# Patient Record
Sex: Female | Born: 1935
Health system: Southern US, Community
[De-identification: ages and names within clinical notes are randomized; demographics above are authoritative.]

## PROBLEM LIST (undated history)

## (undated) DIAGNOSIS — R569 Unspecified convulsions: Secondary | ICD-10-CM

## (undated) DIAGNOSIS — E7800 Pure hypercholesterolemia, unspecified: Secondary | ICD-10-CM

## (undated) HISTORY — PX: CYSTOSCOPY: SHX5120

## (undated) HISTORY — PX: ABDOMINAL HYSTERECTOMY: SHX81

## (undated) HISTORY — PX: BACK SURGERY: SHX140

## (undated) HISTORY — PX: HERNIA REPAIR: SHX51

## (undated) HISTORY — PX: CHOLECYSTECTOMY: SHX55

## (undated) HISTORY — PX: COLON RESECTION: SHX5231

---

## 2002-03-28 ENCOUNTER — Encounter: Payer: Self-pay | Admitting: Internal Medicine

## 2002-03-28 ENCOUNTER — Ambulatory Visit (HOSPITAL_COMMUNITY): Admission: RE | Admit: 2002-03-28 | Discharge: 2002-03-28 | Payer: Self-pay | Admitting: Internal Medicine

## 2002-04-12 ENCOUNTER — Ambulatory Visit (HOSPITAL_COMMUNITY): Admission: RE | Admit: 2002-04-12 | Discharge: 2002-04-12 | Payer: Self-pay | Admitting: Internal Medicine

## 2002-04-12 ENCOUNTER — Encounter: Payer: Self-pay | Admitting: Internal Medicine

## 2005-01-09 ENCOUNTER — Ambulatory Visit (HOSPITAL_COMMUNITY): Admission: RE | Admit: 2005-01-09 | Discharge: 2005-01-09 | Payer: Self-pay | Admitting: Family Medicine

## 2005-01-14 ENCOUNTER — Ambulatory Visit (HOSPITAL_COMMUNITY): Admission: RE | Admit: 2005-01-14 | Discharge: 2005-01-14 | Payer: Self-pay | Admitting: Family Medicine

## 2005-01-22 ENCOUNTER — Ambulatory Visit (HOSPITAL_COMMUNITY): Admission: RE | Admit: 2005-01-22 | Discharge: 2005-01-22 | Payer: Self-pay | Admitting: Family Medicine

## 2005-01-29 ENCOUNTER — Ambulatory Visit (HOSPITAL_COMMUNITY): Admission: RE | Admit: 2005-01-29 | Discharge: 2005-01-29 | Payer: Self-pay | Admitting: Family Medicine

## 2005-04-04 ENCOUNTER — Ambulatory Visit: Admission: RE | Admit: 2005-04-04 | Discharge: 2005-04-04 | Payer: Self-pay | Admitting: Gynecology

## 2005-04-11 ENCOUNTER — Ambulatory Visit (HOSPITAL_COMMUNITY): Admission: RE | Admit: 2005-04-11 | Discharge: 2005-04-11 | Payer: Self-pay | Admitting: Gynecology

## 2005-04-11 ENCOUNTER — Encounter (INDEPENDENT_AMBULATORY_CARE_PROVIDER_SITE_OTHER): Payer: Self-pay | Admitting: Specialist

## 2006-03-05 ENCOUNTER — Ambulatory Visit (HOSPITAL_COMMUNITY): Admission: RE | Admit: 2006-03-05 | Discharge: 2006-03-05 | Payer: Self-pay | Admitting: Family Medicine

## 2006-04-10 ENCOUNTER — Ambulatory Visit (HOSPITAL_COMMUNITY): Admission: RE | Admit: 2006-04-10 | Discharge: 2006-04-10 | Payer: Self-pay | Admitting: Neurological Surgery

## 2006-05-07 ENCOUNTER — Inpatient Hospital Stay (HOSPITAL_COMMUNITY): Admission: RE | Admit: 2006-05-07 | Discharge: 2006-05-15 | Payer: Self-pay | Admitting: Neurological Surgery

## 2007-04-05 ENCOUNTER — Ambulatory Visit (HOSPITAL_COMMUNITY): Admission: RE | Admit: 2007-04-05 | Discharge: 2007-04-05 | Payer: Self-pay | Admitting: Family Medicine

## 2008-04-06 ENCOUNTER — Ambulatory Visit (HOSPITAL_COMMUNITY): Admission: RE | Admit: 2008-04-06 | Discharge: 2008-04-06 | Payer: Self-pay | Admitting: Family Medicine

## 2008-07-13 IMAGING — CT CT L SPINE W/ CM
4 of 11 series · 12 of 35 positions shown, 13 images · IV contrast (omnipaque)
Comparison: none

CLINICAL DATA: Back pain and bilateral leg pain.
LUMBAR MYELOGRAM:
TECHNIQUE: Lumbar puncture was performed by Dr. Ferienhaus Erxleben.  L3-4 left paramedian approach, 22 gauge spinal needle.  15 cc Omnipaque 180.
TECHNIQUE: Multidetector CT imaging of the lumbar spine was performed after intrathecal injection of contrast.  Multiplanar CT image reconstructions were also generated.

[Series 2: l-spine helical · axial · 0.34mm/px · z∈[-297,-189]mm · 4 of 73 slices shown, 5 images]
[im 15/73  soft-tissue]
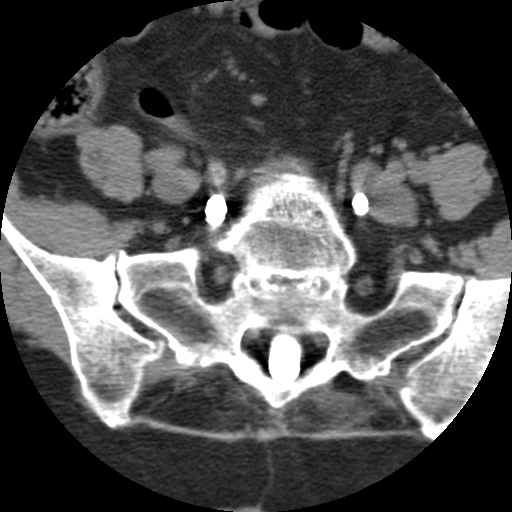
[im 15/73  bone]
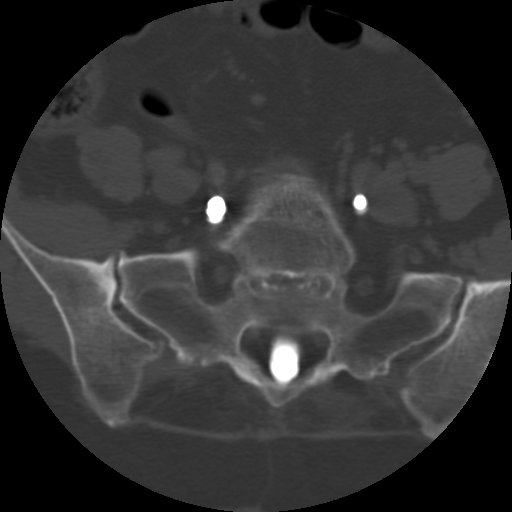
[im 29/73  bone]
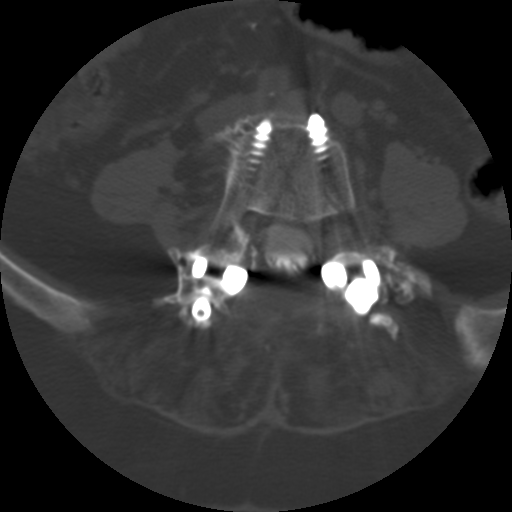
[im 44/73  bone]
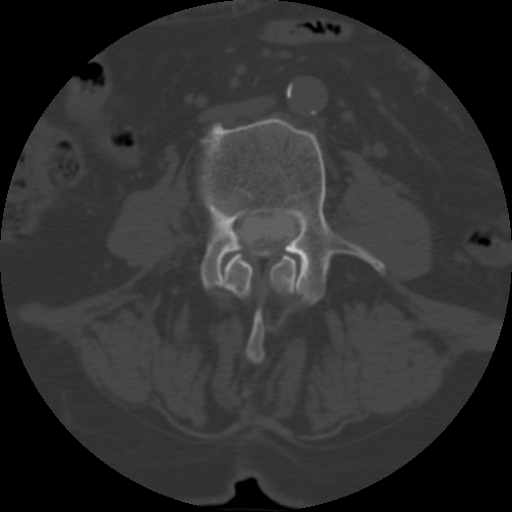
[im 58/73  bone]
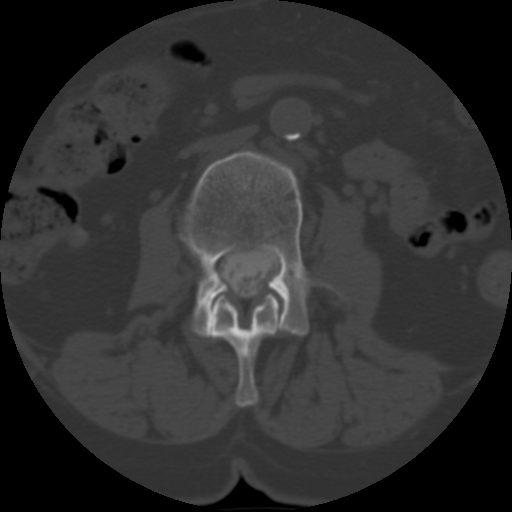

[Series 3: recon 2: l-spine helical · axial · 0.34mm/px · z∈[-287,-197]mm · 3 of 73 slices shown]
[im 19/73  bone]
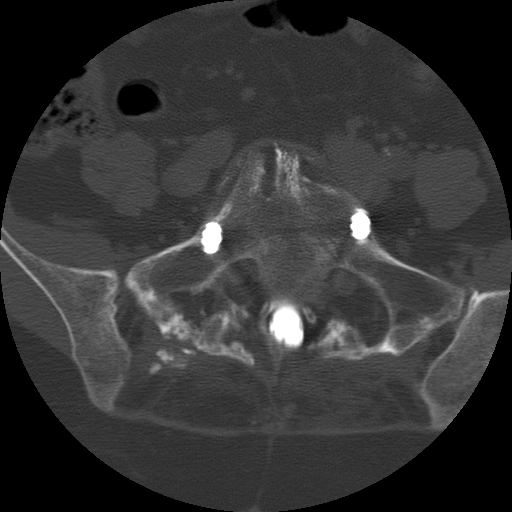
[im 37/73  bone]
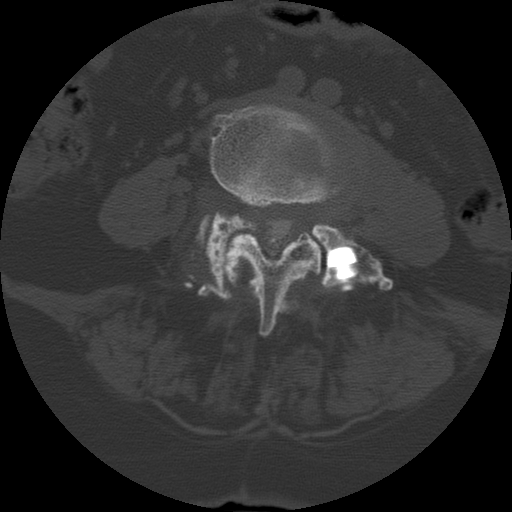
[im 55/73  bone]
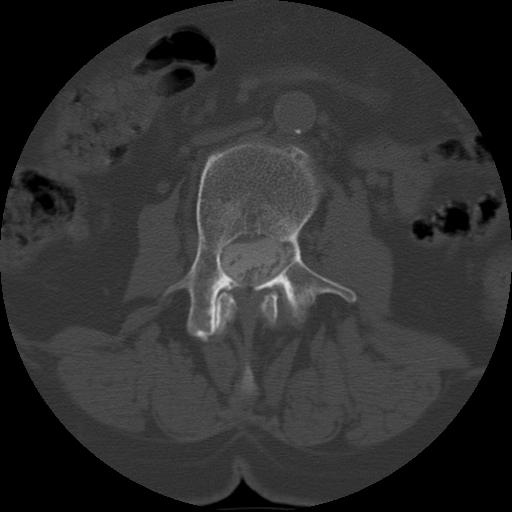

[Series 104: reformatted · coronal · 0.35mm/px · 2 of 50 slices shown (1 of 2)]
[im 11/50  soft-tissue]
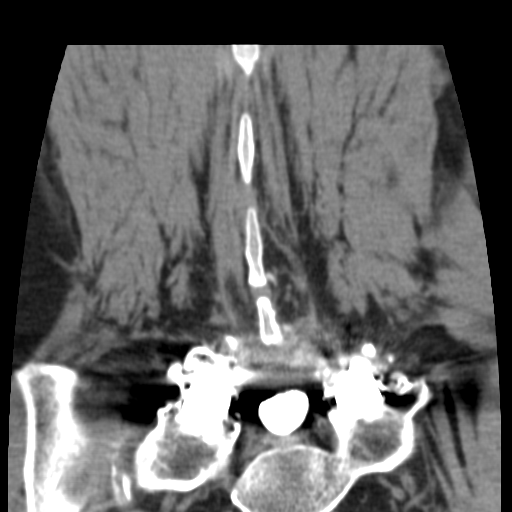
[im 25/50  bone]
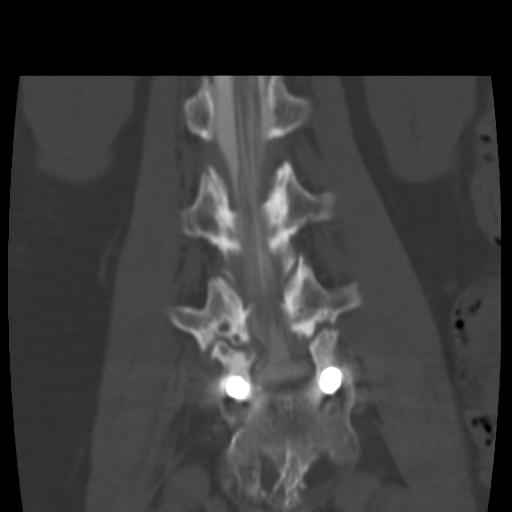

[Series 402: reformatted · coronal · 0.35mm/px · 3 of 56 slices shown (2 of 2)]
[im 3/56  bone]
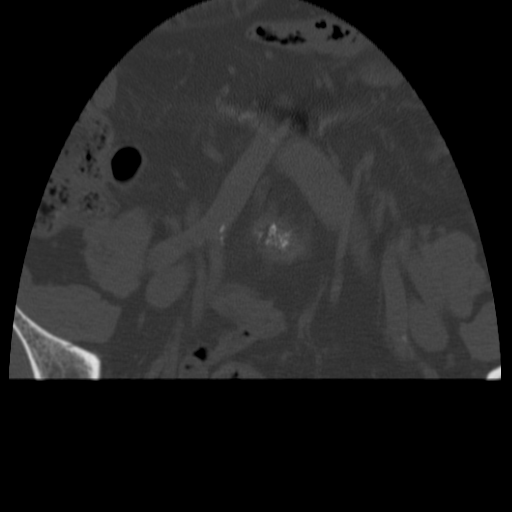
[im 16/56  bone]
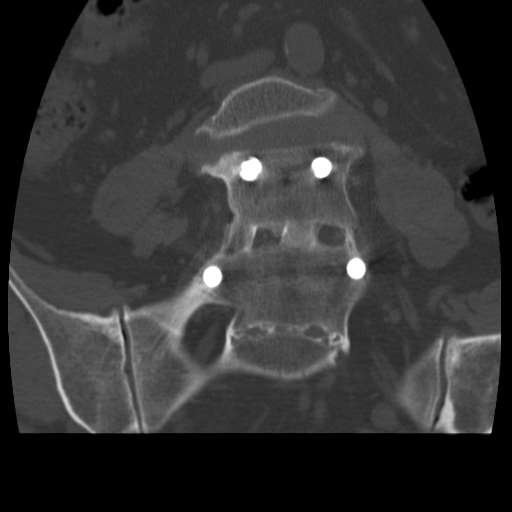
[im 30/56  bone]
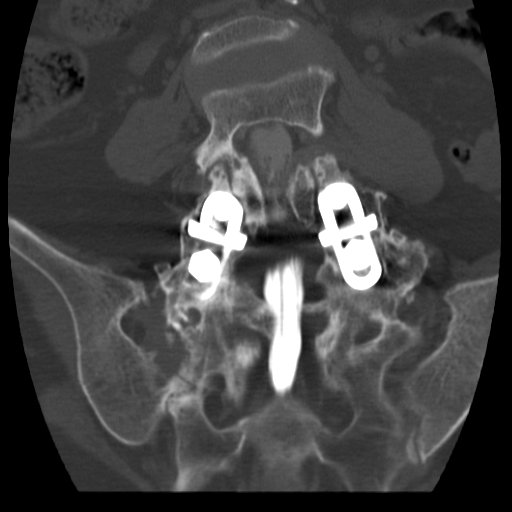

[12 of 35 positions shown; findings below may reference images not displayed]

FINDINGS: AP, lateral, and oblique views demonstrate previous L5-S1 fusion using pedicle screws and Le Fleur plates.  The hardware appears intact.  There is no evidence for nonunion.  
There is significant stenosis at L4-5 with bilateral L5 nerve root effacement.  This is due to a combination of mild slip as well as a large central disk protrusion.  Posterior element hypertrophy is prominently noted as well.  The disk spaces above this are normal.  There may be very mild anterolisthesis on forward flexion but there does not appear to be significant movement.
IMPRESSION: Solid fusion at L5-S1 with significant stenosis at the first motion segment above.
CT LUMBAR SPINE WITH CONTRAST (POST-MYELOGRAM):
FINDINGS: Transitional anatomy is present.  
L2-3:  The conus is slightly low.  Mild bulge.  
L3-4:  Mild bulge.  Mild facet arthropathy.
L4-5:  Central disk protrusion with posterior element hypertrophy.  Bilateral L4 and bilateral L5 nerve root encroachment are present.  
L5-S1:  Solid fusion.  No stenosis.  Pedicle screws are seen to extend throught the bone into the retroperitoneum.
IMPRESSION: 1.  Solid fusion at L5-S1.
2.  Central disk protrusion at L4-5 with posterior element hypertrophy; central canal stenosis is present along with bilateral L5 and bilateral L4 nerve root encroachment.

## 2008-07-13 IMAGING — RF DG MYELOGRAM LUMBAR
13 series · 13 of 13 positions shown · IV contrast (omnipaque)
Comparison: none

CLINICAL DATA: Back pain and bilateral leg pain.
LUMBAR MYELOGRAM:
TECHNIQUE: Lumbar puncture was performed by Dr. Ferienhaus Erxleben.  L3-4 left paramedian approach, 22 gauge spinal needle.  15 cc Omnipaque 180.
TECHNIQUE: Multidetector CT imaging of the lumbar spine was performed after intrathecal injection of contrast.  Multiplanar CT image reconstructions were also generated.

[Series 1: run · 1 of 1 slices shown (1 of 13)]
[im 1/1]
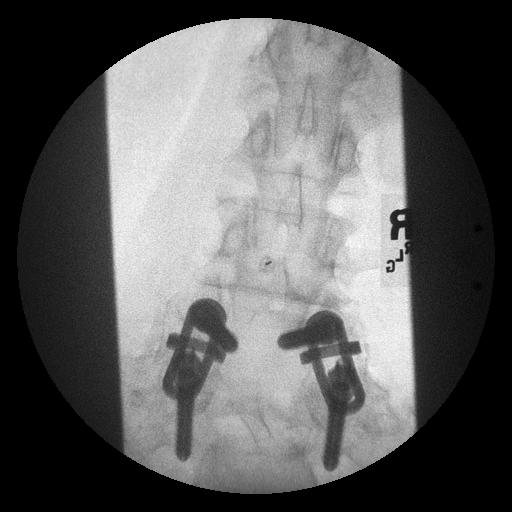

[Series 2: run · 1 of 1 slices shown (2 of 13)]
[im 1/1]
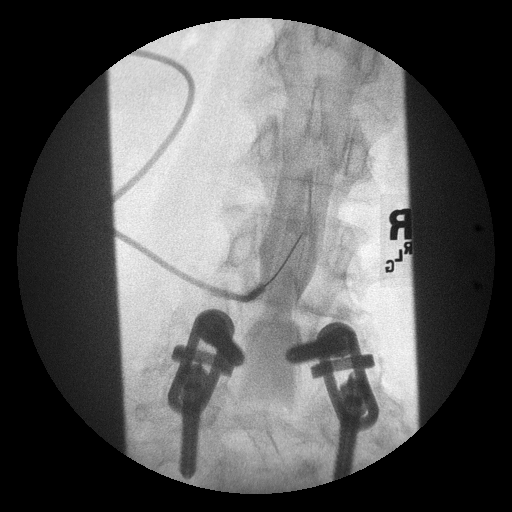

[Series 3: run · 1 of 1 slices shown (3 of 13)]
[im 1/1]
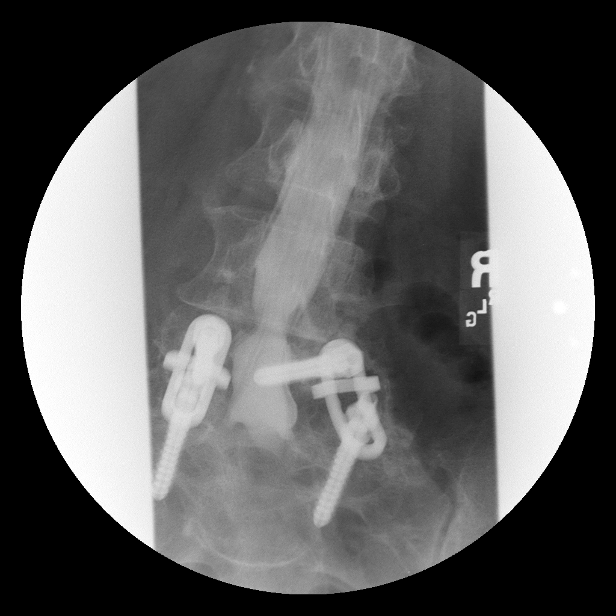

[Series 4: run · 1 of 1 slices shown (4 of 13)]
[im 1/1]
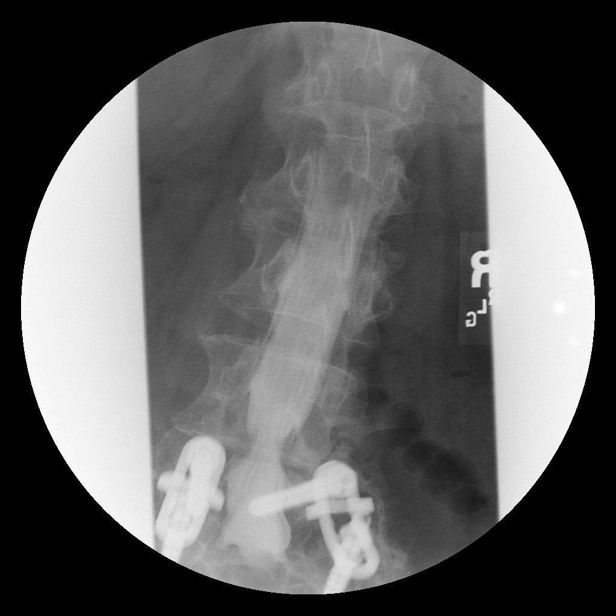

[Series 5: run · 1 of 1 slices shown (5 of 13)]
[im 1/1]
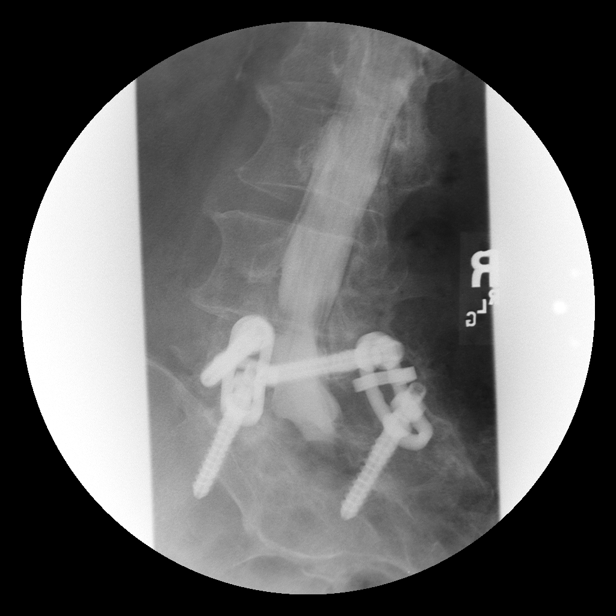

[Series 6: run · 1 of 1 slices shown (6 of 13)]
[im 1/1]
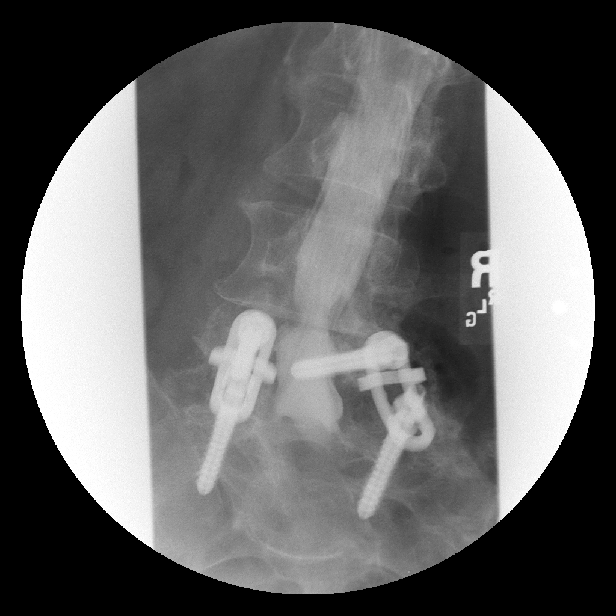

[Series 7: run · 1 of 1 slices shown (7 of 13)]
[im 1/1]
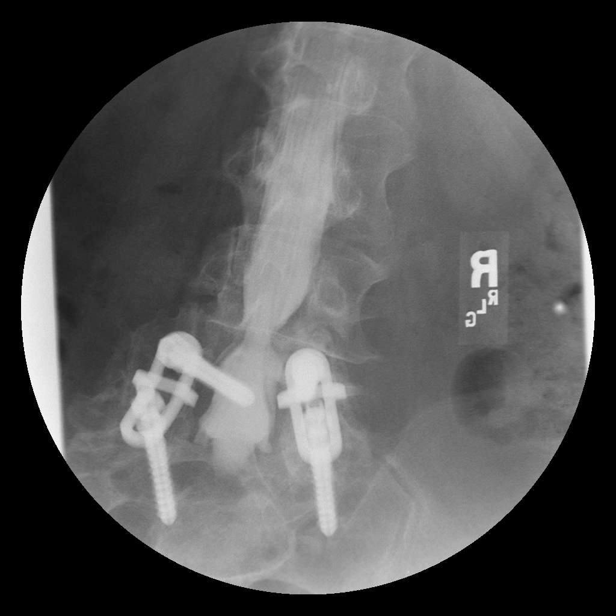

[Series 8: run · 1 of 1 slices shown (8 of 13)]
[im 1/1]
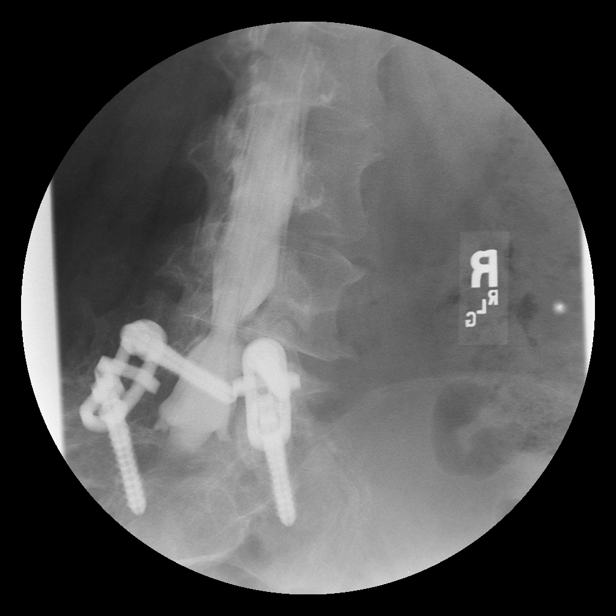

[Series 9: run · 1 of 1 slices shown (9 of 13)]
[im 1/1]
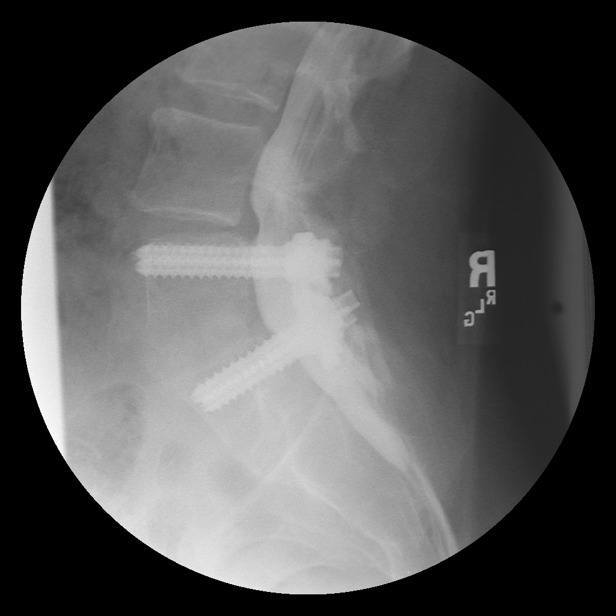

[Series 10: run · 1 of 1 slices shown (10 of 13)]
[im 1/1]
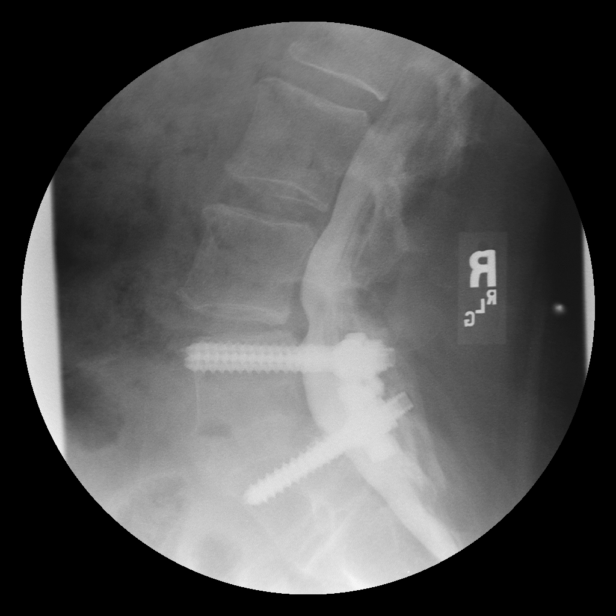

[Series 11: run · 1 of 1 slices shown (11 of 13)]
[im 1/1]
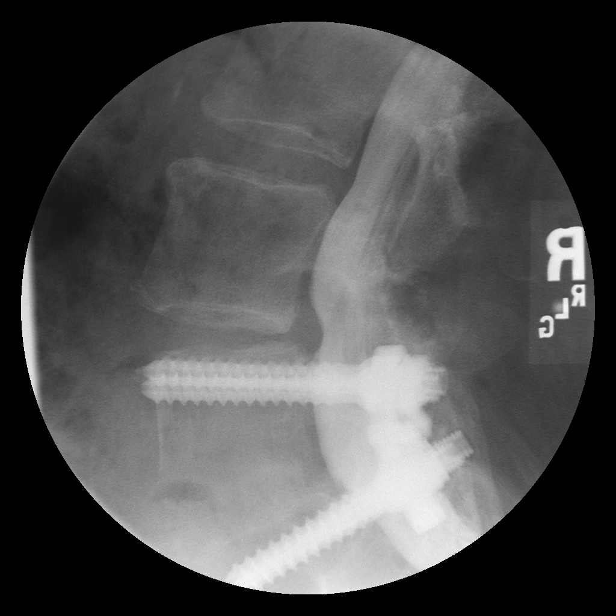

[Series 12: run · 1 of 1 slices shown (12 of 13)]
[im 1/1]
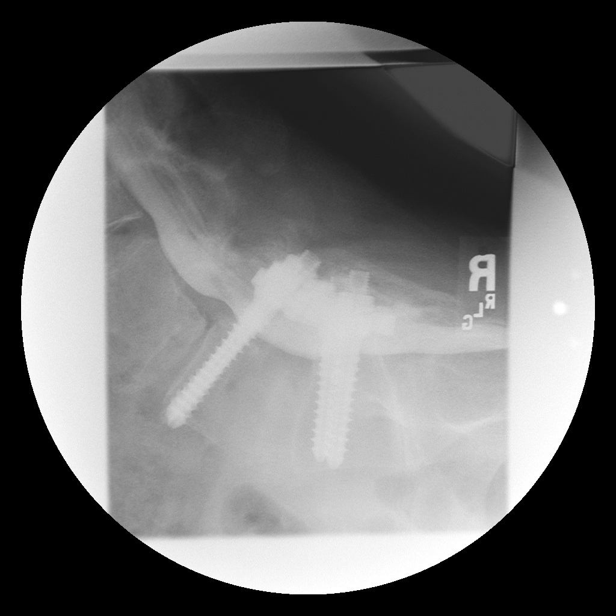

[Series 13: run · 1 of 1 slices shown (13 of 13)]
[im 1/1]
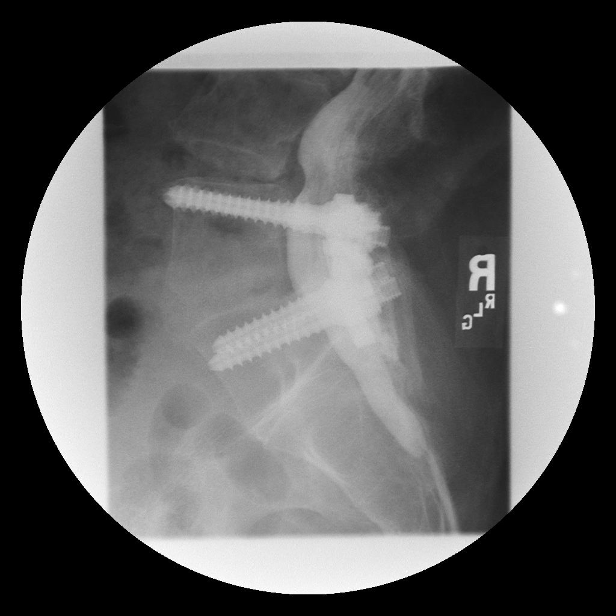

[13 of 13 positions shown; findings below may reference images not displayed]

FINDINGS: AP, lateral, and oblique views demonstrate previous L5-S1 fusion using pedicle screws and Le Fleur plates.  The hardware appears intact.  There is no evidence for nonunion.  
There is significant stenosis at L4-5 with bilateral L5 nerve root effacement.  This is due to a combination of mild slip as well as a large central disk protrusion.  Posterior element hypertrophy is prominently noted as well.  The disk spaces above this are normal.  There may be very mild anterolisthesis on forward flexion but there does not appear to be significant movement.
IMPRESSION: Solid fusion at L5-S1 with significant stenosis at the first motion segment above.
CT LUMBAR SPINE WITH CONTRAST (POST-MYELOGRAM):
FINDINGS: Transitional anatomy is present.  
L2-3:  The conus is slightly low.  Mild bulge.  
L3-4:  Mild bulge.  Mild facet arthropathy.
L4-5:  Central disk protrusion with posterior element hypertrophy.  Bilateral L4 and bilateral L5 nerve root encroachment are present.  
L5-S1:  Solid fusion.  No stenosis.  Pedicle screws are seen to extend throught the bone into the retroperitoneum.
IMPRESSION: 1.  Solid fusion at L5-S1.
2.  Central disk protrusion at L4-5 with posterior element hypertrophy; central canal stenosis is present along with bilateral L5 and bilateral L4 nerve root encroachment.

## 2009-05-11 ENCOUNTER — Ambulatory Visit (HOSPITAL_COMMUNITY): Admission: RE | Admit: 2009-05-11 | Discharge: 2009-05-11 | Payer: Self-pay | Admitting: Family Medicine

## 2010-04-25 ENCOUNTER — Ambulatory Visit (HOSPITAL_COMMUNITY)
Admission: RE | Admit: 2010-04-25 | Discharge: 2010-04-25 | Payer: Self-pay | Source: Home / Self Care | Admitting: Ophthalmology

## 2010-05-15 ENCOUNTER — Emergency Department (HOSPITAL_COMMUNITY)
Admission: RE | Admit: 2010-05-15 | Discharge: 2010-05-15 | Payer: Self-pay | Source: Home / Self Care | Attending: Ophthalmology | Admitting: Ophthalmology

## 2010-06-03 ENCOUNTER — Ambulatory Visit (HOSPITAL_COMMUNITY)
Admission: RE | Admit: 2010-06-03 | Discharge: 2010-06-03 | Payer: Self-pay | Source: Home / Self Care | Attending: Family Medicine | Admitting: Family Medicine

## 2010-06-26 ENCOUNTER — Encounter (HOSPITAL_COMMUNITY): Payer: Medicare Other | Attending: Ophthalmology

## 2010-06-27 ENCOUNTER — Ambulatory Visit (HOSPITAL_COMMUNITY)
Admission: RE | Admit: 2010-06-27 | Discharge: 2010-06-27 | Disposition: A | Discharge status: Home / Self Care | Payer: Medicare Other | Attending: Ophthalmology | Admitting: Ophthalmology

## 2010-06-27 DIAGNOSIS — Z7982 Long term (current) use of aspirin: Secondary | ICD-10-CM | POA: Insufficient documentation

## 2010-06-27 DIAGNOSIS — H2510 Age-related nuclear cataract, unspecified eye: Secondary | ICD-10-CM | POA: Insufficient documentation

## 2010-06-27 DIAGNOSIS — Z01810 Encounter for preprocedural cardiovascular examination: Secondary | ICD-10-CM | POA: Insufficient documentation

## 2010-08-05 LAB — DIFFERENTIAL
Eosinophils Absolute: 0.1 10*3/uL (ref 0.0–0.7)
Lymphocytes Relative: 24 % (ref 12–46)
Lymphs Abs: 1.5 10*3/uL (ref 0.7–4.0)
Monocytes Relative: 6 % (ref 3–12)
Neutro Abs: 4.1 10*3/uL (ref 1.7–7.7)
Neutrophils Relative %: 68 % (ref 43–77)

## 2010-08-05 LAB — BASIC METABOLIC PANEL
BUN: 12 mg/dL (ref 6–23)
Calcium: 9.4 mg/dL (ref 8.4–10.5)
Chloride: 104 mEq/L (ref 96–112)
Creatinine, Ser: 0.8 mg/dL (ref 0.4–1.2)
GFR calc non Af Amer: >60 mL/min (ref >60)
Glucose, Bld: 129 mg/dL — ABNORMAL HIGH (ref 70–99)
Sodium: 138 mEq/L (ref 135–145)

## 2010-08-05 LAB — CBC
Hemoglobin: 13.3 g/dL (ref 12.0–15.0)
MCV: 93.4 fL (ref 78.0–100.0)
Platelets: 220 10*3/uL (ref 150–400)
WBC: 6 10*3/uL (ref 4.0–10.5)

## 2010-08-05 LAB — POCT CARDIAC MARKERS
CKMB, poc: <1 ng/mL — ABNORMAL LOW (ref 1.0–8.0)
CKMB, poc: <1 ng/mL — ABNORMAL LOW (ref 1.0–8.0)
Myoglobin, poc: 75 ng/mL (ref 12–200)
Myoglobin, poc: 87.5 ng/mL (ref 12–200)
Troponin i, poc: <0.05 ng/mL (ref 0.00–0.09)

## 2010-08-06 LAB — HEMOGLOBIN AND HEMATOCRIT, BLOOD: HCT: 39.2 % (ref 36.0–46.0)

## 2010-08-06 LAB — BASIC METABOLIC PANEL
BUN: 15 mg/dL (ref 6–23)
CO2: 26 mEq/L (ref 19–32)
Calcium: 9.5 mg/dL (ref 8.4–10.5)
GFR calc non Af Amer: >60 mL/min (ref >60)
Glucose, Bld: 117 mg/dL — ABNORMAL HIGH (ref 70–99)
Sodium: 137 mEq/L (ref 135–145)

## 2010-10-11 NOTE — Consult Note (Signed)
NAME:  Gloria Owens, Gloria Owens NO.:  [id number]   MEDICAL RECORD NO.:  [id number]          PATIENT TYPE:  OUT   LOCATION:  GYN                          FACILITY:  [facility]   PHYSICIAN:  De Blanch, M.D.DATE OF BIRTH:  1935-07-31   DATE OF CONSULTATION:  04/04/2005  DATE OF DISCHARGE:                                   CONSULTATION   CHIEF COMPLAINT:  Pelvic cyst.   HISTORY OF PRESENT ILLNESS:  A 75 year old white married female seen in  consultation at the request of Dr. Marlane Hatcher regarding management  of a newly diagnosed pelvic cyst.  The cyst was discovered in the course of  performing an MRI to evaluate the patient's spine.  Further evaluation has  included a CAT scan which showed the cyst to be a simple fluid collection  measuring 9.8 x 5.5 x 9.2 cm.  There is no evidence of free fluid or  adenopathy.  It does cause a mass effect on the bladder, but is clearly  separate from the bladder.   The patient has a complex gynecologic history in that she underwent  exploratory laparotomy in 1967 and was found to have extensive  endometriosis.  The extent of that surgical procedure is unknown.  However,  in 1970 she underwent exploratory laparotomy with total abdominal  hysterectomy and a sigmoid resection with reanastomosis.  The patient did  not develop menopausal symptoms at that time (she was 76 years of age) and  therefore it would seem reasonable to assume that her ovaries were not  removed.  Operative notes and details of that surgical procedure are not  available.  She has previously had attempts at colonoscopy which have been  unsuccessful because of stenosis.  She claims that the sigmoid colon is no  larger than a English pea and therefore has been evaluated by barium  enemas only.   The patient does have chronic left lower quadrant pain and chronic  constipation.   She denies any hormone replacement therapy use or any other current or past  gynecologic symptoms.   PAST MEDICAL HISTORY:   MEDICAL ILLNESSES:  None.  She does have a remote history of epilepsy which  she said has resolved spontaneously.   PAST SURGICAL HISTORY:  1.  TAH 1970.  2.  Sigmoid resection with reanastomosis 1970.  3.  Open cholecystectomy 1973.  4.  She had bladder polyps which were cauterized.  5.  An inguinal hernia was repaired in 1993.  6.  She has had back surgery in 1995.   CURRENT MEDICATIONS:  Crestor and Vicodin.   ALLERGIES:  None.   FAMILY HISTORY:  Negative for gynecologic, breast, or colon cancer.  Several  of her brothers died of lung cancer but they were smokers.   SOCIAL HISTORY:  The patient has been married for 47 years.  She does not  smoke.  Previously worked in a [employer].   REVIEW OF SYSTEMS:  10-point comprehensive review of systems is negative  except for symptoms noted above.   PHYSICAL EXAMINATION:  VITAL SIGNS:  Height 5 feet 4  inches, weight 144  pounds, blood pressure 148/76, pulse 80.  GENERAL:  Patient is a healthy, pleasant white female in no acute distress.  HEENT:  Negative.  NECK:  Supple without thyromegaly.  LYMPH:  There is no supraclavicular or inguinal adenopathy.  ABDOMEN:  Soft and nontender.  She has a well healed midline and right  subcostal scars.  No masses or hernias, ascites, or organomegaly is noted.  PELVIC:  EGBUS, vagina, bladder, urethra are normal, slightly atrophic.  No  lesions are noted.  Bimanual and rectovaginal examination reveals some  slight tenderness without any discrete masses.   The patient's MRI and CT scan are reviewed and are consistent with the  reports noted in my history of present illness.   IMPRESSION:  Large simple cyst of the pelvis most likely a peritoneal  inclusion cyst given the extent of her prior pelvic operations.   Apparent sigmoid stenosis which may be contributing to her left lower  quadrant pain.   I had a lengthy discussion with the patient  and her husband regarding the  differential diagnosis of this mass.  Not only it is simple but we are  reassured further because her CA-125 value was 8.3 units/mL.   Options that were discussed included observation versus percutaneous  drainage versus laparotomy.  I do not think laparotomy is a good choice  given the low likelihood of this being a significant problem and the high  probability of extensive adhesions and potential for serious postoperative  complications.  After discussing observation versus aspiration the patient  wishes to go ahead with interventional radiology draining this cyst.  We  will send for fluid for  cytopathology.  The patient is informed that while the cyst may diminish in  size after drainage it may likely reoccur.  If there is any evidence of  malignancy, certainly surgical intervention would be reconsidered.   We will arrange for her to have fine needle aspiration of this cyst next  week.      De Blanch, M.D.  Electronically Signed     DC/MEDQ  D:  04/04/2005  T:  04/04/2005  Job:  [id number]   cc:   Melvyn Novas, MD  Fax: [phone]   Telford Nab, R.N.  [address]  [place], [place] [address]   Barbaraann Barthel, M.D.  Fax: [phone]

## 2010-10-11 NOTE — Op Note (Signed)
NAME:  Gloria, Owens NO.:  [id number]   MEDICAL RECORD NO.:  [id number]          PATIENT TYPE:  INP   LOCATION:  3105                         FACILITY:  [facility]   PHYSICIAN:  Stefani Dama, M.D.  DATE OF BIRTH:  07-Sep-1935   DATE OF PROCEDURE:  05/07/2006  DATE OF DISCHARGE:                               OPERATIVE REPORT   PREOPERATIVE DIAGNOSIS:  Spondylosis L4-L5 with lumbar stenosis, lumbar  radiculopathy, neurogenic claudication, status post arthrodesis L5-S1.   POSTOPERATIVE DIAGNOSIS:  Spondylosis L4-L5 with lumbar stenosis, lumbar  radiculopathy, neurogenic claudication, status post arthrodesis L5-S1.   PROCEDURE:  Removal of hardware L5-S1, laminectomy of L4, posterior  lumbar interbody arthrodesis after bilateral discectomy L4-L5, PEEK  spacers, local autograft and allograft arthrodesis, pedicle screw  fixation L4 to L5 with posterolateral arthrodesis, local autograft and  allograft.   SURGEON:  Stefani Dama, M.D.   FIRST ASSISTANT:  Cristi Loron, M.D.   ANESTHESIA:  General endotracheal anesthesia.   INDICATIONS:  Gloria Owens is a 75 year old individual who has had  previous problems with significant back pain and lower extremity pain.  She had undergone an arthrodesis at L5-S1 in the past and did well for a  number of years, but more recently developed bilateral lower extremity  pain. On a post myelogram CAT scan, she was noted to have severe  bilateral lateral compression of the exiting L4 nerve root superiorly  secondary to spondylosis and overgrowth of the facets at the L4-L5  joint.  Because she has failed efforts at conservative management, now  for a long period of time, she was advised regarding surgical  decompression and arthrodesis for removal of the previously placed  hardware.  She is taken to the operating room to undergo this procedure.   DESCRIPTION OF PROCEDURE:  The patient was brought to the operating room  supine  on the stretcher. After the smooth induction of general  endotracheal anesthesia, a Foley catheter was placed.  Care was taken to  turn her prone and the bony prominences were appropriately padded and  protected.  An elliptical incision was made in the midline after  prepping the skin with DuraPrep and draping sterilely.  A scar from the  previous surgery was removed.  Dissection was carried down to the  lumbodorsal fascia.  The fascia was opened on either side of the midline  to expose the spinous process of L4 and then dissection was carried down  in the posterolateral gutters to expose the L5-S1 hardware.  The  hardware was uncovered and it was noted to be consistent with hardware  that had been used some time ago.  Her previous fusion was done 1995.  The hardware was exposed very meticulously as bone had grown all around  and over the hardware, that required use of an osteotome to remove  fragments of bone from the screw heads and to expose the screws and the  lateral rods and plates adequately. Once this was achieved, the screw  nesting nuts could be loosened and the hardware was removed.   Attention was then turned  to L4 where a laminectomy of L4 was  undertaken.  This exposed the common dural tube. The laminectomy was  taken out laterally to expose the facet joints and a complete  facetectomy of the inferior facet of L4 was performed.  This exposed the  lateral aspect of the common dural tube and takeoff of the L5 nerve root  inferiorly and the L4 nerve root superiorly.  There was noted to be a  substantial amount of thickened grumous material in this area  compressing the L4 nerve roots.  This was particularly worse on the  right side than it was on the left. With these nerve roots being  decompressed, the epidural space was dissected with bipolar cautery  being used to cauterize the epidural veins. This allowed for retraction  of the common dural tube.  The dissection was then  performed so as to  retract the common dural tube and the L5 nerve root inferiorly.  The  disc space was exposed and a 15 blade was used to incise the disc space.  A combination of Kerrison rongeurs was used to evacuate a significant  quantity of severely degenerated disc material and a series of disc  shapers was then used in the L4-L5 interspace to remove cartilaginous  endplates. This was first done on the right side then done also on the  left side.  This space was completely evacuated of all degenerated  cartilaginous material and the endplates were shaved completely after  being spread to 9 mm size. 10 mm PEEK interbody spacers were then filled  with bone graft. Bone graft was then packed into the interbody space.  The bone graft consisted of the autograft from the laminectomy which was  mixed with 5 mL of Osteocel and also 10 mL of Alphagrain was used as a  bone expander.  An aspirate of the bone marrow from the pedicle of L4 on  that left side was obtained and this was used to mix the Alphagrain.  This material was placed in the interspace at L4-L5 and the PEEK spacer  was placed, first on the right then on the left. The remainder of the  disc space was then packed with the additional bone.  The lateral  gutters were then decorticated from the intertransverse space at L4 and  L5.  This was packed with the remainder of the autologous bone.  The  previously placed L5 screws were then used to place 7.5 x 45 mm pedicle  screws on the left then on the right. The pedicles of L4 were noted to  be rather small, 5.5 x 45 mm screws were placed here using fluoroscopic  guidance. The screws were connected with short 35 mm pre-contoured rods,  slight bit of extension was placed on the construct.  With the  posterolateral gutters being filled with bone graft, hemostasis in the  soft tissues was obtained meticulously and then the lumbodorsal fascia was reapproximated with #1 Vicryl in an  interrupted fashion, 2-0 Vicryl  was used in the subcutaneous tissues, 3-0 Vicryl subcuticularly.  Dry  sterile dressing was placed on the patient's back.  Blood loss for this  procedure was estimated at about 900 mL.  300 mL of Cell Saver blood was  returned to the patient.      Stefani Dama, M.D.  Electronically Signed     HJE/MEDQ  D:  05/07/2006  T:  05/07/2006  Job:  [id number]

## 2011-06-17 ENCOUNTER — Other Ambulatory Visit (HOSPITAL_COMMUNITY): Payer: Self-pay | Admitting: Family Medicine

## 2011-06-17 DIAGNOSIS — Z139 Encounter for screening, unspecified: Secondary | ICD-10-CM

## 2011-06-23 ENCOUNTER — Ambulatory Visit (HOSPITAL_COMMUNITY)
Admission: RE | Admit: 2011-06-23 | Discharge: 2011-06-23 | Disposition: A | Discharge status: Home / Self Care | Payer: Medicare Other | Source: Ambulatory Visit | Attending: Family Medicine | Admitting: Family Medicine

## 2011-06-23 DIAGNOSIS — Z1231 Encounter for screening mammogram for malignant neoplasm of breast: Secondary | ICD-10-CM | POA: Insufficient documentation

## 2011-06-23 DIAGNOSIS — Z139 Encounter for screening, unspecified: Secondary | ICD-10-CM

## 2011-06-27 ENCOUNTER — Other Ambulatory Visit: Payer: Self-pay | Admitting: Physician Assistant

## 2011-06-27 DIAGNOSIS — R928 Other abnormal and inconclusive findings on diagnostic imaging of breast: Secondary | ICD-10-CM

## 2011-07-09 ENCOUNTER — Other Ambulatory Visit: Payer: Self-pay | Admitting: Physician Assistant

## 2011-07-09 ENCOUNTER — Ambulatory Visit (HOSPITAL_COMMUNITY)
Admission: RE | Admit: 2011-07-09 | Discharge: 2011-07-09 | Disposition: A | Discharge status: Home / Self Care | Payer: Medicare Other | Source: Ambulatory Visit | Attending: Physician Assistant | Admitting: Physician Assistant

## 2011-07-09 DIAGNOSIS — R928 Other abnormal and inconclusive findings on diagnostic imaging of breast: Secondary | ICD-10-CM | POA: Insufficient documentation

## 2011-07-09 DIAGNOSIS — N6320 Unspecified lump in the left breast, unspecified quadrant: Secondary | ICD-10-CM

## 2011-07-16 ENCOUNTER — Other Ambulatory Visit: Payer: Self-pay | Admitting: Physician Assistant

## 2011-07-16 ENCOUNTER — Ambulatory Visit (HOSPITAL_COMMUNITY)
Admission: RE | Admit: 2011-07-16 | Discharge: 2011-07-16 | Disposition: A | Discharge status: Home / Self Care | Payer: Medicare Other | Source: Ambulatory Visit | Attending: Physician Assistant | Admitting: Physician Assistant

## 2011-07-16 DIAGNOSIS — N6009 Solitary cyst of unspecified breast: Secondary | ICD-10-CM | POA: Insufficient documentation

## 2011-07-16 DIAGNOSIS — N6320 Unspecified lump in the left breast, unspecified quadrant: Secondary | ICD-10-CM

## 2011-07-16 NOTE — Discharge Instructions (Signed)
Breast Biopsy WHY YOU NEED A BIOPSY Your caregiver has recommended that you have a breast tissue sample taken (biopsy). This is done to be certain that the lump or abnormality found in your breast is not cancerous (malignant). During a biopsy, a small piece of tissue is removed, so it can be examined under a microscope by a specialist (pathologist) who looks at tissues and cells and diagnoses abnormalities in them. Most lumps (tumors) or abnormalities, on or in the breast, are not cancerous (benign). However, biopsies are taken when your caregiver cannot be absolutely certain of what is wrong only from doing a physical exam, mammogram (breast X-ray), or other studies. A breast biopsy can tell you whether nothing more needs to be done, or you need more surgery or another type of treatment. A biopsy is done when there is:  Any undiagnosed breast mass.   Nipple abnormalities, dimpling, crusting, or ulcerations.   Calcium deposits (calcifications) or abnormalities seen on your mammogram, ultrasound, or MRI.   Suspicious changes in the breast (thickening, asymmetry) seen on mammogram.   Abnormal discharge from the nipple, especially blood.   Redness, swelling, and pain of the breast.  HOW A BIOPSY IS PERFORMED A biopsy is often performed on an outpatient basis (you go home the same day). This can be done in a hospital, clinic, or surgical center. Tissue samples (biopsies) are often done under local anesthesia (area is numbed). Sometimes general anesthetics are required, in which case you sleep through the procedure. Biopsies may remove the entire lump, a small piece of the lump, or a small sliver of tissue removed by needle. TYPES OF BREAST BIOPSY  Fine needle aspiration. A thin needle is placed through the skin, to the lump or cyst, and cells are removed.   Core needle biopsy. A large needle with a special tip is placed through the skin, to the abnormality, and a piece of tissue is removed.    Stereotactic biopsy. A core needle with a special X-ray is used, to direct the needle to the lump or abnormal area, which is difficult to feel or cannot be felt.   Vacuum-assisted biopsy. A hollow probe and a gentle vacuum remove a sample of tissue.   Ultrasound guided core needle biopsy. You lie on your stomach, with your breast through an opening, and a high frequency ultrasound helps guide the needle to the area of the abnormality.   Open biopsy. An incision is made in the breast, and a piece of the lump or the whole lump is removed.  LET YOUR CAREGIVER KNOW ABOUT:  Allergies.   Medicines taken, including herbs, eye drops, over-the-counter medicines, and creams.   Use of steroids (by mouth or creams).   Previous problems with anesthetics or Novocaine.   If you are taking aspirin or blood thinners.   Possibility of pregnancy, if this applies.   History of blood clots (thrombophlebitis).   History of bleeding or blood problems.   Previous surgery.   Other health problems.  RISKS AND COMPLICATIONS   Bleeding.   Infection.   Allergy to medicines.   Bruising and swelling of the breast.   Alteration in the shape of the breast.   Not finding the lump or abnormality.   Needing more surgery.  BEFORE THE PROCEDURE  You should arrive 60 minutes prior to your procedure or as directed.   Check-in at the admissions desk, to fill out necessary forms, if you are not preregistered.   There will be consent forms   to sign, prior to the procedure.   There is a waiting area for your family, while you are having your biopsy.   Try to have someone with you, to drive you home.   Do not smoke for 2 weeks before the surgery.   Let your caregiver know if you develop a cold or an infection.   Do not drink alcohol for at least 24 hours before surgery.   Wear a good support bra to the surgery.  AFTER THE PROCEDURE  After surgery, you will be taken to the recovery area, where a  nurse will watch and check your progress. Once you are awake, stable, and taking fluids well, if there are no other problems, you will be allowed to go home.   Ice packs applied to your operative site may help with discomfort and keep the swelling down.   You may resume normal diet and activities as directed. Avoid strenuous activities affecting the arm on the side of the biopsy, such as tennis, swimming, heavy lifting (more than 10 pounds) or pulling.   Bruising in the breast is normal following this procedure.   Wearing a support bra, even to bed, may be more comfortable. The bra will also help keep the dressing on.   Change dressings as directed.   Your doctor may apply a pressure dressing on your breast for 24 to 48 hours.   Only take over-the-counter or prescription medicines for pain, discomfort, or fever as directed by your caregiver.   Do not take aspirin, because it can cause bleeding.  HOME CARE INSTRUCTIONS   You may resume your usual diet.   Have someone drive you home after the surgery.   Do not do any exercise, driving, lifting or general activities without your caregiver's permission.   Take medicines and over-the-counter medicines, as ordered by your caregiver.   Keep your postoperative appointments as recommended.   Do not drink alcohol while taking pain medicine.  Finding out the results of your test Not all test results are available during your visit. If your test results are not back during the visit, make an appointment with your caregiver to find out the results. Do not assume everything is normal if you have not heard from your caregiver or the medical facility. It is important for you to follow up on all of your test results.  SEEK MEDICAL CARE IF:   You notice redness, swelling, or increasing pain in the wound.   You notice a bad smell coming from the wound or dressing.   You develop a rash.   You need stronger pain medicine.   You are having an  allergic reaction or problems with your medicines.  SEEK IMMEDIATE MEDICAL CARE IF:   You have difficulty breathing.   You have a fever.   There is increased bleeding (more than a small spot) from the wound.   Pus is coming from the wound.   The wound is breaking open.  Document Released: 05/12/2005 Document Revised: 01/22/2011 Document Reviewed: 03/30/2009 Exit[employer] Patient Information 2012 ExitCare, LLCBreast Biopsy WHY YOU NEED A BIOPSY Your caregiver has recommended that you have a breast tissue sample taken (biopsy). This is done to be certain that the lump or abnormality found in your breast is not cancerous (malignant). During a biopsy, a small piece of tissue is removed, so it can be examined under a microscope by a specialist (pathologist) who looks at tissues and cells and diagnoses abnormalities in them. Most lumps (tumors) or  abnormalities, on or in the breast, are not cancerous (benign). However, biopsies are taken when your caregiver cannot be absolutely certain of what is wrong only from doing a physical exam, mammogram (breast X-ray), or other studies. A breast biopsy can tell you whether nothing more needs to be done, or you need more surgery or another type of treatment. A biopsy is done when there is:  Any undiagnosed breast mass.   Nipple abnormalities, dimpling, crusting, or ulcerations.   Calcium deposits (calcifications) or abnormalities seen on your mammogram, ultrasound, or MRI.   Suspicious changes in the breast (thickening, asymmetry) seen on mammogram.   Abnormal discharge from the nipple, especially blood.   Redness, swelling, and pain of the breast.  HOW A BIOPSY IS PERFORMED A biopsy is often performed on an outpatient basis (you go home the same day). This can be done in a hospital, clinic, or surgical center. Tissue samples (biopsies) are often done under local anesthesia (area is numbed). Sometimes general anesthetics are required, in which case you  sleep through the procedure. Biopsies may remove the entire lump, a small piece of the lump, or a small sliver of tissue removed by needle. TYPES OF BREAST BIOPSY  Fine needle aspiration. A thin needle is placed through the skin, to the lump or cyst, and cells are removed.   Core needle biopsy. A large needle with a special tip is placed through the skin, to the abnormality, and a piece of tissue is removed.   Stereotactic biopsy. A core needle with a special X-ray is used, to direct the needle to the lump or abnormal area, which is difficult to feel or cannot be felt.   Vacuum-assisted biopsy. A hollow probe and a gentle vacuum remove a sample of tissue.   Ultrasound guided core needle biopsy. You lie on your stomach, with your breast through an opening, and a high frequency ultrasound helps guide the needle to the area of the abnormality.   Open biopsy. An incision is made in the breast, and a piece of the lump or the whole lump is removed.  LET YOUR CAREGIVER KNOW ABOUT:  Allergies.   Medicines taken, including herbs, eye drops, over-the-counter medicines, and creams.   Use of steroids (by mouth or creams).   Previous problems with anesthetics or Novocaine.   If you are taking aspirin or blood thinners.   Possibility of pregnancy, if this applies.   History of blood clots (thrombophlebitis).   History of bleeding or blood problems.   Previous surgery.   Other health problems.  RISKS AND COMPLICATIONS   Bleeding.   Infection.   Allergy to medicines.   Bruising and swelling of the breast.   Alteration in the shape of the breast.   Not finding the lump or abnormality.   Needing more surgery.  BEFORE THE PROCEDURE  You should arrive 60 minutes prior to your procedure or as directed.   Check-in at the admissions desk, to fill out necessary forms, if you are not preregistered.   There will be consent forms to sign, prior to the procedure.   There is a waiting  area for your family, while you are having your biopsy.   Try to have someone with you, to drive you home.   Do not smoke for 2 weeks before the surgery.   Let your caregiver know if you develop a cold or an infection.   Do not drink alcohol for at least 24 hours before surgery.   Wear   a good support bra to the surgery.  AFTER THE PROCEDURE  After surgery, you will be taken to the recovery area, where a nurse will watch and check your progress. Once you are awake, stable, and taking fluids well, if there are no other problems, you will be allowed to go home.   Ice packs applied to your operative site may help with discomfort and keep the swelling down.   You may resume normal diet and activities as directed. Avoid strenuous activities affecting the arm on the side of the biopsy, such as tennis, swimming, heavy lifting (more than 10 pounds) or pulling.   Bruising in the breast is normal following this procedure.   Wearing a support bra, even to bed, may be more comfortable. The bra will also help keep the dressing on.   Change dressings as directed.   Your doctor may apply a pressure dressing on your breast for 24 to 48 hours.   Only take over-the-counter or prescription medicines for pain, discomfort, or fever as directed by your caregiver.   Do not take aspirin, because it can cause bleeding.  HOME CARE INSTRUCTIONS   You may resume your usual diet.   Have someone drive you home after the surgery.   Do not do any exercise, driving, lifting or general activities without your caregiver's permission.   Take medicines and over-the-counter medicines, as ordered by your caregiver.   Keep your postoperative appointments as recommended.   Do not drink alcohol while taking pain medicine.  Finding out the results of your test Not all test results are available during your visit. If your test results are not back during the visit, make an appointment with your caregiver to find out  the results. Do not assume everything is normal if you have not heard from your caregiver or the medical facility. It is important for you to follow up on all of your test results.  SEEK MEDICAL CARE IF:   You notice redness, swelling, or increasing pain in the wound.   You notice a bad smell coming from the wound or dressing.   You develop a rash.   You need stronger pain medicine.   You are having an allergic reaction or problems with your medicines.  SEEK IMMEDIATE MEDICAL CARE IF:   You have difficulty breathing.   You have a fever.   There is increased bleeding (more than a small spot) from the wound.   Pus is coming from the wound.   The wound is breaking open.  Document Released: 05/12/2005 Document Revised: 01/22/2011 Document Reviewed: 03/30/2009 ExitCare Patient Information 2012 ExitCare, LLCFine Needle Aspiration Fine needle aspiration is a procedure used to remove a piece of tissue. The tissue may be removed from a swelling or abnormal growth (tumor). It is also used to confirm a cyst. A cyst is a fluid filled sac. The procedure may be done by your caregiver or a specialist. LET YOUR CAREGIVER KNOW ABOUT:   Any medications you take, especially blood thinners like aspirin.   Any problems you may have had with similar procedures in the past.  RISKS AND COMPLICATIONS This is a safe procedure. There is a very small risk of infection and or bleeding. Other complications can occur if the aspiration site is deep in the body. Your caregiver or specialist will explain this to you.  BEFORE THE PROCEDURE   No special preparation is needed in most cases. Your caregiver will let you know if there are special requirements, such  as an empty stomach.   Be sure to ask your caregiver any questions before the procedure starts.  PROCEDURE   This procedure is done under local or no anesthetic.   The skin is cleaned carefully.   A thin needle is directed into a lump. The needle is  directed in several different directions into the lump while suction is applied to the needle. When samples are removed, the needle is withdrawn.   The contents obtained are placed on a slide. They are then fixed, stained and examined under the microscope. The slide is examined by a specialist in the examination of tissue (pathologist).   A diagnosis can then be made. The pathologist will decide if the specimen is cancerous (malignant) or not cancerous (benign). If fluid is taken from a cyst, cells from the fluid can be examined. If no material is obtained from a fine needle aspiration, the sample may not rule out a problem. Sometimes the procedure is done again. The pathologist may need several days before a result is available.  AFTER THE PROCEDURE   There are usually no limits on diet or activity after a fine needle aspiration.   Your caregiver may give you instructions regarding the aspiration site. This will include information about keeping the site clean and dry. Follow these instructions carefully.   Call for your test results as instructed by your caregiver. Remember, it is your responsibility to get the results of your testing. Do not think everything is fine if you have not heard from your caregiver.   Keep a close watch on the aspiration site. Report any redness, swelling or drainage.   You should not have much pain at the aspiration site.   Take any medications as told by your caregiver.  SEEK MEDICAL CARE IF:   You have pain or drainage at the aspiration site that does not go away.   Swelling at the aspiration site does not gradually go away.  SEEK IMMEDIATE MEDICAL CARE IF:   You develop a fever a day or two after the aspiration.   You develop severe pain at the aspiration site.   You develop a warm, tender swelling at the aspiration site.  Document Released: 05/09/2000 Document Revised: 01/22/2011 Document Reviewed: 01/21/2008 [facility] Patient Information 2012  [place], [place].Marland KitchenMarland Kitchen

## 2011-07-16 NOTE — Progress Notes (Signed)
Lidocaine 2%   3ml injected    Needle core biopsy performed by Dr Deboraha Sprang

## 2011-07-17 ENCOUNTER — Other Ambulatory Visit: Payer: Self-pay | Admitting: Physician Assistant

## 2011-07-17 ENCOUNTER — Other Ambulatory Visit (HOSPITAL_COMMUNITY): Payer: Self-pay | Admitting: Family Medicine

## 2012-06-28 ENCOUNTER — Other Ambulatory Visit (HOSPITAL_COMMUNITY): Payer: Self-pay | Admitting: Family Medicine

## 2012-06-28 DIAGNOSIS — Z139 Encounter for screening, unspecified: Secondary | ICD-10-CM

## 2012-07-12 ENCOUNTER — Ambulatory Visit (HOSPITAL_COMMUNITY)
Admission: RE | Admit: 2012-07-12 | Discharge: 2012-07-12 | Disposition: A | Discharge status: Home / Self Care | Payer: Medicare Other | Source: Ambulatory Visit | Attending: Family Medicine | Admitting: Family Medicine

## 2012-07-12 DIAGNOSIS — Z139 Encounter for screening, unspecified: Secondary | ICD-10-CM

## 2012-07-12 DIAGNOSIS — Z1231 Encounter for screening mammogram for malignant neoplasm of breast: Secondary | ICD-10-CM | POA: Insufficient documentation

## 2013-03-08 ENCOUNTER — Ambulatory Visit (HOSPITAL_COMMUNITY)
Admission: RE | Admit: 2013-03-08 | Discharge: 2013-03-08 | Disposition: A | Discharge status: Home / Self Care | Payer: Medicare Other | Source: Ambulatory Visit | Attending: General Surgery | Admitting: General Surgery

## 2013-03-08 ENCOUNTER — Other Ambulatory Visit (HOSPITAL_COMMUNITY): Payer: Self-pay | Admitting: General Surgery

## 2013-03-08 DIAGNOSIS — D3000 Benign neoplasm of unspecified kidney: Secondary | ICD-10-CM | POA: Diagnosis not present

## 2013-03-08 DIAGNOSIS — K469 Unspecified abdominal hernia without obstruction or gangrene: Secondary | ICD-10-CM

## 2013-03-08 DIAGNOSIS — K4090 Unilateral inguinal hernia, without obstruction or gangrene, not specified as recurrent: Secondary | ICD-10-CM | POA: Insufficient documentation

## 2013-06-08 ENCOUNTER — Encounter (HOSPITAL_COMMUNITY): Payer: Self-pay | Admitting: Pharmacy Technician

## 2013-06-08 NOTE — H&P (Signed)
Gloria Owens is an 78 y.o. female.   Chief Complaint: *Left inguinal hernia** HPI: *Patient is a 78yo wf who presents with a symptomatic left inguinal hernia.  Has been present for some time, but is increasing in size and causing her discomfort.**  No past medical history on file.  No past surgical history on file.  No family history on file. Social History:  has no tobacco, alcohol, and drug history on file.  Allergies: No Known Allergies  No prescriptions prior to admission    No results found for this or any previous visit (from the past 48 hour(s)). No results found.  Review of Systems  Constitutional: Negative.   HENT: Negative.   Eyes: Negative.   Respiratory: Negative.   Cardiovascular: Negative.   Gastrointestinal: Positive for abdominal pain.  Genitourinary: Negative.   Musculoskeletal: Negative.   Skin: Negative.     There were no vitals taken for this visit. Physical Exam  Constitutional: She appears well-developed and well-nourished.  HENT:  Head: Normocephalic and atraumatic.  Neck: Normal range of motion. Neck supple.  Cardiovascular: Normal rate, regular rhythm and normal heart sounds.   Respiratory: Effort normal and breath sounds normal.  GI: Soft. Bowel sounds are normal. She exhibits no distension. There is no rebound.  Left inguinal hernia, reducible.     Assessment/Plan *Imp:  Left inguinal hernia Plan:  Scheduled for left inguinal herniorrhaphy with mesh on 06/20/13.  Risks and benefits of procedure including bleeding, infection, mesh use, and the possibility of recurrence of the hernia were fully explained to the patient, who gives informed consent.**  Gloria Owens A 06/08/2013, 8:03 PM

## 2013-06-13 NOTE — Patient Instructions (Signed)
Your procedure is scheduled on: Monday 06/20/2013  Report to [facility] at 8:40  AM.  Call this number if you have problems the morning of surgery: [phone]   Remember:   Do not drink or eat food:After Midnight.  :  Take these medicines the morning of surgery with A SIP OF WATER:  None   Do not wear jewelry, make-up or nail polish.  Do not wear lotions, powders, or perfumes. You may wear deodorant.  Do not shave 48 hours prior to surgery. Men may shave face and neck.  Do not bring valuables to the hospital.  Contacts, dentures or bridgework may not be worn into surgery.  Leave suitcase in the car. After surgery it may be brought to your room.  For patients admitted to the hospital, checkout time is 11:00 AM the day of discharge.   Patients discharged the day of surgery will not be allowed to drive home.    Special Instructions: Shower using CHG 2 nights before surgery and the night before surgery.  If you shower the day of surgery use CHG.  Use special wash - you have one bottle of CHG for all showers.  You should use approximately 1/3 of the bottle for each shower.   Please read over the following fact sheets that you were given: Pain Booklet, MRSA Information, Surgical Site Infection Prevention and Care and Recovery After Surgery  Hernia Repair Care After These instructions give you information on caring for yourself after your procedure. Your doctor may also give you more specific instructions. Call your doctor if you have any problems or questions after your procedure. HOME CARE   You may have changes in your poops (bowel movements).  You may have loose or watery poop (diarrhea).  You may be not able to poop.  Your bowels will slowly get back to normal.  Do not eat any food that makes you sick to your stomach (nauseous). Eat small meals 4 to 6 times a day instead of 3 large ones.  Do not drink pop. It will give you gas.  Do not drink alcohol.  Do not lift anything  heavier than 10 pounds. This is about the weight of a gallon of milk.  Do not do anything that makes you very tired for at least 6 weeks.  Do not get your wound wet for 2 days.  You may take a sponge bath during this time.  After 2 days you may take a shower. Gently pat your surgical cut (incision) dry with a towel. Do not rub it.  For men: You may have been given an athletic supporter (scrotal support) before you left the hospital. It holds your scrotum and testicles closer to your body so there is no strain on your wound. Wear the supporter until your doctor tells you that you do not need it anymore. GET HELP RIGHT AWAY IF:  You have watery poop, or cannot poop for more than 3 days.  You feel sick to your stomach or throw up (vomit) more than 2 or 3 times.  You have temperature by mouth above 102 F (38.9 C).  You see redness or puffiness (swelling) around your wound.  You see yellowish white fluid (pus) coming from your wound.  You see a bulge or bump in your lower belly (abdomen) or near your groin.  You develop a rash, trouble breathing, or any other symptoms from medicines taken. MAKE SURE YOU:  Understand these instructions.  Will watch your condition.  Will get help right away if your are not doing well or get worse. Document Released: 04/24/2008 Document Revised: 08/04/2011 Document Reviewed: 04/24/2008 [facility] Patient Information 2014 [place]. PATIENT INSTRUCTIONS POST-ANESTHESIA  IMMEDIATELY FOLLOWING SURGERY:  Do not drive or operate machinery for the first twenty four hours after surgery.  Do not make any important decisions for twenty four hours after surgery or while taking narcotic pain medications or sedatives.  If you develop intractable nausea and vomiting or a severe headache please notify your doctor immediately.  FOLLOW-UP:  Please make an appointment with your surgeon as instructed. You do not need to follow up with anesthesia unless  specifically instructed to do so.  WOUND CARE INSTRUCTIONS (if applicable):  Keep a dry clean dressing on the anesthesia/puncture wound site if there is drainage.  Once the wound has quit draining you may leave it open to air.  Generally you should leave the bandage intact for twenty four hours unless there is drainage.  If the epidural site drains for more than 36-48 hours please call the anesthesia department.  QUESTIONS?:  Please feel free to call your physician or the hospital operator if you have any questions, and they will be happy to assist you.

## 2013-06-14 ENCOUNTER — Encounter (HOSPITAL_COMMUNITY): Payer: Self-pay

## 2013-06-14 ENCOUNTER — Encounter (HOSPITAL_COMMUNITY)
Admission: RE | Admit: 2013-06-14 | Discharge: 2013-06-14 | Disposition: A | Discharge status: Home / Self Care | Payer: Medicare Other | Source: Ambulatory Visit | Attending: General Surgery | Admitting: General Surgery

## 2013-06-14 ENCOUNTER — Other Ambulatory Visit: Payer: Self-pay

## 2013-06-14 DIAGNOSIS — Z01812 Encounter for preprocedural laboratory examination: Secondary | ICD-10-CM | POA: Insufficient documentation

## 2013-06-14 DIAGNOSIS — Z01810 Encounter for preprocedural cardiovascular examination: Secondary | ICD-10-CM | POA: Insufficient documentation

## 2013-06-14 DIAGNOSIS — Z01818 Encounter for other preprocedural examination: Secondary | ICD-10-CM | POA: Insufficient documentation

## 2013-06-14 HISTORY — DX: Pure hypercholesterolemia, unspecified: E78.00

## 2013-06-14 HISTORY — DX: Unspecified convulsions: R56.9

## 2013-06-14 LAB — CBC WITH DIFFERENTIAL/PLATELET
Basophils Absolute: 0 10*3/uL (ref 0.0–0.1)
Basophils Relative: 0 % (ref 0–1)
EOS PCT: 2 % (ref 0–5)
Eosinophils Absolute: 0.1 10*3/uL (ref 0.0–0.7)
HEMATOCRIT: 40.4 % (ref 36.0–46.0)
Hemoglobin: 13.7 g/dL (ref 12.0–15.0)
LYMPHS ABS: 1.6 10*3/uL (ref 0.7–4.0)
LYMPHS PCT: 28 % (ref 12–46)
MCH: 32.5 pg (ref 26.0–34.0)
MCHC: 33.9 g/dL (ref 30.0–36.0)
MCV: 96 fL (ref 78.0–100.0)
MONO ABS: 0.5 10*3/uL (ref 0.1–1.0)
Monocytes Relative: 9 % (ref 3–12)
Neutro Abs: 3.6 10*3/uL (ref 1.7–7.7)
Neutrophils Relative %: 62 % (ref 43–77)
Platelets: 226 10*3/uL (ref 150–400)
RBC: 4.21 MIL/uL (ref 3.87–5.11)
RDW: 12.6 % (ref 11.5–15.5)
WBC: 5.8 10*3/uL (ref 4.0–10.5)

## 2013-06-14 LAB — BASIC METABOLIC PANEL
BUN: 16 mg/dL (ref 6–23)
CHLORIDE: 103 meq/L (ref 96–112)
CO2: 26 meq/L (ref 19–32)
CREATININE: 0.82 mg/dL (ref 0.50–1.10)
Calcium: 9.5 mg/dL (ref 8.4–10.5)
GFR calc Af Amer: 79 mL/min — ABNORMAL LOW (ref >90)
GFR calc non Af Amer: 68 mL/min — ABNORMAL LOW (ref >90)
Glucose, Bld: 128 mg/dL — ABNORMAL HIGH (ref 70–99)
Potassium: 4.5 mEq/L (ref 3.7–5.3)
SODIUM: 140 meq/L (ref 137–147)

## 2013-06-20 ENCOUNTER — Encounter (HOSPITAL_COMMUNITY)
Admission: RE | Disposition: A | Discharge status: Home / Self Care | Payer: Self-pay | Source: Ambulatory Visit | Attending: General Surgery

## 2013-06-20 ENCOUNTER — Ambulatory Visit (HOSPITAL_COMMUNITY): Payer: Medicare Other | Admitting: Anesthesiology

## 2013-06-20 ENCOUNTER — Ambulatory Visit (HOSPITAL_COMMUNITY)
Admission: RE | Admit: 2013-06-20 | Discharge: 2013-06-20 | Disposition: A | Discharge status: Home / Self Care | Payer: Medicare Other | Source: Ambulatory Visit | Attending: General Surgery | Admitting: General Surgery

## 2013-06-20 ENCOUNTER — Encounter (HOSPITAL_COMMUNITY): Payer: Medicare Other | Admitting: Anesthesiology

## 2013-06-20 DIAGNOSIS — K4090 Unilateral inguinal hernia, without obstruction or gangrene, not specified as recurrent: Secondary | ICD-10-CM | POA: Insufficient documentation

## 2013-06-20 HISTORY — PX: INSERTION OF MESH: SHX5868

## 2013-06-20 HISTORY — PX: INGUINAL HERNIA REPAIR: SHX194

## 2013-06-20 SURGERY — REPAIR, HERNIA, INGUINAL, ADULT
Anesthesia: General | Site: Groin | Laterality: Left

## 2013-06-20 MED ORDER — MIDAZOLAM HCL 2 MG/2ML IJ SOLN
1.0000 mg | INTRAMUSCULAR | Status: DC | PRN
  Administered 2013-06-20 (×2): 2 mg via INTRAVENOUS

## 2013-06-20 MED ORDER — MIDAZOLAM HCL 2 MG/2ML IJ SOLN
INTRAMUSCULAR | Status: AC
  Filled 2013-06-20: qty 2

## 2013-06-20 MED ORDER — CHLORHEXIDINE GLUCONATE 4 % EX LIQD: 1.0000 "application " | Freq: Once | CUTANEOUS | Status: DC

## 2013-06-20 MED ORDER — BUPIVACAINE HCL (PF) 0.5 % IJ SOLN
INTRAMUSCULAR | Status: AC
  Filled 2013-06-20: qty 30

## 2013-06-20 MED ORDER — ONDANSETRON HCL 4 MG/2ML IJ SOLN
4.0000 mg | Freq: Once | INTRAMUSCULAR | Status: AC | PRN
  Administered 2013-06-20: 4 mg via INTRAVENOUS
  Filled 2013-06-20: qty 2

## 2013-06-20 MED ORDER — FENTANYL CITRATE 0.05 MG/ML IJ SOLN
INTRAMUSCULAR | Status: DC | PRN
  Administered 2013-06-20: 50 ug via INTRAVENOUS
  Administered 2013-06-20 (×2): 25 ug via INTRAVENOUS

## 2013-06-20 MED ORDER — FENTANYL CITRATE 0.05 MG/ML IJ SOLN
25.0000 ug | INTRAMUSCULAR | Status: AC
  Administered 2013-06-20: 09:00:00 via INTRAVENOUS
  Administered 2013-06-20: 25 ug via INTRAVENOUS
  Filled 2013-06-20: qty 2

## 2013-06-20 MED ORDER — SODIUM CHLORIDE 0.9 % IR SOLN
Status: DC | PRN
  Administered 2013-06-20: 500 mL

## 2013-06-20 MED ORDER — MIDAZOLAM HCL 5 MG/5ML IJ SOLN
INTRAMUSCULAR | Status: DC | PRN
  Administered 2013-06-20 (×2): 2 mg via INTRAVENOUS

## 2013-06-20 MED ORDER — FENTANYL CITRATE 0.05 MG/ML IJ SOLN
25.0000 ug | INTRAMUSCULAR | Status: DC | PRN
  Administered 2013-06-20 (×3): 50 ug via INTRAVENOUS
  Filled 2013-06-20: qty 2

## 2013-06-20 MED ORDER — KETOROLAC TROMETHAMINE 30 MG/ML IJ SOLN
30.0000 mg | Freq: Once | INTRAMUSCULAR | Status: AC
  Administered 2013-06-20: 30 mg via INTRAVENOUS

## 2013-06-20 MED ORDER — ONDANSETRON HCL 4 MG/2ML IJ SOLN
4.0000 mg | Freq: Once | INTRAMUSCULAR | Status: AC
  Administered 2013-06-20: 4 mg via INTRAVENOUS
  Filled 2013-06-20: qty 2

## 2013-06-20 MED ORDER — FENTANYL CITRATE 0.05 MG/ML IJ SOLN
INTRAMUSCULAR | Status: AC
  Filled 2013-06-20: qty 2

## 2013-06-20 MED ORDER — LIDOCAINE HCL 1 % IJ SOLN
INTRAMUSCULAR | Status: DC | PRN
  Administered 2013-06-20: 50 mg via INTRADERMAL

## 2013-06-20 MED ORDER — LIDOCAINE HCL (PF) 1 % IJ SOLN
INTRAMUSCULAR | Status: AC
  Filled 2013-06-20: qty 5

## 2013-06-20 MED ORDER — PROPOFOL 10 MG/ML IV BOLUS
INTRAVENOUS | Status: DC | PRN
  Administered 2013-06-20: 140 mg via INTRAVENOUS

## 2013-06-20 MED ORDER — LACTATED RINGERS IV SOLN
INTRAVENOUS | Status: DC
  Administered 2013-06-20: 09:00:00 via INTRAVENOUS

## 2013-06-20 MED ORDER — BUPIVACAINE LIPOSOME 1.3 % IJ SUSP
20.0000 mL | Freq: Once | INTRAMUSCULAR | Status: DC
  Filled 2013-06-20: qty 20

## 2013-06-20 MED ORDER — HYDROCODONE-ACETAMINOPHEN 5-325 MG PO TABS: 1.0000 | ORAL_TABLET | ORAL | Status: AC | PRN

## 2013-06-20 MED ORDER — BUPIVACAINE LIPOSOME 1.3 % IJ SUSP
INTRAMUSCULAR | Status: DC | PRN
  Administered 2013-06-20: 10 mL

## 2013-06-20 MED ORDER — PROPOFOL 10 MG/ML IV BOLUS
INTRAVENOUS | Status: AC
  Filled 2013-06-20: qty 20

## 2013-06-20 MED ORDER — KETOROLAC TROMETHAMINE 30 MG/ML IJ SOLN
INTRAMUSCULAR | Status: AC
  Filled 2013-06-20: qty 1

## 2013-06-20 MED ORDER — CEFAZOLIN SODIUM-DEXTROSE 2-3 GM-% IV SOLR
2.0000 g | INTRAVENOUS | Status: AC
  Administered 2013-06-20: 2 g via INTRAVENOUS
  Filled 2013-06-20: qty 50

## 2013-06-20 SURGICAL SUPPLY — 43 items
ADH SKN CLS APL DERMABOND .7 (GAUZE/BANDAGES/DRESSINGS) ×1
BAG HAMPER (MISCELLANEOUS) ×3 IMPLANT
CLOTH BEACON ORANGE TIMEOUT ST (SAFETY) ×3 IMPLANT
COVER LIGHT HANDLE STERIS (MISCELLANEOUS) ×6 IMPLANT
DECANTER SPIKE VIAL GLASS SM (MISCELLANEOUS) ×1 IMPLANT
DERMABOND ADVANCED (GAUZE/BANDAGES/DRESSINGS) ×2
DERMABOND ADVANCED .7 DNX12 (GAUZE/BANDAGES/DRESSINGS) ×1 IMPLANT
DRAIN PENROSE 3/4X12 (DRAIN) ×3 IMPLANT
ELECT REM PT RETURN 9FT ADLT (ELECTROSURGICAL) ×3
ELECTRODE REM PT RTRN 9FT ADLT (ELECTROSURGICAL) ×1 IMPLANT
FORMALIN 10 PREFIL 120ML (MISCELLANEOUS) IMPLANT
GLOVE BIOGEL PI IND STRL 8 (GLOVE) ×1 IMPLANT
GLOVE BIOGEL PI INDICATOR 8 (GLOVE) ×2
GLOVE ECLIPSE 6.5 STRL STRAW (GLOVE) ×2 IMPLANT
GLOVE ECLIPSE 7.0 STRL STRAW (GLOVE) ×2 IMPLANT
GLOVE ECLIPSE 7.5 STRL STRAW (GLOVE) ×3 IMPLANT
GLOVE INDICATOR 7.0 STRL GRN (GLOVE) ×2 IMPLANT
GLOVE INDICATOR 7.5 STRL GRN (GLOVE) ×2 IMPLANT
GOWN STRL REUS W/TWL LRG LVL3 (GOWN DISPOSABLE) ×9 IMPLANT
INST SET MINOR GENERAL (KITS) ×3 IMPLANT
KIT ROOM TURNOVER APOR (KITS) ×3 IMPLANT
MANIFOLD NEPTUNE II (INSTRUMENTS) ×3 IMPLANT
MESH HERNIA 1.6X1.9 PLUG LRG (Mesh General) ×2 IMPLANT
MESH HERNIA PLUG LRG (Mesh General) ×4 IMPLANT
NDL HYPO 25X1 1.5 SAFETY (NEEDLE) IMPLANT
NEEDLE HYPO 25X1 1.5 SAFETY (NEEDLE) ×3 IMPLANT
NS IRRIG 1000ML POUR BTL (IV SOLUTION) ×3 IMPLANT
PACK MINOR (CUSTOM PROCEDURE TRAY) ×3 IMPLANT
PAD ARMBOARD 7.5X6 YLW CONV (MISCELLANEOUS) ×3 IMPLANT
SET BASIN LINEN APH (SET/KITS/TRAYS/PACK) ×3 IMPLANT
SOL PREP PROV IODINE SCRUB 4OZ (MISCELLANEOUS) ×3 IMPLANT
SUT NOVA NAB GS-22 2 2-0 T-19 (SUTURE) ×6 IMPLANT
SUT NOVAFIL NAB HGS22 2-0 30IN (SUTURE) ×2 IMPLANT
SUT SILK 3 0 (SUTURE)
SUT SILK 3-0 18XBRD TIE 12 (SUTURE) IMPLANT
SUT VIC AB 2-0 CT1 27 (SUTURE) ×3
SUT VIC AB 2-0 CT1 TAPERPNT 27 (SUTURE) ×1 IMPLANT
SUT VIC AB 3-0 SH 27 (SUTURE) ×3
SUT VIC AB 3-0 SH 27X BRD (SUTURE) ×1 IMPLANT
SUT VIC AB 4-0 PS2 27 (SUTURE) ×3 IMPLANT
SUT VICRYL AB 3 0 TIES (SUTURE) ×2 IMPLANT
SYR CONTROL 10ML LL (SYRINGE) ×3 IMPLANT
TOWEL OR 17X26 4PK STRL BLUE (TOWEL DISPOSABLE) ×3 IMPLANT

## 2013-06-20 NOTE — Interval H&P Note (Signed)
History and Physical Interval Note:  06/20/2013 9:32 AM  Gloria Owens  has presented today for surgery, with the diagnosis of left inguinal hernia  The various methods of treatment have been discussed with the patient and family. After consideration of risks, benefits and other options for treatment, the patient has consented to  Procedure(s): HERNIA REPAIR INGUINAL ADULT (Left) as a surgical intervention .  The patient's history has been reviewed, patient examined, no change in status, stable for surgery.  I have reviewed the patient's chart and labs.  Questions were answered to the patient's satisfaction.     Aviva Signs A

## 2013-06-20 NOTE — Anesthesia Postprocedure Evaluation (Signed)
  Anesthesia Post-op Note  Patient: Gloria Owens  Procedure(s) Performed: Procedure(s): HERNIA REPAIR INGUINAL ADULT (Left) INSERTION OF MESH (Left)  Patient Location: PACU  Anesthesia Type:General  Level of Consciousness: awake, alert , oriented and patient cooperative  Airway and Oxygen Therapy: Patient Spontanous Breathing  Post-op Pain: 3 /10, mild  Post-op Assessment: Post-op Vital signs reviewed, Patient's Cardiovascular Status Stable, Respiratory Function Stable, Patent Airway, No signs of Nausea or vomiting and Pain level controlled  Post-op Vital Signs: Reviewed and stable  Complications: No apparent anesthesia complications

## 2013-06-20 NOTE — Discharge Instructions (Signed)
Inguinal Hernia, Adult  °Care After °Refer to this sheet in the next few weeks. These discharge instructions provide you with general information on caring for yourself after you leave the hospital. Your caregiver may also give you specific instructions. Your treatment has been planned according to the most current medical practices available, but unavoidable complications sometimes occur. If you have any problems or questions after discharge, please call your caregiver. °HOME CARE INSTRUCTIONS °· Put ice on the operative site. °· Put ice in a plastic bag. °· Place a towel between your skin and the bag. °· Leave the ice on for 15-20 minutes at a time, 03-04 times a day while awake. °· Change bandages (dressings) as directed. °· Keep the wound dry and clean. The wound may be washed gently with soap and water. Gently blot or dab the wound dry. It is okay to take showers 24 to 48 hours after surgery. Do not take baths, use swimming pools, or use hot tubs for 10 days, or as directed by your caregiver. °· Only take over-the-counter or prescription medicines for pain, discomfort, or fever as directed by your caregiver. °· Continue your normal diet as directed. °· Do not lift anything more than 10 pounds or play contact sports for 3 weeks, or as directed. °SEEK MEDICAL CARE IF: °· There is redness, swelling, or increasing pain in the wound. °· There is fluid (pus) coming from the wound. °· There is drainage from a wound lasting longer than 1 day. °· You have an oral temperature above 102° F (38.9° C). °· You notice a bad smell coming from the wound or dressing. °· The wound breaks open after the stitches (sutures) have been removed. °· You notice increasing pain in the shoulders (shoulder strap areas). °· You develop dizzy episodes or fainting while standing. °· You feel sick to your stomach (nauseous) or throw up (vomit). °SEEK IMMEDIATE MEDICAL CARE IF: °· You develop a rash. °· You have difficulty breathing. °· You  develop a reaction or have side effects to medicines you were given. °MAKE SURE YOU:  °· Understand these instructions. °· Will watch your condition. °· Will get help right away if you are not doing well or get worse. °Document Released: 06/12/2006 Document Revised: 08/04/2011 Document Reviewed: 04/11/2009 °ExitCare® Patient Information ©2014 [employer]. ° °

## 2013-06-20 NOTE — Anesthesia Preprocedure Evaluation (Signed)
Anesthesia Evaluation  Patient identified by MRN, date of birth, ID band Patient awake    Reviewed: Allergy & Precautions, H&P , NPO status , Patient's Chart, lab work & pertinent test results  Airway Mallampati: II TM Distance: >3 FB     Dental  (+) Partial Lower and Partial Upper   Pulmonary sleep apnea ,  breath sounds clear to auscultation        Cardiovascular + Valvular Problems/Murmurs MVP Rhythm:Regular Rate:Normal     Neuro/Psych Seizures -, Well Controlled,     GI/Hepatic negative GI ROS,   Endo/Other    Renal/GU      Musculoskeletal   Abdominal   Peds  Hematology   Anesthesia Other Findings   Reproductive/Obstetrics                           Anesthesia Physical Anesthesia Plan  ASA: II  Anesthesia Plan: General   Post-op Pain Management:    Induction: Intravenous  Airway Management Planned: LMA  Additional Equipment:   Intra-op Plan:   Post-operative Plan: Extubation in OR  Informed Consent: I have reviewed the patients History and Physical, chart, labs and discussed the procedure including the risks, benefits and alternatives for the proposed anesthesia with the patient or authorized representative who has indicated his/her understanding and acceptance.     Plan Discussed with:   Anesthesia Plan Comments:         Anesthesia Quick Evaluation

## 2013-06-20 NOTE — Anesthesia Procedure Notes (Signed)
Procedure Name: LMA Insertion Date/Time: 06/20/2013 10:36 AM Performed by: Charmaine Downs Pre-anesthesia Checklist: Patient being monitored, Suction available, Emergency Drugs available and Patient identified Patient Re-evaluated:Patient Re-evaluated prior to inductionOxygen Delivery Method: Circle system utilized Preoxygenation: Pre-oxygenation with 100% oxygen Intubation Type: IV induction Ventilation: Mask ventilation without difficulty LMA: LMA inserted LMA Size: 3.0 Tube type: Oral Number of attempts: 1 Placement Confirmation: ETT inserted through vocal cords under direct vision,  positive ETCO2 and breath sounds checked- equal and bilateral Tube secured with: Tape Dental Injury: Teeth and Oropharynx as per pre-operative assessment

## 2013-06-20 NOTE — Op Note (Signed)
Patient:  Gloria Owens  DOB:  1936-04-04  MRN:  [id number]   Preop Diagnosis:  Left inguinal hernia  Postop Diagnosis:  Same  Procedure:  Left inguinal herniorrhaphy with mesh  Surgeon:  Aviva Signs, M.D.  Anes:  General  Indications:  Patient is a 78 year old white female who presents with a symptomatic left inguinal hernia. The risks and benefits of the procedure including bleeding, infection, mesh use, and the possibility of recurrence of the hernia were fully explained to the patient, who gave informed consent.  Procedure note:  The patient was placed in the supine position. After induction of general endotracheal anesthesia, the abdomen was prepped and draped using usual sterile technique with Betadine. Surgical site confirmation was performed.  A transverse incision was made the left groin region down to the external oblique aponeuroses. The aponeuroses was incised to the external ring. A Penrose drain was placed around the processes vaginalis. This was ligated both distally and proximally using 3-0 Vicryl ties. The patient had both an indirect and direct hernia component. A large size Bard PerFix plug was then inserted into both areas and secured to the transversalis fascia using 2-0 Novafil interrupted sutures. An on lay patch was then placed along the floor of inguinal canal and secured superiorly to the conjoined tendon and inferiorly to the shelving edge of Poupart's ligament using 2-0 Novafil interrupted sutures. The external oblique aponeuroses was reapproximated using a 2-0 Vicryl running suture. The subcutaneous layer was reapproximated using 3-0 Vicryl interrupted suture. The skin was closed using 4-0 Vicryl subcuticular suture.  Exparel was instilled in the surrounding wound. Dermabond was then applied.  All tape and needle counts were correct at the end of the procedure. Patient was awakened and transferred to PACU in stable condition.  Complications:  None  EBL:   Minimal  Specimen:  None

## 2013-06-20 NOTE — Transfer of Care (Signed)
Immediate Anesthesia Transfer of Care Note  Patient: Gloria Owens  Procedure(s) Performed: Procedure(s): HERNIA REPAIR INGUINAL ADULT (Left) INSERTION OF MESH (Left)  Patient Location: PACU  Anesthesia Type:General  Level of Consciousness: awake and patient cooperative  Airway & Oxygen Therapy: Patient Spontanous Breathing and Patient connected to face mask oxygen  Post-op Assessment: Report given to PACU RN, Post -op Vital signs reviewed and stable and Patient moving all extremities  Post vital signs: Reviewed and stable  Complications: No apparent anesthesia complications

## 2013-06-21 ENCOUNTER — Encounter (HOSPITAL_COMMUNITY): Payer: Self-pay | Admitting: General Surgery

## 2013-09-06 ENCOUNTER — Other Ambulatory Visit (HOSPITAL_COMMUNITY): Payer: Self-pay | Admitting: Family Medicine

## 2013-09-06 DIAGNOSIS — Z1231 Encounter for screening mammogram for malignant neoplasm of breast: Secondary | ICD-10-CM

## 2013-09-12 ENCOUNTER — Ambulatory Visit (HOSPITAL_COMMUNITY)
Admission: RE | Admit: 2013-09-12 | Discharge: 2013-09-12 | Disposition: A | Discharge status: Home / Self Care | Payer: Medicare Other | Source: Ambulatory Visit | Attending: Family Medicine | Admitting: Family Medicine

## 2013-09-12 DIAGNOSIS — Z1231 Encounter for screening mammogram for malignant neoplasm of breast: Secondary | ICD-10-CM | POA: Insufficient documentation

## 2014-09-26 ENCOUNTER — Other Ambulatory Visit (HOSPITAL_COMMUNITY): Payer: Self-pay | Admitting: Family Medicine

## 2014-09-26 DIAGNOSIS — Z1231 Encounter for screening mammogram for malignant neoplasm of breast: Secondary | ICD-10-CM

## 2014-10-16 ENCOUNTER — Ambulatory Visit (HOSPITAL_COMMUNITY)
Admission: RE | Admit: 2014-10-16 | Discharge: 2014-10-16 | Disposition: A | Discharge status: Home / Self Care | Payer: Medicare Other | Source: Ambulatory Visit | Attending: Family Medicine | Admitting: Family Medicine

## 2014-10-16 DIAGNOSIS — Z1231 Encounter for screening mammogram for malignant neoplasm of breast: Secondary | ICD-10-CM | POA: Insufficient documentation

## 2016-03-18 ENCOUNTER — Other Ambulatory Visit (HOSPITAL_COMMUNITY): Payer: Self-pay | Admitting: Family Medicine

## 2016-03-18 DIAGNOSIS — Z1231 Encounter for screening mammogram for malignant neoplasm of breast: Secondary | ICD-10-CM

## 2016-03-26 ENCOUNTER — Ambulatory Visit (HOSPITAL_COMMUNITY): Payer: Medicare Other

## 2016-04-07 ENCOUNTER — Ambulatory Visit (HOSPITAL_COMMUNITY)
Admission: RE | Admit: 2016-04-07 | Discharge: 2016-04-07 | Disposition: A | Discharge status: Home / Self Care | Payer: Medicare Other | Source: Ambulatory Visit | Attending: Family Medicine | Admitting: Family Medicine

## 2016-04-07 DIAGNOSIS — R928 Other abnormal and inconclusive findings on diagnostic imaging of breast: Secondary | ICD-10-CM | POA: Insufficient documentation

## 2016-04-07 DIAGNOSIS — Z1231 Encounter for screening mammogram for malignant neoplasm of breast: Secondary | ICD-10-CM | POA: Diagnosis present

## 2016-04-10 ENCOUNTER — Other Ambulatory Visit: Payer: Self-pay | Admitting: Family Medicine

## 2016-04-10 DIAGNOSIS — R928 Other abnormal and inconclusive findings on diagnostic imaging of breast: Secondary | ICD-10-CM

## 2016-04-29 ENCOUNTER — Ambulatory Visit (HOSPITAL_COMMUNITY)
Admission: RE | Admit: 2016-04-29 | Discharge: 2016-04-29 | Disposition: A | Discharge status: Home / Self Care | Payer: Medicare Other | Source: Ambulatory Visit | Attending: Family Medicine | Admitting: Family Medicine

## 2016-04-29 DIAGNOSIS — R928 Other abnormal and inconclusive findings on diagnostic imaging of breast: Secondary | ICD-10-CM

## 2016-04-29 DIAGNOSIS — N6002 Solitary cyst of left breast: Secondary | ICD-10-CM | POA: Diagnosis not present

## 2016-06-16 ENCOUNTER — Other Ambulatory Visit (HOSPITAL_COMMUNITY): Payer: Self-pay | Admitting: Family Medicine

## 2016-06-16 DIAGNOSIS — D499 Neoplasm of unspecified behavior of unspecified site: Secondary | ICD-10-CM

## 2016-06-16 DIAGNOSIS — R1030 Lower abdominal pain, unspecified: Secondary | ICD-10-CM

## 2016-06-16 DIAGNOSIS — IMO0002 Reserved for concepts with insufficient information to code with codable children: Secondary | ICD-10-CM

## 2016-06-16 DIAGNOSIS — R229 Localized swelling, mass and lump, unspecified: Principal | ICD-10-CM

## 2016-06-22 ENCOUNTER — Encounter (HOSPITAL_COMMUNITY): Payer: Self-pay

## 2016-06-23 ENCOUNTER — Ambulatory Visit (HOSPITAL_COMMUNITY)
Admission: RE | Admit: 2016-06-23 | Discharge: 2016-06-23 | Disposition: A | Discharge status: Home / Self Care | Payer: Medicare Other | Source: Ambulatory Visit | Attending: Family Medicine | Admitting: Family Medicine

## 2016-06-23 DIAGNOSIS — R188 Other ascites: Secondary | ICD-10-CM | POA: Diagnosis not present

## 2016-06-23 DIAGNOSIS — Z90710 Acquired absence of both cervix and uterus: Secondary | ICD-10-CM | POA: Insufficient documentation

## 2016-06-23 DIAGNOSIS — R1030 Lower abdominal pain, unspecified: Secondary | ICD-10-CM | POA: Insufficient documentation

## 2016-06-23 DIAGNOSIS — K5730 Diverticulosis of large intestine without perforation or abscess without bleeding: Secondary | ICD-10-CM | POA: Diagnosis not present

## 2016-06-23 MED ORDER — IOPAMIDOL (ISOVUE-300) INJECTION 61%
100.0000 mL | Freq: Once | INTRAVENOUS | Status: AC | PRN
  Administered 2016-06-23: 100 mL via INTRAVENOUS

## 2017-06-05 ENCOUNTER — Other Ambulatory Visit (HOSPITAL_COMMUNITY): Payer: Self-pay | Admitting: Family Medicine

## 2017-06-05 DIAGNOSIS — Z1231 Encounter for screening mammogram for malignant neoplasm of breast: Secondary | ICD-10-CM

## 2017-06-22 ENCOUNTER — Ambulatory Visit (HOSPITAL_COMMUNITY)
Admission: RE | Admit: 2017-06-22 | Discharge: 2017-06-22 | Disposition: A | Discharge status: Home / Self Care | Payer: Medicare Other | Source: Ambulatory Visit | Attending: Family Medicine | Admitting: Family Medicine

## 2017-06-22 ENCOUNTER — Encounter (HOSPITAL_COMMUNITY): Payer: Self-pay

## 2017-06-22 DIAGNOSIS — Z1231 Encounter for screening mammogram for malignant neoplasm of breast: Secondary | ICD-10-CM | POA: Diagnosis present

## 2019-07-29 ENCOUNTER — Ambulatory Visit: Payer: Medicare Other | Attending: Internal Medicine

## 2019-07-29 DIAGNOSIS — Z23 Encounter for immunization: Secondary | ICD-10-CM | POA: Insufficient documentation

## 2019-07-29 NOTE — Progress Notes (Signed)
   Covid-19 Vaccination Clinic  Name:  Gloria Owens    MRN: [id number] DOB: 29-Apr-1936  07/29/2019  [name] was observed post Covid-19 immunization for 15 minutes without incident. She was provided with Vaccine Information Sheet and instruction to access the V-Safe system.   [name] was instructed to call 911 with any severe reactions post vaccine: Marland Kitchen Difficulty breathing  . Swelling of face and throat  . A fast heartbeat  . A bad rash all over body  . Dizziness and weakness   Immunizations Administered    Name Date Dose VIS Date Route   Moderna COVID-19 Vaccine 07/29/2019 10:06 AM 0.5 mL 04/26/2019 Intramuscular   Manufacturer: Moderna   Lot: [id number]   [place][id number]

## 2019-08-30 ENCOUNTER — Ambulatory Visit: Payer: Medicare Other | Attending: Internal Medicine

## 2019-08-30 DIAGNOSIS — Z23 Encounter for immunization: Secondary | ICD-10-CM

## 2019-08-30 NOTE — Progress Notes (Signed)
   Covid-19 Vaccination Clinic  Name:  Gloria Owens    MRN: [id number] DOB: Dec 17, 1935  08/30/2019  [name] was observed post Covid-19 immunization for 15 minutes without incident. She was provided with Vaccine Information Sheet and instruction to access the V-Safe system.   [name] was instructed to call 911 with any severe reactions post vaccine: Marland Kitchen Difficulty breathing  . Swelling of face and throat  . A fast heartbeat  . A bad rash all over body  . Dizziness and weakness   Immunizations Administered    Name Date Dose VIS Date Route   Moderna COVID-19 Vaccine 08/30/2019 11:46 AM 0.5 mL 04/26/2019 Intramuscular   Manufacturer: Moderna   Lot: [id number]   [place][id number]

## 2020-07-11 ENCOUNTER — Ambulatory Visit: Payer: Medicare Other | Admitting: Nurse Practitioner

## 2020-07-12 ENCOUNTER — Other Ambulatory Visit: Payer: Self-pay | Admitting: Nurse Practitioner

## 2020-07-12 ENCOUNTER — Other Ambulatory Visit: Payer: Self-pay

## 2020-07-12 ENCOUNTER — Encounter: Payer: Self-pay | Admitting: Nurse Practitioner

## 2020-07-12 ENCOUNTER — Ambulatory Visit (INDEPENDENT_AMBULATORY_CARE_PROVIDER_SITE_OTHER): Payer: Medicare Other | Admitting: Nurse Practitioner

## 2020-07-12 DIAGNOSIS — Z7689 Persons encountering health services in other specified circumstances: Secondary | ICD-10-CM | POA: Diagnosis not present

## 2020-07-12 DIAGNOSIS — E785 Hyperlipidemia, unspecified: Secondary | ICD-10-CM | POA: Diagnosis not present

## 2020-07-12 DIAGNOSIS — E119 Type 2 diabetes mellitus without complications: Secondary | ICD-10-CM | POA: Insufficient documentation

## 2020-07-12 DIAGNOSIS — I10 Essential (primary) hypertension: Secondary | ICD-10-CM | POA: Diagnosis not present

## 2020-07-12 DIAGNOSIS — E1165 Type 2 diabetes mellitus with hyperglycemia: Secondary | ICD-10-CM | POA: Insufficient documentation

## 2020-07-12 DIAGNOSIS — E559 Vitamin D deficiency, unspecified: Secondary | ICD-10-CM

## 2020-07-12 DIAGNOSIS — E118 Type 2 diabetes mellitus with unspecified complications: Secondary | ICD-10-CM | POA: Insufficient documentation

## 2020-07-12 DIAGNOSIS — Z139 Encounter for screening, unspecified: Secondary | ICD-10-CM | POA: Diagnosis not present

## 2020-07-12 NOTE — Assessment & Plan Note (Signed)
-  BP slightly elevated today 145/73 -she state she takes her BP meds at night, and she hasn't taken them today -she doesn't know what meds she takes; get med rec form [employer]

## 2020-07-12 NOTE — Assessment & Plan Note (Signed)
-  takes vit d 5000 IU daily -unsure of osteoporosis status

## 2020-07-12 NOTE — Assessment & Plan Note (Signed)
-  request record from [facility] -get med reconciliation from [facility]

## 2020-07-12 NOTE — Assessment & Plan Note (Signed)
-  she rarely checks her CBG -she states her diet has been off since her husband was dying (died July 14, 2020 from ALS, she was his sole caregiver for his last 5 months) -take metformin, unsure of dose

## 2020-07-12 NOTE — Progress Notes (Signed)
New Patient Office Visit  Subjective:  Patient ID: Gloria Owens, female    DOB: [birth year]  Age: 85 y.o. MRN: [id number]  CC:  Chief Complaint  Patient presents with  . New Patient (Initial Visit)    HPI Gloria Owens presents for new patient visit. Transferring care from Dr. Cindie Laroche. Last physical was about 3 months ago. Last labs were done about 3 months ago, and she was supposed to have labs drawn today.  She recently lost her husband 06/10/20 to ALS.  Past Medical History:  Diagnosis Date  . Hypercholesteremia   . Seizures ([place])    with eye surgery several years ago-unknown etiology-no meds    Past Surgical History:  Procedure Laterality Date  . ABDOMINAL HYSTERECTOMY     total hysterectomy  . BACK SURGERY     fusion of back x2 and 1 other back surgery  . CHOLECYSTECTOMY    . COLON RESECTION     from endometriosis  . CYSTOSCOPY     fulgeration of tumors  . HERNIA REPAIR Right   . INGUINAL HERNIA REPAIR Left 06/20/2013   Procedure: HERNIA REPAIR INGUINAL ADULT;  Surgeon: Jamesetta So, MD;  Location: [facility];  Service: General;  Laterality: Left;  . INSERTION OF MESH Left 06/20/2013   Procedure: INSERTION OF MESH;  Surgeon: Jamesetta So, MD;  Location: [facility];  Service: General;  Laterality: Left;    History reviewed. No pertinent family history.  Social History   Socioeconomic History  . Marital status: Widowed    Spouse name: Not on file  . Number of children: Not on file  . Years of education: Not on file  . Highest education level: Not on file  Occupational History  . Occupation: worked at [employer] in [employer] prior ot retirement  Tobacco Use  . Smoking status: Never Smoker  . Smokeless tobacco: Never Used  Vaping Use  . Vaping Use: Never used  Substance and Sexual Activity  . Alcohol use: No  . Drug use: No  . Sexual activity: Not Currently    Birth control/protection: Surgical  Other Topics Concern  . Not on file  Social History  Narrative   No children- had endometriosis an dhysterectomy; husband recently deceased   Nephew is Allen Norris- security guard at [employer]   Social Determinants of Health   Financial Resource Strain: Not on [employer] Insecurity: Not on file  Transportation Needs: Not on file  Physical Activity: Not on file  Stress: Not on file  Social Connections: Not on file  Intimate Partner Violence: Not on file    ROS Review of Systems  Objective:   Today's Vitals: BP (!) 145/73   Pulse 82   Temp 98.4 F (36.9 C)   Resp 18   Ht '5\' 5"'  (1.651 m)   Wt 145 lb (65.8 kg)   SpO2 98%   BMI 24.13 kg/m   Physical Exam  Assessment & Plan:   Problem List Items Addressed This Visit      Cardiovascular and Mediastinum   Hypertension, essential    -BP slightly elevated today 145/73 -she state she takes her BP meds at night, and she hasn't taken them today -she doesn't know what meds she takes; get med rec form [place]      Relevant Orders   CBC with Differential/Platelet   CMP14+EGFR     Endocrine   Type II diabetes mellitus, uncontrolled ([place])    -she rarely checks her  CBG -she states her diet has been off since her husband was dying (died July 04, 2020 from ALS, she was his sole caregiver for his last 5 months) -take metformin, unsure of dose      Relevant Orders   CBC with Differential/Platelet   CMP14+EGFR   Hemoglobin A1c   Microalbumin / creatinine urine ratio     Other   Encounter to establish care    -request record from [facility] -get med reconciliation from [place]   CBC with Differential/Platelet   CMP14+EGFR   Hemoglobin A1c   Lipid Panel With LDL/HDL Ratio   Microalbumin / creatinine urine ratio   Vitamin D (25 hydroxy)   Screening due    -she states she had DEXA scan previously, but unsure of date -will check records form [place], but she may be due for this      Hyperlipidemia    -drawing labs today       Relevant Orders   Lipid Panel With LDL/HDL Ratio   Vitamin D deficiency    -takes vit d 5000 IU daily -unsure of osteoporosis status      Relevant Orders   Vitamin D (25 hydroxy)      Outpatient Encounter Medications as of 07/12/2020  Medication Sig  . aspirin EC 81 MG tablet Take 81 mg by mouth daily.  . simvastatin (ZOCOR) 40 MG tablet Take 40 mg by mouth daily.   No facility-administered encounter medications on file as of 07/12/2020.  Fasting labs drawn today  Follow-up: Return in about 3 months (around 10/09/2020) for Lab follow-up.   Noreene Larsson, NP

## 2020-07-12 NOTE — Assessment & Plan Note (Signed)
-  drawing labs today 

## 2020-07-12 NOTE — Assessment & Plan Note (Signed)
-  she states she had DEXA scan previously, but unsure of date -will check records form [place], but she may be due for this

## 2020-07-14 LAB — LIPID PANEL WITH LDL/HDL RATIO
Cholesterol, Total: 129 mg/dL (ref 100–199)
HDL: 48 mg/dL (ref >39)
LDL Chol Calc (NIH): 53 mg/dL (ref 0–99)
LDL/HDL Ratio: 1.1 ratio (ref 0.0–3.2)
Triglycerides: 168 mg/dL — ABNORMAL HIGH (ref 0–149)
VLDL Cholesterol Cal: 28 mg/dL (ref 5–40)

## 2020-07-14 LAB — CMP14+EGFR
ALT: 11 IU/L (ref 0–32)
AST: 15 IU/L (ref 0–40)
Albumin/Globulin Ratio: 2.2 (ref 1.2–2.2)
Albumin: 4.1 g/dL (ref 3.6–4.6)
Alkaline Phosphatase: 65 IU/L (ref 44–121)
BUN/Creatinine Ratio: 20 (ref 12–28)
BUN: 14 mg/dL (ref 8–27)
Bilirubin Total: 0.4 mg/dL (ref 0.0–1.2)
CO2: 18 mmol/L — ABNORMAL LOW (ref 20–29)
Calcium: 9.2 mg/dL (ref 8.7–10.3)
Chloride: 100 mmol/L (ref 96–106)
Creatinine, Ser: 0.71 mg/dL (ref 0.57–1.00)
GFR calc Af Amer: 92 mL/min/{1.73_m2} (ref >59)
GFR calc non Af Amer: 80 mL/min/{1.73_m2} (ref >59)
Globulin, Total: 1.9 g/dL (ref 1.5–4.5)
Glucose: 153 mg/dL — ABNORMAL HIGH (ref 65–99)
Potassium: 4.3 mmol/L (ref 3.5–5.2)
Sodium: 138 mmol/L (ref 134–144)
Total Protein: 6 g/dL (ref 6.0–8.5)

## 2020-07-14 LAB — CBC WITH DIFFERENTIAL/PLATELET
Basophils Absolute: 0 10*3/uL (ref 0.0–0.2)
Basos: 0 %
EOS (ABSOLUTE): 0.1 10*3/uL (ref 0.0–0.4)
Eos: 1 %
Hematocrit: 33.5 % — ABNORMAL LOW (ref 34.0–46.6)
Hemoglobin: 11.1 g/dL (ref 11.1–15.9)
Immature Grans (Abs): 0 10*3/uL (ref 0.0–0.1)
Immature Granulocytes: 1 %
Lymphocytes Absolute: 1.1 10*3/uL (ref 0.7–3.1)
Lymphs: 20 %
MCH: 30.1 pg (ref 26.6–33.0)
MCHC: 33.1 g/dL (ref 31.5–35.7)
MCV: 91 fL (ref 79–97)
Monocytes Absolute: 0.5 10*3/uL (ref 0.1–0.9)
Monocytes: 9 %
Neutrophils Absolute: 3.7 10*3/uL (ref 1.4–7.0)
Neutrophils: 69 %
Platelets: 197 10*3/uL (ref 150–450)
RBC: 3.69 x10E6/uL — ABNORMAL LOW (ref 3.77–5.28)
RDW: 12.5 % (ref 11.7–15.4)
WBC: 5.4 10*3/uL (ref 3.4–10.8)

## 2020-07-14 LAB — VITAMIN D 25 HYDROXY (VIT D DEFICIENCY, FRACTURES): Vit D, 25-Hydroxy: 68.1 ng/mL (ref 30.0–100.0)

## 2020-07-14 LAB — HEMOGLOBIN A1C
Est. average glucose Bld gHb Est-mCnc: 137 mg/dL
Hgb A1c MFr Bld: 6.4 % — ABNORMAL HIGH (ref 4.8–5.6)

## 2020-07-14 LAB — MICROALBUMIN / CREATININE URINE RATIO
Creatinine, Urine: 55.7 mg/dL
Microalb/Creat Ratio: <5 mg/g creat (ref 0–29)
Microalbumin, Urine: <3 ug/mL

## 2020-07-16 ENCOUNTER — Other Ambulatory Visit: Payer: Self-pay

## 2020-07-16 DIAGNOSIS — F5101 Primary insomnia: Secondary | ICD-10-CM

## 2020-07-16 DIAGNOSIS — R52 Pain, unspecified: Secondary | ICD-10-CM

## 2020-07-16 DIAGNOSIS — E1165 Type 2 diabetes mellitus with hyperglycemia: Secondary | ICD-10-CM

## 2020-07-16 MED ORDER — METFORMIN HCL 500 MG PO TABS: 500.0000 mg | ORAL_TABLET | Freq: Two times a day (BID) | ORAL | 3 refills | Status: DC

## 2020-07-16 MED ORDER — TRAZODONE HCL 50 MG PO TABS: 25.0000 mg | ORAL_TABLET | Freq: Every evening | ORAL | 3 refills | Status: DC | PRN

## 2020-07-16 MED ORDER — TRAMADOL HCL 50 MG PO TABS
50.0000 mg | ORAL_TABLET | Freq: Two times a day (BID) | ORAL | 0 refills | Status: AC
Start: 2020-07-16 — End: 2020-07-21

## 2020-07-18 NOTE — Progress Notes (Signed)
Her blood sugar was elevated, but her A1c was good at 6.4, which is in the prediabetic range. No need to add or change medications.  RBC were just a little low, but we will monitor that with routine labs as long as she has no signs of bleeding like bloody stools or dark tarry stools?

## 2020-07-26 ENCOUNTER — Encounter (HOSPITAL_COMMUNITY): Payer: Self-pay | Admitting: Emergency Medicine

## 2020-07-26 ENCOUNTER — Other Ambulatory Visit: Payer: Self-pay

## 2020-07-26 ENCOUNTER — Emergency Department (HOSPITAL_COMMUNITY)
Admission: EM | Admit: 2020-07-26 | Discharge: 2020-07-26 | Disposition: A | Discharge status: Home / Self Care | Payer: Medicare Other | Attending: Emergency Medicine | Admitting: Emergency Medicine

## 2020-07-26 DIAGNOSIS — I10 Essential (primary) hypertension: Secondary | ICD-10-CM | POA: Insufficient documentation

## 2020-07-26 DIAGNOSIS — E119 Type 2 diabetes mellitus without complications: Secondary | ICD-10-CM | POA: Insufficient documentation

## 2020-07-26 DIAGNOSIS — Z7982 Long term (current) use of aspirin: Secondary | ICD-10-CM | POA: Insufficient documentation

## 2020-07-26 DIAGNOSIS — S81811A Laceration without foreign body, right lower leg, initial encounter: Secondary | ICD-10-CM | POA: Insufficient documentation

## 2020-07-26 DIAGNOSIS — Z7984 Long term (current) use of oral hypoglycemic drugs: Secondary | ICD-10-CM | POA: Diagnosis not present

## 2020-07-26 DIAGNOSIS — W268XXA Contact with other sharp object(s), not elsewhere classified, initial encounter: Secondary | ICD-10-CM | POA: Insufficient documentation

## 2020-07-26 DIAGNOSIS — S8011XA Contusion of right lower leg, initial encounter: Secondary | ICD-10-CM

## 2020-07-26 DIAGNOSIS — S8991XA Unspecified injury of right lower leg, initial encounter: Secondary | ICD-10-CM | POA: Diagnosis present

## 2020-07-26 NOTE — Discharge Instructions (Addendum)
Keep wound clean and dry. Use Dove soap on a q-tip as needed. Apply bacitracin twice daily.  Elevate leg to help with swelling. Close wound follow up with your provider.

## 2020-07-26 NOTE — ED Provider Notes (Signed)
[facility] EMERGENCY DEPARTMENT Provider Note   CSN: [id number] Arrival date & time: 07/26/20  1322     History Chief Complaint  Patient presents with  . Leg Injury    Gloria Owens is a 85 y.o. female.  85 year old female presents with right lower leg wound. Patient states she was loading gas tanks in a truck when she scrapped her leg on the truck resulting in laceration to the lower leg. Bleeding is controlled, not anticoagulated, does have a history of DM, last td was less than 5 years ago. No other injuries.         Past Medical History:  Diagnosis Date  . Hypercholesteremia   . Seizures ([place])    with eye surgery several years ago-unknown etiology-no meds    Patient Active Problem List   Diagnosis Date Noted  . Encounter to establish care 07/12/2020  . Screening due 07/12/2020  . Hypertension, essential 07/12/2020  . Type II diabetes mellitus, uncontrolled ([place]) 07/12/2020  . Hyperlipidemia 07/12/2020  . Vitamin D deficiency 07/12/2020    Past Surgical History:  Procedure Laterality Date  . ABDOMINAL HYSTERECTOMY     total hysterectomy  . BACK SURGERY     fusion of back x2 and 1 other back surgery  . CHOLECYSTECTOMY    . COLON RESECTION     from endometriosis  . CYSTOSCOPY     fulgeration of tumors  . HERNIA REPAIR Right   . INGUINAL HERNIA REPAIR Left 06/20/2013   Procedure: HERNIA REPAIR INGUINAL ADULT;  Surgeon: Jamesetta So, MD;  Location: [facility];  Service: General;  Laterality: Left;  . INSERTION OF MESH Left 06/20/2013   Procedure: INSERTION OF MESH;  Surgeon: Jamesetta So, MD;  Location: [facility];  Service: General;  Laterality: Left;     OB History   No obstetric history on file.     History reviewed. No pertinent family history.  Social History   Tobacco Use  . Smoking status: Never Smoker  . Smokeless tobacco: Never Used  Vaping Use  . Vaping Use: Never used  Substance Use Topics  . Alcohol use: No  . Drug use: No    Home  Medications Prior to Admission medications   Medication Sig Start Date End Date Taking? Authorizing Provider  aspirin EC 81 MG tablet Take 81 mg by mouth daily.    [provider]  metFORMIN (GLUCOPHAGE) 500 MG tablet Take 1 tablet (500 mg total) by mouth 2 (two) times daily with a meal. 07/16/20   Noreene Larsson, NP  simvastatin (ZOCOR) 40 MG tablet Take 40 mg by mouth daily.    [provider]  traZODone (DESYREL) 50 MG tablet Take 0.5-1 tablets (25-50 mg total) by mouth at bedtime as needed for sleep. 07/16/20   Noreene Larsson, NP    Allergies    Patient has no known allergies.  Review of Systems   Review of Systems  Constitutional: Negative for fever.  Musculoskeletal: Negative for arthralgias and myalgias.  Skin: Positive for wound.  Allergic/Immunologic: Positive for immunocompromised state.  Neurological: Negative for weakness and numbness.  Hematological: Does not bruise/bleed easily.  Psychiatric/Behavioral: Negative for confusion.    Physical Exam Updated Vital Signs BP 117/67   Pulse 94   Temp 98.7 F (37.1 C) (Oral)   Resp 16   Ht 5\' 5"  (1.651 m)   Wt 64.4 kg   SpO2 100%   BMI 23.63 kg/m   Physical Exam Vitals  and nursing note reviewed.  Constitutional:      General: She is not in acute distress.    Appearance: She is well-developed and well-nourished. She is not diaphoretic.  HENT:     Head: Normocephalic and atraumatic.  Pulmonary:     Effort: Pulmonary effort is normal.  Musculoskeletal:        General: Tenderness present.     Comments: Hematoma x 2 to right lower anterior leg, 3.5cm flap laceration to anterior lower leg, bleeding controlled.   Skin:    General: Skin is warm and dry.     Findings: Bruising present.  Neurological:     Mental Status: She is alert and oriented to person, place, and time.     Sensory: No sensory deficit.     Motor: No weakness.  Psychiatric:        Mood and Affect: Mood and affect normal.         Behavior: Behavior normal.     ED Results / Procedures / Treatments   Labs (all labs ordered are listed, but only abnormal results are displayed) Labs Reviewed - No data to display  EKG None  Radiology No results found.  Procedures .Marland KitchenLaceration Repair  Date/Time: 07/26/2020 6:51 PM Performed by: Tacy Learn, PA-C Authorized by: Tacy Learn, PA-C   Consent:    Consent obtained:  Verbal   Consent given by:  Patient   Risks discussed:  Infection, need for additional repair, pain, poor cosmetic result and poor wound healing   Alternatives discussed:  No treatment and delayed treatment Universal protocol:    Procedure explained and questions answered to patient or proxy's satisfaction: yes     Relevant documents present and verified: yes     Test results available: yes     Imaging studies available: yes     Required blood products, implants, devices, and special equipment available: yes     Site/side marked: yes     Immediately prior to procedure, a time out was called: yes     Patient identity confirmed:  Verbally with patient Anesthesia:    Anesthesia method:  None Laceration details:    Location:  Leg   Leg location:  R lower leg   Length (cm):  3.5   Depth (mm):  3 Pre-procedure details:    Preparation:  Patient was prepped and draped in usual sterile fashion Exploration:    Wound extent: no foreign bodies/material noted, no muscle damage noted, no nerve damage noted and no tendon damage noted     Contaminated: no   Treatment:    Area cleansed with:  Saline   Amount of cleaning:  Standard   Irrigation solution:  Sterile saline   Debridement:  None Skin repair:    Repair method:  Steri-Strips   Number of Steri-Strips:  1 Approximation:    Approximation:  Close Repair type:    Repair type:  Simple Post-procedure details:    Dressing:  Bulky dressing   Procedure completion:  Tolerated     Medications Ordered in ED Medications - No data to  display  ED Course  I have reviewed the triage vital signs and the nursing notes.  Pertinent labs & imaging results that were available during my care of the patient were reviewed by me and considered in my medical decision making (see chart for details).  Clinical Course as of 07/26/20 1852  Thu Jul 26, 4017  [id number] 85 year old female presents with complaint of laceration to her right  lower leg after scraping her leg on the back of a truck today.  Tetanus is up-to-date, bleeding is controlled.  Wound was cleaned and closed with 1 derma clip.  Patient tolerated procedure well.  Recommend wound follow-up with PCP. [LM]    Clinical Course User Index [LM] Roque Lias   MDM Rules/Calculators/A&P                          Final Clinical Impression(s) / ED Diagnoses Final diagnoses:  Laceration of right lower leg, initial encounter  Hematoma of right lower leg    Rx / DC Orders ED Discharge Orders    None       Roque Lias 07/26/20 Serita Grammes, MD 07/26/20 2227

## 2020-07-26 NOTE — ED Triage Notes (Signed)
Pt states she was getting gas cans out of the back of a truck and her leg gave out and she scrapped her right leg on the back of the truck.

## 2020-07-31 ENCOUNTER — Ambulatory Visit (INDEPENDENT_AMBULATORY_CARE_PROVIDER_SITE_OTHER): Payer: Medicare Other | Admitting: Nurse Practitioner

## 2020-07-31 ENCOUNTER — Encounter: Payer: Self-pay | Admitting: Nurse Practitioner

## 2020-07-31 ENCOUNTER — Other Ambulatory Visit: Payer: Self-pay

## 2020-07-31 DIAGNOSIS — S81801D Unspecified open wound, right lower leg, subsequent encounter: Secondary | ICD-10-CM

## 2020-07-31 NOTE — Assessment & Plan Note (Signed)
-  fell out of truck on 07/26/20 -TDaP today, no tetanus shot documented in last 10 years, and she is unsure if she had one -no sign of infection -we discussed using neosporin around wound; dressed with tegaderm today

## 2020-07-31 NOTE — Progress Notes (Signed)
Acute Office Visit  Subjective:    Patient ID: Gloria Owens, female    DOB: [birth year], 85 y.o.   MRN: [id number]  Chief Complaint  Patient presents with  . Laceration    R lower leg x 5 days ago.     HPI Patient is in today for ED follow-up for leg laceration.  From ED note: 85 year old female presents with right lower leg wound. Patient states she was loading gas tanks in a truck when she scrapped her leg on the truck resulting in laceration to the lower leg.   Past Medical History:  Diagnosis Date  . Hypercholesteremia   . Seizures ([place])    with eye surgery several years ago-unknown etiology-no meds    Past Surgical History:  Procedure Laterality Date  . ABDOMINAL HYSTERECTOMY     total hysterectomy  . BACK SURGERY     fusion of back x2 and 1 other back surgery  . CHOLECYSTECTOMY    . COLON RESECTION     from endometriosis  . CYSTOSCOPY     fulgeration of tumors  . HERNIA REPAIR Right   . INGUINAL HERNIA REPAIR Left 06/20/2013   Procedure: HERNIA REPAIR INGUINAL ADULT;  Surgeon: Jamesetta So, MD;  Location: [facility];  Service: General;  Laterality: Left;  . INSERTION OF MESH Left 06/20/2013   Procedure: INSERTION OF MESH;  Surgeon: Jamesetta So, MD;  Location: [facility];  Service: General;  Laterality: Left;    History reviewed. No pertinent family history.  Social History   Socioeconomic History  . Marital status: Widowed    Spouse name: Not on file  . Number of children: Not on file  . Years of education: Not on file  . Highest education level: Not on file  Occupational History  . Occupation: worked at [employer] in [employer] prior ot retirement  Tobacco Use  . Smoking status: Never Smoker  . Smokeless tobacco: Never Used  Vaping Use  . Vaping Use: Never used  Substance and Sexual Activity  . Alcohol use: No  . Drug use: No  . Sexual activity: Not Currently    Birth control/protection: Surgical  Other Topics Concern  . Not on file  Social History  Narrative   No children- had endometriosis an dhysterectomy; husband recently deceased   Nephew is Allen Norris- security guard at [employer]   Social Determinants of Health   Financial Resource Strain: Not on [employer] Insecurity: Not on file  Transportation Needs: Not on file  Physical Activity: Not on file  Stress: Not on file  Social Connections: Not on file  Intimate Partner Violence: Not on file    Outpatient Medications Prior to Visit  Medication Sig Dispense Refill  . aspirin EC 81 MG tablet Take 81 mg by mouth daily.    . metFORMIN (GLUCOPHAGE) 500 MG tablet Take 1 tablet (500 mg total) by mouth 2 (two) times daily with a meal. 180 tablet 3  . olmesartan (BENICAR) 20 MG tablet Take 20 mg by mouth daily.    . simvastatin (ZOCOR) 40 MG tablet Take 40 mg by mouth daily.    . traZODone (DESYREL) 50 MG tablet Take 0.5-1 tablets (25-50 mg total) by mouth at bedtime as needed for sleep. 30 tablet 3   No facility-administered medications prior to visit.    Allergies  Allergen Reactions  . Penicillins     Review of Systems  Constitutional: Negative.   Respiratory: Negative.   Cardiovascular:  Negative.   Skin:       Right lower leg has skin tear and 2 hematomas; she states they are smaller than when she was seen in ED       Objective:    Physical Exam Constitutional:      Appearance: Normal appearance.  Skin:    Comments: Healing skin tear to right lower leg; 2 hematomas, 1 at site of open wound and 1 below; right ankle has bruising  Neurological:     Mental Status: She is alert.     BP 116/62   Pulse 85   Temp 98.4 F (36.9 C)   Resp 20   Ht 5\' 5"  (1.651 m)   Wt 143 lb (64.9 kg)   SpO2 91%   BMI 23.80 kg/m  Wt Readings from Last 3 Encounters:  07/31/20 143 lb (64.9 kg)  07/26/20 142 lb (64.4 kg)  07/12/20 145 lb (65.8 kg)    Health Maintenance Due  Topic Date Due  . FOOT EXAM  Never done  . OPHTHALMOLOGY EXAM  Never done  . TETANUS/TDAP  Never done   . DEXA SCAN  Never done  . COVID-19 Vaccine (3 - Booster for Moderna series) 02/29/2020    There are no preventive care reminders to display for this patient.   No results found for: TSH Lab Results  Component Value Date   WBC 5.4 07/12/2020   HGB 11.1 07/12/2020   HCT 33.5 (L) 07/12/2020   MCV 91 07/12/2020   PLT 197 07/12/2020   Lab Results  Component Value Date   NA 138 07/12/2020   K 4.3 07/12/2020   CO2 18 (L) 07/12/2020   GLUCOSE 153 (H) 07/12/2020   BUN 14 07/12/2020   CREATININE 0.71 07/12/2020   BILITOT 0.4 07/12/2020   ALKPHOS 65 07/12/2020   AST 15 07/12/2020   ALT 11 07/12/2020   PROT 6.0 07/12/2020   ALBUMIN 4.1 07/12/2020   CALCIUM 9.2 07/12/2020   Lab Results  Component Value Date   CHOL 129 07/12/2020   Lab Results  Component Value Date   HDL 48 07/12/2020   Lab Results  Component Value Date   LDLCALC 53 07/12/2020   Lab Results  Component Value Date   TRIG 168 (H) 07/12/2020   No results found for: CHOLHDL Lab Results  Component Value Date   HGBA1C 6.4 (H) 07/12/2020       Assessment & Plan:   Problem List Items Addressed This Visit      Other   Unspecified open wound, right lower leg, subsequent encounter    -fell out of truck on 07/26/20 -TDaP today, no tetanus shot documented in last 10 years, and she is unsure if she had one -no sign of infection -we discussed using neosporin around wound; dressed with tegaderm today          No orders of the defined types were placed in this encounter.    Noreene Larsson, NP

## 2020-10-11 ENCOUNTER — Ambulatory Visit: Payer: Medicare Other | Admitting: Nurse Practitioner

## 2020-10-16 ENCOUNTER — Encounter: Payer: Self-pay | Admitting: Nurse Practitioner

## 2020-10-16 ENCOUNTER — Other Ambulatory Visit: Payer: Self-pay

## 2020-10-16 ENCOUNTER — Ambulatory Visit (INDEPENDENT_AMBULATORY_CARE_PROVIDER_SITE_OTHER): Payer: Medicare Other | Admitting: Nurse Practitioner

## 2020-10-16 DIAGNOSIS — M5441 Lumbago with sciatica, right side: Secondary | ICD-10-CM

## 2020-10-16 DIAGNOSIS — G8929 Other chronic pain: Secondary | ICD-10-CM

## 2020-10-16 DIAGNOSIS — I10 Essential (primary) hypertension: Secondary | ICD-10-CM | POA: Diagnosis not present

## 2020-10-16 DIAGNOSIS — E1165 Type 2 diabetes mellitus with hyperglycemia: Secondary | ICD-10-CM | POA: Diagnosis not present

## 2020-10-16 DIAGNOSIS — E785 Hyperlipidemia, unspecified: Secondary | ICD-10-CM | POA: Diagnosis not present

## 2020-10-16 DIAGNOSIS — E559 Vitamin D deficiency, unspecified: Secondary | ICD-10-CM | POA: Diagnosis not present

## 2020-10-16 DIAGNOSIS — M549 Dorsalgia, unspecified: Secondary | ICD-10-CM | POA: Insufficient documentation

## 2020-10-16 NOTE — Assessment & Plan Note (Addendum)
BP Readings from Last 3 Encounters:  10/16/20 133/70  07/31/20 116/62  07/26/20 117/67   -well controlled -continue olmesartan

## 2020-10-16 NOTE — Patient Instructions (Signed)
Please have fasting labs drawn at some point this week.  Prior to your next appointment, please have fasting labs drawn 2-3 days prior to your appointment so we can discuss the results during your office visit.

## 2020-10-16 NOTE — Progress Notes (Signed)
Established Patient Office Visit  Subjective:  Patient ID: Gloria Owens, female    DOB: 1936/02/03  Age: 85 y.o. MRN: [id number]  CC:  Chief Complaint  Patient presents with  . Diabetes    HPI TERRINA DOCTER presents for lab follow-up. She hasn't had fasting labs prior to this appt.  She has DM, but blood sugars have been well controlled.  AWV due in November.  She has been having back pain that radiates down her legs.  She saw Dr. Ellene Route for previous back surgery, and will follow-up with him.  Past Medical History:  Diagnosis Date  . Hypercholesteremia   . Seizures ([place])    with eye surgery several years ago-unknown etiology-no meds    Past Surgical History:  Procedure Laterality Date  . ABDOMINAL HYSTERECTOMY     total hysterectomy  . BACK SURGERY     fusion of back x2 and 1 other back surgery  . CHOLECYSTECTOMY    . COLON RESECTION     from endometriosis  . CYSTOSCOPY     fulgeration of tumors  . HERNIA REPAIR Right   . INGUINAL HERNIA REPAIR Left 06/20/2013   Procedure: HERNIA REPAIR INGUINAL ADULT;  Surgeon: Jamesetta So, MD;  Location: [facility];  Service: General;  Laterality: Left;  . INSERTION OF MESH Left 06/20/2013   Procedure: INSERTION OF MESH;  Surgeon: Jamesetta So, MD;  Location: [facility];  Service: General;  Laterality: Left;    History reviewed. No pertinent family history.  Social History   Socioeconomic History  . Marital status: Widowed    Spouse name: Not on file  . Number of children: Not on file  . Years of education: Not on file  . Highest education level: Not on file  Occupational History  . Occupation: worked at [employer] in [employer] prior ot retirement  Tobacco Use  . Smoking status: Never Smoker  . Smokeless tobacco: Never Used  Vaping Use  . Vaping Use: Never used  Substance and Sexual Activity  . Alcohol use: No  . Drug use: No  . Sexual activity: Not Currently    Birth control/protection: Surgical  Other Topics Concern   . Not on file  Social History Narrative   No children- had endometriosis an dhysterectomy; husband recently deceased   Nephew is Allen Norris- security guard at [employer]   Social Determinants of Health   Financial Resource Strain: Not on [employer] Insecurity: Not on file  Transportation Needs: Not on file  Physical Activity: Not on file  Stress: Not on file  Social Connections: Not on file  Intimate Partner Violence: Not on file    Outpatient Medications Prior to Visit  Medication Sig Dispense Refill  . aspirin EC 81 MG tablet Take 81 mg by mouth daily.    . metFORMIN (GLUCOPHAGE) 500 MG tablet Take 1 tablet (500 mg total) by mouth 2 (two) times daily with a meal. 180 tablet 3  . olmesartan (BENICAR) 20 MG tablet Take 20 mg by mouth daily.    . simvastatin (ZOCOR) 40 MG tablet Take 40 mg by mouth daily.    . traZODone (DESYREL) 50 MG tablet Take 0.5-1 tablets (25-50 mg total) by mouth at bedtime as needed for sleep. 30 tablet 3   No facility-administered medications prior to visit.    Allergies  Allergen Reactions  . Penicillins     ROS Review of Systems  Constitutional: Negative.   Respiratory: Negative.   Cardiovascular: Negative.  Musculoskeletal: Positive for back pain.       Numbness and tingling going down her leg; had previous back surgery, and she will see Dr. Ellene Route  Psychiatric/Behavioral: Negative.       Objective:    Physical Exam Constitutional:      Appearance: Normal appearance.  Cardiovascular:     Rate and Rhythm: Normal rate and regular rhythm.     Pulses: Normal pulses.     Heart sounds: Normal heart sounds.  Pulmonary:     Effort: Pulmonary effort is normal.     Breath sounds: Normal breath sounds.  Neurological:     Mental Status: She is alert.  Psychiatric:        Mood and Affect: Mood normal.        Behavior: Behavior normal.        Thought Content: Thought content normal.        Judgment: Judgment normal.     BP 133/70   Pulse  82   Temp 98.5 F (36.9 C)   Resp 20   Ht 5' 4.5" (1.638 m)   Wt 146 lb (66.2 kg)   SpO2 98%   BMI 24.67 kg/m  Wt Readings from Last 3 Encounters:  10/16/20 146 lb (66.2 kg)  07/31/20 143 lb (64.9 kg)  07/26/20 142 lb (64.4 kg)     Health Maintenance Due  Topic Date Due  . FOOT EXAM  Never done  . OPHTHALMOLOGY EXAM  Never done  . TETANUS/TDAP  Never done  . DEXA SCAN  Never done  . PNA vac Low Risk Adult (1 of 2 - PCV13) Never done  . COVID-19 Vaccine (3 - Booster for Moderna series) 01/30/2020    There are no preventive care reminders to display for this patient.  No results found for: TSH Lab Results  Component Value Date   WBC 5.4 07/12/2020   HGB 11.1 07/12/2020   HCT 33.5 (L) 07/12/2020   MCV 91 07/12/2020   PLT 197 07/12/2020   Lab Results  Component Value Date   NA 138 07/12/2020   K 4.3 07/12/2020   CO2 18 (L) 07/12/2020   GLUCOSE 153 (H) 07/12/2020   BUN 14 07/12/2020   CREATININE 0.71 07/12/2020   BILITOT 0.4 07/12/2020   ALKPHOS 65 07/12/2020   AST 15 07/12/2020   ALT 11 07/12/2020   PROT 6.0 07/12/2020   ALBUMIN 4.1 07/12/2020   CALCIUM 9.2 07/12/2020   Lab Results  Component Value Date   CHOL 129 07/12/2020   Lab Results  Component Value Date   HDL 48 07/12/2020   Lab Results  Component Value Date   LDLCALC 53 07/12/2020   Lab Results  Component Value Date   TRIG 168 (H) 07/12/2020   No results found for: CHOLHDL Lab Results  Component Value Date   HGBA1C 6.4 (H) 07/12/2020      Assessment & Plan:   Problem List Items Addressed This Visit      Cardiovascular and Mediastinum   Hypertension, essential    BP Readings from Last 3 Encounters:  10/16/20 133/70  07/31/20 116/62  07/26/20 117/67   -well controlled -continue olmesartan      Relevant Orders   CBC with Differential/Platelet   CMP14+EGFR   Lipid Panel With LDL/HDL Ratio     Endocrine   Type II diabetes mellitus, uncontrolled ([place])    Lab Results   Component Value Date   HGBA1C 6.4 (H) 07/12/2020  -at goal at last lab  drawn; will order labs today -continue metformin for glycemic control -on simvastatin and olmesartan       Relevant Orders   Hemoglobin A1c   Lipid Panel With LDL/HDL Ratio     Other   Hyperlipidemia    Lab Results  Component Value Date   CHOL 129 07/12/2020   HDL 48 07/12/2020   LDLCALC 53 07/12/2020   TRIG 168 (H) 07/12/2020   -continue simvastatin for now -drawing labs      Relevant Orders   Lipid Panel With LDL/HDL Ratio   Vitamin D deficiency    -checking labs      Back pain    -follow-up with Dr. Ellene Route         No orders of the defined types were placed in this encounter.   Follow-up: Return in about 6 months (around 04/18/2021) for In-person AWV/Diabetic Foot Exam.    Noreene Larsson, NP

## 2020-10-16 NOTE — Assessment & Plan Note (Addendum)
Lab Results  Component Value Date   CHOL 129 07/12/2020   HDL 48 07/12/2020   LDLCALC 53 07/12/2020   TRIG 168 (H) 07/12/2020   -continue simvastatin for now -drawing labs

## 2020-10-16 NOTE — Assessment & Plan Note (Signed)
-  follow-up with Dr. Ellene Route

## 2020-10-16 NOTE — Assessment & Plan Note (Signed)
-  checking labs 

## 2020-10-16 NOTE — Assessment & Plan Note (Addendum)
Lab Results  Component Value Date   HGBA1C 6.4 (H) 07/12/2020  -at goal at last lab drawn; will order labs today -continue metformin for glycemic control -on simvastatin and olmesartan

## 2020-10-17 DIAGNOSIS — E1165 Type 2 diabetes mellitus with hyperglycemia: Secondary | ICD-10-CM | POA: Diagnosis not present

## 2020-10-17 DIAGNOSIS — E785 Hyperlipidemia, unspecified: Secondary | ICD-10-CM | POA: Diagnosis not present

## 2020-10-17 DIAGNOSIS — I10 Essential (primary) hypertension: Secondary | ICD-10-CM | POA: Diagnosis not present

## 2020-10-17 DIAGNOSIS — Z7689 Persons encountering health services in other specified circumstances: Secondary | ICD-10-CM | POA: Diagnosis not present

## 2020-10-18 LAB — CBC WITH DIFFERENTIAL/PLATELET
Basophils Absolute: 0 10*3/uL (ref 0.0–0.2)
Eos: 1 %
Immature Grans (Abs): 0 10*3/uL (ref 0.0–0.1)
Monocytes Absolute: 1.1 10*3/uL — ABNORMAL HIGH (ref 0.1–0.9)
RDW: 13.1 % (ref 11.7–15.4)

## 2020-10-18 LAB — VITAMIN D 25 HYDROXY (VIT D DEFICIENCY, FRACTURES): Vit D, 25-Hydroxy: 58.2 ng/mL (ref 30.0–100.0)

## 2020-10-18 LAB — CMP14+EGFR
Albumin: 4.4 g/dL (ref 3.6–4.6)
BUN: 17 mg/dL (ref 8–27)
Chloride: 99 mmol/L (ref 96–106)
eGFR: 56 mL/min/{1.73_m2} — ABNORMAL LOW (ref >59)

## 2020-10-18 NOTE — Progress Notes (Signed)
Kidney function is just a little low, so make sure you are drinking plenty of water.  A1c looks good at 6.3, so no need to add or change medications for blood sugar.

## 2020-10-19 LAB — LIPID PANEL WITH LDL/HDL RATIO
Cholesterol, Total: 164 mg/dL (ref 100–199)
HDL: 67 mg/dL (ref >39)
LDL Chol Calc (NIH): 74 mg/dL (ref 0–99)
LDL/HDL Ratio: 1.1 ratio (ref 0.0–3.2)
Triglycerides: 134 mg/dL (ref 0–149)
VLDL Cholesterol Cal: 23 mg/dL (ref 5–40)

## 2020-10-19 LAB — HEMOGLOBIN A1C
Est. average glucose Bld gHb Est-mCnc: 134 mg/dL
Hgb A1c MFr Bld: 6.3 % — ABNORMAL HIGH (ref 4.8–5.6)

## 2020-10-19 LAB — MICROALBUMIN / CREATININE URINE RATIO
Creatinine, Urine: 90.8 mg/dL
Microalb/Creat Ratio: 30 mg/g creat — ABNORMAL HIGH (ref 0–29)
Microalbumin, Urine: 27.3 ug/mL

## 2020-10-19 LAB — CMP14+EGFR
ALT: 11 IU/L (ref 0–32)
AST: 15 IU/L (ref 0–40)
Albumin/Globulin Ratio: 2.1 (ref 1.2–2.2)
Alkaline Phosphatase: 60 IU/L (ref 44–121)
BUN/Creatinine Ratio: 17 (ref 12–28)
Bilirubin Total: 0.4 mg/dL (ref 0.0–1.2)
CO2: 23 mmol/L (ref 20–29)
Calcium: 9.6 mg/dL (ref 8.7–10.3)
Creatinine, Ser: 1.01 mg/dL — ABNORMAL HIGH (ref 0.57–1.00)
Globulin, Total: 2.1 g/dL (ref 1.5–4.5)
Glucose: 111 mg/dL — ABNORMAL HIGH (ref 65–99)
Potassium: 4.3 mmol/L (ref 3.5–5.2)
Sodium: 138 mmol/L (ref 134–144)
Total Protein: 6.5 g/dL (ref 6.0–8.5)

## 2020-10-19 LAB — CBC WITH DIFFERENTIAL/PLATELET
Basos: 0 %
EOS (ABSOLUTE): 0.1 10*3/uL (ref 0.0–0.4)
Hematocrit: 35.3 % (ref 34.0–46.6)
Hemoglobin: 11.5 g/dL (ref 11.1–15.9)
Immature Granulocytes: 0 %
Lymphocytes Absolute: 0.9 10*3/uL (ref 0.7–3.1)
Lymphs: 9 %
MCH: 29.7 pg (ref 26.6–33.0)
MCHC: 32.6 g/dL (ref 31.5–35.7)
MCV: 91 fL (ref 79–97)
Monocytes: 12 %
Neutrophils Absolute: 7.2 10*3/uL — ABNORMAL HIGH (ref 1.4–7.0)
Neutrophils: 78 %
Platelets: 144 10*3/uL — ABNORMAL LOW (ref 150–450)
RBC: 3.87 x10E6/uL (ref 3.77–5.28)
WBC: 9.3 10*3/uL (ref 3.4–10.8)

## 2020-11-14 ENCOUNTER — Ambulatory Visit (INDEPENDENT_AMBULATORY_CARE_PROVIDER_SITE_OTHER): Payer: Medicare Other

## 2020-11-14 ENCOUNTER — Other Ambulatory Visit: Payer: Self-pay

## 2020-11-14 DIAGNOSIS — Z0000 Encounter for general adult medical examination without abnormal findings: Secondary | ICD-10-CM

## 2020-11-14 NOTE — Progress Notes (Signed)
Subjective:   Gloria Owens is a 85 y.o. female who presents for an Initial Medicare Annual Wellness Visit.  I connected with Tanara Turvey  today by telephone and verified that I am speaking with the correct person using two identifiers. Location patient: home Location provider: work Persons participating in the virtual visit: patient, provider.   I discussed the limitations, risks, security and privacy concerns of performing an evaluation and management service by telephone and the availability of in person appointments. I also discussed with the patient that there may be a patient responsible charge related to this service. The patient expressed understanding and verbally consented to this telephonic visit.    Interactive audio and video telecommunications were attempted between this provider and patient, however failed, due to patient having technical difficulties OR patient did not have access to video capability.  We continued and completed visit with audio only.     Review of Systems    N/A  Cardiac Risk Factors include: advanced age (>15men, >74 women);hypertension;diabetes mellitus;dyslipidemia     Objective:    Today's Vitals   There is no height or weight on file to calculate BMI.  Advanced Directives 11/14/2020 07/26/2020 06/14/2013  Does Patient Have a Medical Advance Directive? No No Patient has advance directive, copy not in chart  Type of Advance Directive - - Living will  Does patient want to make changes to medical advance directive? - - No  Copy of [place] in Chart? - - Copy requested from family  Would patient like information on creating a medical advance directive? No - Patient declined No - Patient declined -  Pre-existing out of facility DNR order (yellow form or pink MOST form) - - No    Current Medications (verified) Outpatient Encounter Medications as of 11/14/2020  Medication Sig   aspirin EC 81 MG tablet Take 81 mg by mouth daily.    metFORMIN (GLUCOPHAGE) 500 MG tablet Take 1 tablet (500 mg total) by mouth 2 (two) times daily with a meal.   olmesartan (BENICAR) 20 MG tablet Take 20 mg by mouth daily.   simvastatin (ZOCOR) 40 MG tablet Take 40 mg by mouth daily.   traZODone (DESYREL) 50 MG tablet Take 0.5-1 tablets (25-50 mg total) by mouth at bedtime as needed for sleep. (Patient not taking: Reported on 11/14/2020)   No facility-administered encounter medications on file as of 11/14/2020.    Allergies (verified) Penicillins   History: Past Medical History:  Diagnosis Date   Hypercholesteremia    Seizures ([place])    with eye surgery several years ago-unknown etiology-no meds   Past Surgical History:  Procedure Laterality Date   ABDOMINAL HYSTERECTOMY     total hysterectomy   BACK SURGERY     fusion of back x2 and 1 other back surgery   CHOLECYSTECTOMY     COLON RESECTION     from endometriosis   CYSTOSCOPY     fulgeration of tumors   HERNIA REPAIR Right    INGUINAL HERNIA REPAIR Left 06/20/2013   Procedure: HERNIA REPAIR INGUINAL ADULT;  Surgeon: Jamesetta So, MD;  Location: [facility];  Service: General;  Laterality: Left;   INSERTION OF MESH Left 06/20/2013   Procedure: INSERTION OF MESH;  Surgeon: Jamesetta So, MD;  Location: [facility];  Service: General;  Laterality: Left;   History reviewed. No pertinent family history. 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  Tobacco Use   Smoking status: Never   Smokeless tobacco: Never  Vaping Use   Vaping Use: Never used  Substance and Sexual Activity   Alcohol use: No   Drug use: No   Sexual activity: Not Currently    Birth control/protection: Surgical  Other Topics Concern   Not on file  Social History  Narrative   No children- had endometriosis an dhysterectomy; husband recently deceased   Nephew is [employer]- security guard at [employer] Strain: Low Risk    Difficulty of Paying Living Expenses: Not hard at all  Food Insecurity: No Food Insecurity   Worried About Charity fundraiser in the Last Year: Never true   Arboriculturist in the Last Year: Never true  Transportation Needs: No Transportation Needs   Lack of Transportation (Medical): No   Lack of Transportation (Non-Medical): No  Physical Activity: Sufficiently Active   Days of Exercise per Week: 7 days   Minutes of Exercise per Session: 30 min  Stress: No Stress Concern Present   Feeling of Stress : Not at all  Social Connections: Moderately Isolated   Frequency of Communication with Friends and Family: More than three times a week   Frequency of Social Gatherings with Friends and Family: More than three times a week   Attends Religious Services: More than 4 times per year   Active Member of [employer] or Organizations: No   Attends Archivist Meetings: Never   Marital Status: Widowed    Tobacco Counseling Counseling given: Not Answered   Clinical Intake:  Pre-visit preparation completed: Yes  Pain : No/denies pain     Nutritional Risks: Nausea/ vomitting/ diarrhea (vomiting x 1 week ago) Diabetes: Yes CBG done?: No Did pt. bring in CBG monitor from home?: No  How often do you need to have someone help you when you read instructions, pamphlets, or other written materials from your doctor or pharmacy?: 1 - Never  Diabetic?Yes Nutrition Risk Assessment:  Has the patient had any N/V/D within the last 2 months?  Yes  Does the patient have any non-healing wounds?  No  Has the patient had any unintentional weight loss or weight gain?  No   Diabetes:  Is the patient diabetic?  Yes  If diabetic, was a CBG obtained today?  No  Did the patient bring in  their glucometer from home?  No  How often do you monitor your CBG's? Patient states she does not check glucose often. Checks randomly    Financial Strains and Diabetes Management:  Are you having any financial strains with the device, your supplies or your medication? No .  Does the patient want to be seen by Chronic Care Management for management of their diabetes?  No  Would the patient like to be referred to a Nutritionist or for Diabetic Management?  No   Diabetic Exams:  Diabetic Eye Exam: Overdue for diabetic eye exam. Pt has been advised about the importance in completing this exam. Patient advised to call and schedule an eye exam. Diabetic Foot Exam: Overdue, Pt has been advised about the importance in completing this exam. Pt is scheduled for diabetic foot exam on 04/22/2021.   Interpreter Needed?:  No  Information entered by :: [place] of Daily Living In your present state of health, do you have any difficulty performing the following activities: 11/14/2020  Hearing? N  Vision? N  Difficulty concentrating or making decisions? N  Walking or climbing stairs? N  Dressing or bathing? N  Doing errands, shopping? N  Preparing Food and eating ? N  Using the Toilet? N  In the past six months, have you accidently leaked urine? Y  Comment currently wears a incontience pad  Do you have problems with loss of bowel control? N  Managing your Medications? N  Managing your Finances? N  Housekeeping or managing your Housekeeping? N  Some recent data might be hidden    Patient Care Team: Noreene Larsson, NP as PCP - General (Nurse Practitioner)  Indicate any recent Medical Services you may have received from other than [facility] providers in the past year (date may be approximate).     Assessment:   This is a routine wellness examination for Fabiola.  Hearing/Vision screen Vision Screening - Comments:: Patient states has not had eye exam in a few years. Currently wears  glasses   Dietary issues and exercise activities discussed: Current Exercise Habits: Home exercise routine, Type of exercise: walking, Time (Minutes): 35, Frequency (Times/Week): 7, Weekly Exercise (Minutes/Week): 245, Intensity: Mild, Exercise limited by: None identified   Goals Addressed             This Visit's Progress    DIET - INCREASE WATER INTAKE       Exercise 3x per week (30 min per time)       Patient states she wants to start walk       HEMOGLOBIN A1C < 7         Depression Screen PHQ 2/9 Scores 11/14/2020 10/16/2020 10/16/2020 07/31/2020 07/12/2020  PHQ - 2 Score 0 2 0 0 2  PHQ- 9 Score 0 7 - 0 5    Fall Risk Fall Risk  11/14/2020 10/16/2020 07/31/2020 07/12/2020  Falls in the past year? 1 0 0 0  Number falls in past yr: 0 0 0 0  Comment fell out of truck - - -  Injury with Fall? 0 0 0 0  Risk for fall due to : No Fall Risks No Fall Risks No Fall Risks No Fall Risks  Follow up Falls evaluation completed;Falls prevention discussed Falls evaluation completed Falls evaluation completed Falls evaluation completed    FALL RISK PREVENTION PERTAINING TO THE HOME:  Any stairs in or around the home? Yes  If so, are there any without handrails? No  Home free of loose throw rugs in walkways, pet beds, electrical cords, etc? Yes  Adequate lighting in your home to reduce risk of falls? Yes   ASSISTIVE DEVICES UTILIZED TO PREVENT FALLS:  Life alert? No  Use of a cane, walker or w/c? No  Grab bars in the bathroom? Yes  Shower chair or bench in shower? Yes  Elevated toilet seat or a handicapped toilet? Yes     Cognitive Function:  Normal cognitive status assessed by direct observation by this Nurse Health Advisor. No abnormalities found.        Immunizations Immunization History  Administered Date(s) Administered   [employer] Vaccination 07/29/2019, 08/30/2019    TDAP status: Up to date  Flu Vaccine status: Due, Education has been provided regarding the  importance of this vaccine. Advised may receive this vaccine at local pharmacy or Health Dept. Aware  to provide a copy of the vaccination record if obtained from local pharmacy or Health Dept. Verbalized acceptance and understanding.  Pneumococcal vaccine status: Declined,  Education has been provided regarding the importance of this vaccine but patient still declined. Advised may receive this vaccine at local pharmacy or Health Dept. Aware to provide a copy of the vaccination record if obtained from local pharmacy or Health Dept. Verbalized acceptance and understanding.   Covid-19 vaccine status: Completed vaccines  Qualifies for Shingles Vaccine? Yes   Zostavax completed No   Shingrix Completed?: No.    Education has been provided regarding the importance of this vaccine. Patient has been advised to call insurance company to determine out of pocket expense if they have not yet received this vaccine. Advised may also receive vaccine at local pharmacy or Health Dept. Verbalized acceptance and understanding.  Screening Tests Health Maintenance  Topic Date Due   FOOT EXAM  Never done   OPHTHALMOLOGY EXAM  Never done   TETANUS/TDAP  Never done   Zoster Vaccines- Shingrix (1 of 2) Never done   DEXA SCAN  Never done   PNA vac Low Risk Adult (1 of 2 - PCV13) Never done   COVID-19 Vaccine (3 - Booster for Moderna series) 01/30/2020   INFLUENZA VACCINE  12/24/2020   HEMOGLOBIN A1C  04/19/2021   HPV VACCINES  Aged Out    Health Maintenance  Health Maintenance Due  Topic Date Due   FOOT EXAM  Never done   OPHTHALMOLOGY EXAM  Never done   TETANUS/TDAP  Never done   Zoster Vaccines- Shingrix (1 of 2) Never done   DEXA SCAN  Never done   PNA vac Low Risk Adult (1 of 2 - PCV13) Never done   COVID-19 Vaccine (3 - Booster for Moderna series) 01/30/2020    Colorectal cancer screening: No longer required.   Mammogram status: No longer required due to age.  Bone Density Status: No longer  required   Lung Cancer Screening: (Low Dose CT Chest recommended if Age 38-80 years, 30 pack-year currently smoking OR have quit w/in 15years.) does not qualify.   Lung Cancer Screening Referral: N/A   Additional Screening:  Hepatitis C Screening: does not qualify;   Vision Screening: Recommended annual ophthalmology exams for early detection of glaucoma and other disorders of the eye.2 Is the patient up to date with their annual eye exam?  No  Who is the provider or what is the name of the office in which the patient attends annual eye exams? Dr. Jorja Loa If pt is not established with a provider, would they like to be referred to a provider to establish care? No .   Dental Screening: Recommended annual dental exams for proper oral hygiene  Community Resource Referral / Chronic Care Management: CRR required this visit?  No   CCM required this visit?  No      Plan:     I have personally reviewed and noted the following in the patient's chart:   Medical and social history Use of alcohol, tobacco or illicit drugs  Current medications and supplements including opioid prescriptions. Patient is not currently taking opioid prescriptions. Functional ability and status Nutritional status Physical activity Advanced directives List of other physicians Hospitalizations, surgeries, and ER visits in previous 12 months Vitals Screenings to include cognitive, depression, and falls Referrals and appointments  In addition, I have reviewed and discussed with patient certain preventive protocols, quality metrics, and best practice recommendations. A written personalized care plan  for preventive services as well as general preventive health recommendations were provided to patient.     Ofilia Neas, LPN   2/82/0601   Nurse Notes: None

## 2020-11-14 NOTE — Patient Instructions (Signed)
[name] , Thank you for taking time to come for your Medicare Wellness Visit. I appreciate your ongoing commitment to your health goals. Please review the following plan we discussed and let me know if I can assist you in the future.   Screening recommendations/referrals: Colonoscopy: No longer required  Mammogram: No longer required  Bone Density: No longer required  Recommended yearly ophthalmology/optometry visit for glaucoma screening and checkup Recommended yearly dental visit for hygiene and checkup  Vaccinations: Influenza vaccine: Patient declined  Pneumococcal vaccine: Patient declined  Tdap vaccine: up to date, we will need the record of this vaccine so that we may update your chart Shingles vaccine: Currently due for Shingrix, if you would like to receive we recommend that you do so at your local pharmacy    Advanced directives: Advance directive discussed with you today. Even though you declined this today please call our office should you change your mind and we can give you the proper paperwork for you to fill out.   Conditions/risks identified: None   Next appointment: 11/20/2021 @ 8:20 am with [name] 83 Years and Older, Female Preventive care refers to lifestyle choices and visits with your health care provider that can promote health and wellness. What does preventive care include? A yearly physical exam. This is also called an annual well check. Dental exams once or twice a year. Routine eye exams. Ask your health care provider how often you should have your eyes checked. Personal lifestyle choices, including: Daily care of your teeth and gums. Regular physical activity. Eating a healthy diet. Avoiding tobacco and drug use. Limiting alcohol use. Practicing safe sex. Taking low-dose aspirin every day. Taking vitamin and mineral supplements as recommended by your health care provider. What happens during an annual well  check? The services and screenings done by your health care provider during your annual well check will depend on your age, overall health, lifestyle risk factors, and family history of disease. Counseling  Your health care provider may ask you questions about your: Alcohol use. Tobacco use. Drug use. Emotional well-being. Home and relationship well-being. Sexual activity. Eating habits. History of falls. Memory and ability to understand (cognition). Work and work Statistician. Reproductive health. Screening  You may have the following tests or measurements: Height, weight, and BMI. Blood pressure. Lipid and cholesterol levels. These may be checked every 5 years, or more frequently if you are over 64 years old. Skin check. Lung cancer screening. You may have this screening every year starting at age 45 if you have a 30-pack-year history of smoking and currently smoke or have quit within the past 15 years. Fecal occult blood test (FOBT) of the stool. You may have this test every year starting at age 83. Flexible sigmoidoscopy or colonoscopy. You may have a sigmoidoscopy every 5 years or a colonoscopy every 10 years starting at age 83. Hepatitis C blood test. Hepatitis B blood test. Sexually transmitted disease (STD) testing. Diabetes screening. This is done by checking your blood sugar (glucose) after you have not eaten for a while (fasting). You may have this done every 1-3 years. Bone density scan. This is done to screen for osteoporosis. You may have this done starting at age 83. Mammogram. This may be done every 1-2 years. Talk to your health care provider about how often you should have regular mammograms. Talk with your health care provider about your test results, treatment options, and if necessary, the need for more  tests. Vaccines  Your health care provider may recommend certain vaccines, such as: Influenza vaccine. This is recommended every year. Tetanus, diphtheria, and  acellular pertussis (Tdap, Td) vaccine. You may need a Td booster every 10 years. Zoster vaccine. You may need this after age 53. Pneumococcal 13-valent conjugate (PCV13) vaccine. One dose is recommended after age 12. Pneumococcal polysaccharide (PPSV23) vaccine. One dose is recommended after age 83. Talk to your health care provider about which screenings and vaccines you need and how often you need them. This information is not intended to replace advice given to you by your health care provider. Make sure you discuss any questions you have with your health care provider. Document Released: 06/08/2015 Document Revised: 01/30/2016 Document Reviewed: 03/13/2015 Elsevier Interactive Patient Education  2017 [place] Prevention in the Home Falls can cause injuries. They can happen to people of all ages. There are many things you can do to make your home safe and to help prevent falls. What can I do on the outside of my home? Regularly fix the edges of walkways and driveways and fix any cracks. Remove anything that might make you trip as you walk through a door, such as a raised step or threshold. Trim any bushes or trees on the path to your home. Use bright outdoor lighting. Clear any walking paths of anything that might make someone trip, such as rocks or tools. Regularly check to see if handrails are loose or broken. Make sure that both sides of any steps have handrails. Any raised decks and porches should have guardrails on the edges. Have any leaves, snow, or ice cleared regularly. Use sand or salt on walking paths during winter. Clean up any spills in your garage right away. This includes oil or grease spills. What can I do in the bathroom? Use night lights. Install grab bars by the toilet and in the tub and shower. Do not use towel bars as grab bars. Use non-skid mats or decals in the tub or shower. If you need to sit down in the shower, use a plastic, non-slip stool. Keep  the floor dry. Clean up any water that spills on the floor as soon as it happens. Remove soap buildup in the tub or shower regularly. Attach bath mats securely with double-sided non-slip rug tape. Do not have throw rugs and other things on the floor that can make you trip. What can I do in the bedroom? Use night lights. Make sure that you have a light by your bed that is easy to reach. Do not use any sheets or blankets that are too big for your bed. They should not hang down onto the floor. Have a firm chair that has side arms. You can use this for support while you get dressed. Do not have throw rugs and other things on the floor that can make you trip. What can I do in the kitchen? Clean up any spills right away. Avoid walking on wet floors. Keep items that you use a lot in easy-to-reach places. If you need to reach something above you, use a strong step stool that has a grab bar. Keep electrical cords out of the way. Do not use floor polish or wax that makes floors slippery. If you must use wax, use non-skid floor wax. Do not have throw rugs and other things on the floor that can make you trip. What can I do with my stairs? Do not leave any items on the stairs. Make sure  that there are handrails on both sides of the stairs and use them. Fix handrails that are broken or loose. Make sure that handrails are as long as the stairways. Check any carpeting to make sure that it is firmly attached to the stairs. Fix any carpet that is loose or worn. Avoid having throw rugs at the top or bottom of the stairs. If you do have throw rugs, attach them to the floor with carpet tape. Make sure that you have a light switch at the top of the stairs and the bottom of the stairs. If you do not have them, ask someone to add them for you. What else can I do to help prevent falls? Wear shoes that: Do not have high heels. Have rubber bottoms. Are comfortable and fit you well. Are closed at the toe. Do not  wear sandals. If you use a stepladder: Make sure that it is fully opened. Do not climb a closed stepladder. Make sure that both sides of the stepladder are locked into place. Ask someone to hold it for you, if possible. Clearly mark and make sure that you can see: Any grab bars or handrails. First and last steps. Where the edge of each step is. Use tools that help you move around (mobility aids) if they are needed. These include: Canes. Walkers. Scooters. Crutches. Turn on the lights when you go into a dark area. Replace any light bulbs as soon as they burn out. Set up your furniture so you have a clear path. Avoid moving your furniture around. If any of your floors are uneven, fix them. If there are any pets around you, be aware of where they are. Review your medicines with your doctor. Some medicines can make you feel dizzy. This can increase your chance of falling. Ask your doctor what other things that you can do to help prevent falls. This information is not intended to replace advice given to you by your health care provider. Make sure you discuss any questions you have with your health care provider. Document Released: 03/08/2009 Document Revised: 10/18/2015 Document Reviewed: 06/16/2014 Elsevier Interactive Patient Education  2017 [employer].

## 2021-01-23 ENCOUNTER — Telehealth: Payer: Self-pay | Admitting: *Deleted

## 2021-01-23 NOTE — Chronic Care Management (AMB) (Signed)
  Chronic Care Management   Note  01/23/2021 Name: Gloria Owens MRN: [id number] DOB: [birth year]  [name] is a 85 y.o. year old female who is a primary care patient of Noreene Larsson, NP. I reached out to Jannifer Rodney by phone today in response to a referral sent by Ms. Ladell Pier Magistro's PCP, Noreene Larsson, NP      Ms. Morning was given information about Chronic Care Management services today including:  CCM service includes personalized support from designated clinical staff supervised by her physician, including individualized plan of care and coordination with other care providers 24/7 contact phone numbers for assistance for urgent and routine care needs. Service will only be billed when office clinical staff spend 20 minutes or more in a month to coordinate care. Only one practitioner may furnish and bill the service in a calendar month. The patient may stop CCM services at any time (effective at the end of the month) by phone call to the office staff. The patient will be responsible for cost sharing (co-pay) of up to 20% of the service fee (after annual deductible is met).  Patient agreed to services and verbal consent obtained.   Follow up plan: Telephone appointment with care management team member scheduled for:01/24/21  [facility]  Direct Dial: [phone]

## 2021-01-24 ENCOUNTER — Ambulatory Visit (INDEPENDENT_AMBULATORY_CARE_PROVIDER_SITE_OTHER): Payer: Medicare Other | Admitting: *Deleted

## 2021-01-24 DIAGNOSIS — E1165 Type 2 diabetes mellitus with hyperglycemia: Secondary | ICD-10-CM | POA: Diagnosis not present

## 2021-01-24 DIAGNOSIS — I10 Essential (primary) hypertension: Secondary | ICD-10-CM | POA: Diagnosis not present

## 2021-01-24 NOTE — Chronic Care Management (AMB) (Signed)
Chronic Care Management   CCM RN Visit Note  01/24/2021 Name: Gloria Owens MRN: [id number] DOB: 01/07/1936  Subjective: Gloria Owens is a 85 y.o. year old female who is a primary care patient of Noreene Larsson, NP. The care management team was consulted for assistance with disease management and care coordination needs.    Engaged with patient by telephone for initial visit in response to provider referral for case management and/or care coordination services.   Consent to Services:  The patient was given the following information about Chronic Care Management services today, agreed to services, and gave verbal consent: 1. CCM service includes personalized support from designated clinical staff supervised by the primary care provider, including individualized plan of care and coordination with other care providers 2. 24/7 contact phone numbers for assistance for urgent and routine care needs. 3. Service will only be billed when office clinical staff spend 20 minutes or more in a month to coordinate care. 4. Only one practitioner may furnish and bill the service in a calendar month. 5.The patient may stop CCM services at any time (effective at the end of the month) by phone call to the office staff. 6. The patient will be responsible for cost sharing (co-pay) of up to 20% of the service fee (after annual deductible is met). Patient agreed to services and consent obtained.  Patient agreed to services and verbal consent obtained.   Assessment: Review of patient past medical history, allergies, medications, health status, including review of consultants reports, laboratory and other test data, was performed as part of comprehensive evaluation and provision of chronic care management services.   SDOH (Social Determinants of Health) assessments and interventions performed:  SDOH Interventions    Flowsheet Row Most Recent Value  SDOH Interventions   Food Insecurity Interventions Intervention Not  Indicated  Housing Interventions Intervention Not Indicated  Transportation Interventions Intervention Not Indicated        CCM Care Plan  Allergies  Allergen Reactions   Penicillins     Outpatient Encounter Medications as of 01/24/2021  Medication Sig   aspirin EC 81 MG tablet Take 81 mg by mouth daily.   metFORMIN (GLUCOPHAGE) 500 MG tablet Take 1 tablet (500 mg total) by mouth 2 (two) times daily with a meal.   olmesartan (BENICAR) 20 MG tablet Take 20 mg by mouth daily.   simvastatin (ZOCOR) 40 MG tablet Take 40 mg by mouth daily.   traZODone (DESYREL) 50 MG tablet Take 0.5-1 tablets (25-50 mg total) by mouth at bedtime as needed for sleep. (Patient not taking: No sig reported)   No facility-administered encounter medications on file as of 01/24/2021.    Patient Active Problem List   Diagnosis Date Noted   Back pain 10/16/2020   Unspecified open wound, right lower leg, subsequent encounter 07/31/2020   Encounter to establish care 07/12/2020   Screening due 07/12/2020   Hypertension, essential 07/12/2020   Type II diabetes mellitus, uncontrolled ([place]) 07/12/2020   Hyperlipidemia 07/12/2020   Vitamin D deficiency 07/12/2020    Conditions to be addressed/monitored:HTN and DMII  Care Plan : Diabetes Type 2 (Adult)  Updates made by Kassie Mends, RN since 01/24/2021 12:00 AM     Problem: Glycemic Management (Diabetes, Type 2)   Priority: Medium     Long-Range Goal: Glycemic Management Optimized   Start Date: 01/24/2021  Expected End Date: 07/24/2021  This Visit's Progress: On track  Priority: Medium  Note:   Objective:  Lab Results  Component Value Date   HGBA1C 6.3 (H) 10/17/2020   Lab Results  Component Value Date   CREATININE 1.01 (H) 10/17/2020   CREATININE 0.71 07/12/2020   CREATININE 0.82 06/14/2013   Lab Results  Component Value Date   EGFR 56 (L) 10/17/2020   Current Barriers:  Knowledge Deficits related to basic Diabetes pathophysiology and self  care/management- pt needs reinforcement for management of diabetes- diet, action plan.  Patient reports she lives alone, has no children and her husband passed away this year in 06/10/2022, reports she has friends and family that assist her if needed, pt reports she is independent in all aspects of her care, continues to drive, goes to church, mows her yard, cleans her house, etc.  Pt reports she checks CBG 1-2 times daily with fasting today 143.  Ranges low 100's usually.  Patient reports she is in the process of getting advanced directives completed and working with her nieces on this.   Case Manager Clinical Goal(s):  patient will demonstrate improved adherence to prescribed treatment plan for diabetes self care/management as evidenced by: adherence to ADA/ carb modified diet adherence to prescribed medication regimen contacting provider for new or worsened symptoms or questions Interventions:  Collaboration with Noreene Larsson, NP regarding development and update of comprehensive plan of care as evidenced by provider attestation and co-signature Inter-disciplinary care team collaboration (see longitudinal plan of care) Provided education to patient about basic DM disease process Reviewed medications with patient and discussed importance of medication adherence Discussed plans with patient for ongoing care management follow up and provided patient with direct contact information for care management team Reviewed scheduled/upcoming provider appointments including: Demetrius Revel NP 11/28 Review of patient status, including review of consultants reports, relevant laboratory and other test results, and medications completed. Education mailed- hypoglycemia Reviewed carbohydrate modified/ ADA diet and food choices that will elevate blood sugar Self-Care Activities Checks blood sugars as prescribed and utilize hyper and hypoglycemia protocol as needed Adheres to prescribed ADA/carb modified Patient Goals: -  check blood sugar at prescribed times - check blood sugar if I feel it is too high or too low - enter blood sugar readings and medication or insulin into daily log - take the blood sugar log to all doctor visits - take the blood sugar meter to all doctor visits - take all medications as prescribed - follow up with primary care doctor on 11/28 - please read education mailed- hypoglycemia - change to whole grain breads, cereal, pasta - drink 6 to 8 glasses of water each day - fill half of plate with vegetables - read food labels for fat, fiber, carbohydrates and portion size - check feet daily for cuts, sores or redness - trim toenails straight across - wash and dry feet carefully every day - wear comfortable, cotton socks - wear comfortable, well-fitting shoes Follow Up Plan: Telephone follow up appointment with care management team member scheduled for:   03/21/2021    Care Plan : Hypertension (Adult)  Updates made by Kassie Mends, RN since 01/24/2021 12:00 AM     Problem: Hypertension (Hypertension)   Priority: Medium     Long-Range Goal: Hypertension Monitored   Start Date: 01/24/2021  Expected End Date: 07/24/2021  This Visit's Progress: On track  Priority: Medium  Note:   Objective:  Last practice recorded BP readings:  BP Readings from Last 3 Encounters:  10/16/20 133/70  07/31/20 116/62  07/26/20 117/67   Most recent eGFR/CrCl:  Lab Results  Component  Value Date   EGFR 56 (L) 10/17/2020    No components found for: CRCL Current Barriers:  Knowledge Deficits related to basic understanding of hypertension pathophysiology and self care management-  patient reports she had wrist cuff that recently broke and she is in process of getting a new BP cuff and will start monitoring blood pressure again. Case Manager Clinical Goal(s):  patient will verbalize understanding of plan for hypertension management patient will demonstrate improved adherence to prescribed treatment  plan for hypertension as evidenced by taking all medications as prescribed, monitoring and recording blood pressure as directed, adhering to low sodium/DASH diet Interventions:  Collaboration with Noreene Larsson, NP regarding development and update of comprehensive plan of care as evidenced by provider attestation and co-signature Inter-disciplinary care team collaboration (see longitudinal plan of care) Evaluation of current treatment plan related to hypertension self management and patient's adherence to plan as established by provider. Provided education to patient re: stroke prevention, s/s of heart attack and stroke, DASH diet, complications of uncontrolled blood pressure Reviewed medications with patient and discussed importance of compliance Discussed plans with patient for ongoing care management follow up and provided patient with direct contact information for care management team Advised patient, providing education and rationale, to monitor blood pressure daily and record, calling PCP for findings outside established parameters.  Reviewed scheduled/upcoming provider appointments including:   Demetrius Revel NP 11/28 Mailed education to patient- low sodium diet Encouraged patient to continue getting outside daily and working in her yard, reminded to stay well hydrated Self-Care Activities:  Attends all scheduled provider appointments Calls provider office for new concerns, questions, or BP outside discussed parameters Checks BP and records as discussed Follows a low sodium diet/DASH diet Patient Goals: - check blood pressure 3 times per week - write blood pressure results in a log or diary  - please look over education mailed- low sodium diet - please avoid salty snacks and fast food - continue working in your yard and being active, keep up the good work Follow Up Plan: Telephone follow up appointment with care management team member scheduled for:   03/21/2021     Plan:Telephone  follow up appointment with care management team member scheduled for:  03/21/2021  Jacqlyn Larsen [facility], BSN RN Case Manager [facility] [phone]

## 2021-01-24 NOTE — Patient Instructions (Signed)
Visit Information   PATIENT GOALS:   Goals Addressed             This Visit's Progress    Monitor and Manage My Blood Sugar-Diabetes Type 2       Timeframe:  Long-Range Goal Priority:  Medium Start Date:      01/24/2021                       Expected End Date:     07/24/2020                  Follow Up Date 03/21/2021   - check blood sugar at prescribed times - check blood sugar if I feel it is too high or too low - enter blood sugar readings and medication or insulin into daily log - take the blood sugar log to all doctor visits - take the blood sugar meter to all doctor visits  - take all medications as prescribed - follow up with primary care doctor on 11/28 - please read education mailed- hypoglycemia    Why is this important?   Checking your blood sugar at home helps to keep it from getting very high or very low.  Writing the results in a diary or log helps the doctor know how to care for you.  Your blood sugar log should have the time, date and the results.  Also, write down the amount of insulin or other medicine that you take.  Other information, like what you ate, exercise done and how you were feeling, will also be helpful.     Notes:      Track and Manage My Blood Pressure-Hypertension       Timeframe:  Long-Range Goal Priority:  Medium Start Date:    01/24/2021                         Expected End Date:       07/24/2021                Follow Up Date 03/21/2021   - check blood pressure 3 times per week - write blood pressure results in a log or diary  - please look over education mailed- low sodium diet - please avoid salty snacks and fast food - continue working in your yard and being active, keep up the good work    Why is this important?   You won't feel high blood pressure, but it can still hurt your blood vessels.  High blood pressure can cause heart or kidney problems. It can also cause a stroke.  Making lifestyle changes like losing a little weight or  eating less salt will help.  Checking your blood pressure at home and at different times of the day can help to control blood pressure.  If the doctor prescribes medicine remember to take it the way the doctor ordered.  Call the office if you cannot afford the medicine or if there are questions about it.     Notes:         Consent to CCM Services: Ms. Yepez was given information about Chronic Care Management services including:  CCM service includes personalized support from designated clinical staff supervised by her physician, including individualized plan of care and coordination with other care providers 24/7 contact phone numbers for assistance for urgent and routine care needs. Service will only be billed when office clinical staff spend 20 minutes or more  in a month to coordinate care. Only one practitioner may furnish and bill the service in a calendar month. The patient may stop CCM services at any time (effective at the end of the month) by phone call to the office staff. The patient will be responsible for cost sharing (co-pay) of up to 20% of the service fee (after annual deductible is met).  Patient agreed to services and verbal consent obtained.   The patient verbalized understanding of instructions, educational materials, and care plan provided today and agreed to receive a mailed copy of patient instructions, educational materials, and care plan.   Telephone follow up appointment with care management team member scheduled for:   10/27/2022Low-Sodium Eating Plan Sodium, which is an element that makes up salt, helps you maintain a healthy balance of fluids in your body. Too much sodium can increase your blood pressure and cause fluid and waste to be held in your body. Your health care provider or dietitian may recommend following this plan if you have high blood pressure (hypertension), kidney disease, liver disease, or heart failure. Eating less sodium can help lower your  blood pressure, reduce swelling, and protect your heart, liver, and kidneys. What are tips for following this plan? Reading food labels The Nutrition Facts label lists the amount of sodium in one serving of the food. If you eat more than one serving, you must multiply the listed amount of sodium by the number of servings. Choose foods with less than 140 mg of sodium per serving. Avoid foods with 300 mg of sodium or more per serving. Shopping  Look for lower-sodium products, often labeled as "low-sodium" or "no salt added." Always check the sodium content, even if foods are labeled as "unsalted" or "no salt added." Buy fresh foods. Avoid canned foods and pre-made or frozen meals. Avoid canned, cured, or processed meats. Buy breads that have less than 80 mg of sodium per slice. Cooking  Eat more home-cooked food and less restaurant, buffet, and fast food. Avoid adding salt when cooking. Use salt-free seasonings or herbs instead of table salt or sea salt. Check with your health care provider or pharmacist before using salt substitutes. Cook with plant-based oils, such as canola, sunflower, or olive oil. Meal planning When eating at a restaurant, ask that your food be prepared with less salt or no salt, if possible. Avoid dishes labeled as brined, pickled, cured, smoked, or made with soy sauce, miso, or teriyaki sauce. Avoid foods that contain MSG (monosodium glutamate). MSG is sometimes added to [place] food, bouillon, and some canned foods. Make meals that can be grilled, baked, poached, roasted, or steamed. These are generally made with less sodium. General information Most people on this plan should limit their sodium intake to 1,500-2,000 mg (milligrams) of sodium each day. What foods should I eat? Fruits Fresh, frozen, or canned fruit. Fruit juice. Vegetables Fresh or frozen vegetables. "No salt added" canned vegetables. "No salt added" tomato sauce and paste. Low-sodium or  reduced-sodium tomato and vegetable juice. Grains Low-sodium cereals, including oats, puffed wheat and rice, and shredded wheat. Low-sodium crackers. Unsalted rice. Unsalted pasta. Low-sodium bread. Whole-grain breads and whole-grain pasta. Meats and other proteins Fresh or frozen (no salt added) meat, poultry, seafood, and fish. Low-sodium canned tuna and salmon. Unsalted nuts. Dried peas, beans, and lentils without added salt. Unsalted canned beans. Eggs. Unsalted nut butters. Dairy Milk. Soy milk. Cheese that is naturally low in sodium, such as ricotta cheese, fresh mozzarella, or Swiss cheese. Low-sodium or  reduced-sodium cheese. Cream cheese. Yogurt. Seasonings and condiments Fresh and dried herbs and spices. Salt-free seasonings. Low-sodium mustard and ketchup. Sodium-free salad dressing. Sodium-free light mayonnaise. Fresh or refrigerated horseradish. Lemon juice. Vinegar. Other foods Homemade, reduced-sodium, or low-sodium soups. Unsalted popcorn and pretzels. Low-salt or salt-free chips. The items listed above may not be a complete list of foods and beverages you can eat. Contact a dietitian for more information. What foods should I avoid? Vegetables Sauerkraut, pickled vegetables, and relishes. Olives. [place] fries. Onion rings. Regular canned vegetables (not low-sodium or reduced-sodium). Regular canned tomato sauce and paste (not low-sodium or reduced-sodium). Regular tomato and vegetable juice (not low-sodium or reduced-sodium). Frozen vegetables in sauces. Grains Instant hot cereals. Bread stuffing, pancake, and biscuit mixes. Croutons. Seasoned rice or pasta mixes. Noodle soup cups. Boxed or frozen macaroni and cheese. Regular salted crackers. Self-rising flour. Meats and other proteins Meat or fish that is salted, canned, smoked, spiced, or pickled. Precooked or cured meat, such as sausages or meat loaves. Berniece Salines. Ham. Pepperoni. Hot dogs. Corned beef. Chipped beef. Salt pork. Jerky.  Pickled herring. Anchovies and sardines. Regular canned tuna. Salted nuts. Dairy Processed cheese and cheese spreads. Hard cheeses. Cheese curds. Blue cheese. Feta cheese. String cheese. Regular cottage cheese. Buttermilk. Canned milk. Fats and oils Salted butter. Regular margarine. Ghee. Bacon fat. Seasonings and condiments Onion salt, garlic salt, seasoned salt, table salt, and sea salt. Canned and packaged gravies. Worcestershire sauce. Tartar sauce. Barbecue sauce. Teriyaki sauce. Soy sauce, including reduced-sodium. Steak sauce. Fish sauce. Oyster sauce. Cocktail sauce. Horseradish that you find on the shelf. Regular ketchup and mustard. Meat flavorings and tenderizers. Bouillon cubes. Hot sauce. Pre-made or packaged marinades. Pre-made or packaged taco seasonings. Relishes. Regular salad dressings. Salsa. Other foods Salted popcorn and pretzels. Corn chips and puffs. Potato and tortilla chips. Canned or dried soups. Pizza. Frozen entrees and pot pies. The items listed above may not be a complete list of foods and beverages you should avoid. Contact a dietitian for more information. Summary Eating less sodium can help lower your blood pressure, reduce swelling, and protect your heart, liver, and kidneys. Most people on this plan should limit their sodium intake to 1,500-2,000 mg (milligrams) of sodium each day. Canned, boxed, and frozen foods are high in sodium. Restaurant foods, fast foods, and pizza are also very high in sodium. You also get sodium by adding salt to food. Try to cook at home, eat more fresh fruits and vegetables, and eat less fast food and canned, processed, or prepared foods. This information is not intended to replace advice given to you by your health care provider. Make sure you discuss any questions you have with your health care provider. Document Revised: 06/17/2019 Document Reviewed: 04/13/2019 Elsevier Patient Education  2022 [place]. Hypoglycemia Hypoglycemia is when the sugar (glucose) level in your blood is too low. Low blood sugar can happen to people who have diabetes and people who do not have diabetes. Low blood sugar can happen quickly, and it can be an emergency. What are the causes? This condition happens most often in people who have diabetes. It may be caused by: Diabetes medicine. Not eating enough, or not eating often enough. Doing more physical activity. Drinking alcohol on an empty stomach. If you do not have diabetes, this condition may be caused by: A tumor in the pancreas. Not eating enough, or not eating for long periods at a time (fasting). A very bad infection or illness. Problems after having weight loss (  bariatric) surgery. Kidney failure or liver failure. Certain medicines. What increases the risk? This condition is more likely to develop in people who: Have diabetes and take medicines to lower their blood sugar. Abuse alcohol. Have a very bad illness. What are the signs or symptoms? Mild Hunger. Sweating and feeling clammy. Feeling dizzy or light-headed. Being sleepy or having trouble sleeping. Feeling like you may vomit (nauseous). A fast heartbeat. A headache. Blurry vision. Mood changes, such as: Being grouchy. Feeling worried or nervous (anxious). Tingling or loss of feeling (numbness) around your mouth, lips, or tongue. Moderate Confusion and poor judgment. Behavior changes. Weakness. Uneven heartbeat. Trouble with moving (coordination). Very low Very low blood sugar (severe hypoglycemia) is a medical emergency. It can cause: Fainting. Seizures. Loss of consciousness (coma). Death. How is this treated? Treating low blood sugar Low blood sugar is often treated by eating or drinking something that has sugar in it right away. The food or drink should contain 15 grams of a fast-acting carb (carbohydrate). Options include: 4 oz (120 mL) of fruit juice. 4 oz (120 mL)  of regular soda (not diet soda). A few pieces of hard candy. Check food labels to see how many pieces to eat for 15 grams. 1 Tbsp (15 mL) of sugar or honey. 4 glucose tablets. 1 tube of glucose gel. Treating low blood sugar if you have diabetes If you can think clearly and swallow safely, follow the 15:15 rule: Take 15 grams of a fast-acting carb. Talk with your doctor about how much you should take. Always keep a source of fast-acting carb with you, such as: Glucose tablets (take 4 tablets). A few pieces of hard candy. Check food labels to see how many pieces to eat for 15 grams. 4 oz (120 mL) of fruit juice. 4 oz (120 mL) of regular soda (not diet soda). 1 Tbsp (15 mL) of honey or sugar. 1 tube of glucose gel. Check your blood sugar 15 minutes after you take the carb. If your blood sugar is still at or below 70 mg/dL (3.9 mmol/L), take 15 grams of a carb again. If your blood sugar does not go above 70 mg/dL (3.9 mmol/L) after 3 tries, get help right away. After your blood sugar goes back to normal, eat a meal or a snack within 1 hour.  Treating very low blood sugar If your blood sugar is below 54 mg/dL (3 mmol/L), you have very low blood sugar, or severe hypoglycemia. This is an emergency. Get medical help right away. If you have very low blood sugar and you cannot eat or drink, you will need to be given a hormone called glucagon. A family member or friend should learn how to check your blood sugar and how to give you glucagon. Ask your doctor if you need to have an emergency glucagon kit at home. Very low blood sugar may also need to be treated in a hospital. Follow these instructions at home: General instructions Take over-the-counter and prescription medicines only as told by your doctor. Stay aware of your blood sugar as told by your doctor. If you drink alcohol: Limit how much you have to: 0-1 drink a day for women who are not pregnant. 0-2 drinks a day for men. Know how much  alcohol is in your drink. In the [place] one drink equals one 12 oz bottle of beer (355 mL), one 5 oz glass of wine (148 mL), or one 1 oz glass of hard liquor (44 mL). Be sure to eat  food when you drink alcohol. Know that your body absorbs alcohol quickly. This may lead to low blood sugar later. Be sure to keep checking your blood sugar. Keep all follow-up visits. If you have diabetes:  Always have a fast-acting carb (15 grams) with you to treat low blood sugar. Follow your diabetes care plan as told by your doctor. Make sure you: Know the symptoms of low blood sugar. Check your blood sugar as often as told. Always check it before and after exercise. Always check your blood sugar before you drive. Take your medicines as told. Follow your meal plan. Eat on time. Do not skip meals. Share your diabetes care plan with: Your work or school. People you live with. Carry a card or wear jewelry that says you have diabetes. Where to find more information American Diabetes Association: www.diabetes.org Contact a doctor if: You have trouble keeping your blood sugar in your target range. You have low blood sugar often. Get help right away if: You still have symptoms after you eat or drink something that contains 15 grams of fast-acting carb, and you cannot get your blood sugar above 70 mg/dL by following the 15:15 rule. Your blood sugar is below 54 mg/dL (3 mmol/L). You have a seizure. You faint. These symptoms may be an emergency. Get help right away. Call your local emergency services (911 in the [place] Do not wait to see if the symptoms will go away. Do not drive yourself to the hospital. Summary Hypoglycemia happens when the level of sugar (glucose) in your blood is too low. Low blood sugar can happen to people who have diabetes and people who do not have diabetes. Low blood sugar can happen quickly, and it can be an emergency. Make sure you know the symptoms of low blood sugar and know how  to treat it. Always keep a source of sugar (fast-acting carb) with you to treat low blood sugar. This information is not intended to replace advice given to you by your health care provider. Make sure you discuss any questions you have with your health care provider. Document Revised: 04/12/2020 Document Reviewed: 04/12/2020 Elsevier Patient Education  2022 [facility], BSN RN Case Manager [facility] [phone]   CLINICAL CARE PLAN: Patient Care Plan: Diabetes Type 2 (Adult)     Problem Identified: Glycemic Management (Diabetes, Type 2)   Priority: Medium     Long-Range Goal: Glycemic Management Optimized   Start Date: 01/24/2021  Expected End Date: 07/24/2021  This Visit's Progress: On track  Priority: Medium  Note:   Objective:  Lab Results  Component Value Date   HGBA1C 6.3 (H) 10/17/2020   Lab Results  Component Value Date   CREATININE 1.01 (H) 10/17/2020   CREATININE 0.71 07/12/2020   CREATININE 0.82 06/14/2013   Lab Results  Component Value Date   EGFR 56 (L) 10/17/2020   Current Barriers:  Knowledge Deficits related to basic Diabetes pathophysiology and self care/management- pt needs reinforcement for management of diabetes- diet, action plan.  Patient reports she lives alone, has no children and her husband passed away this year in 06-03-2022, reports she has friends and family that assist her if needed, pt reports she is independent in all aspects of her care, continues to drive, goes to church, mows her yard, cleans her house, etc.  Pt reports she checks CBG 1-2 times daily with fasting today 143.  Ranges low 100's usually.  Patient reports she is in the process  of getting advanced directives completed and working with her nieces on this.   Case Manager Clinical Goal(s):  patient will demonstrate improved adherence to prescribed treatment plan for diabetes self care/management as evidenced by: adherence to ADA/ carb modified diet  adherence to prescribed medication regimen contacting provider for new or worsened symptoms or questions Interventions:  Collaboration with Noreene Larsson, NP regarding development and update of comprehensive plan of care as evidenced by provider attestation and co-signature Inter-disciplinary care team collaboration (see longitudinal plan of care) Provided education to patient about basic DM disease process Reviewed medications with patient and discussed importance of medication adherence Discussed plans with patient for ongoing care management follow up and provided patient with direct contact information for care management team Reviewed scheduled/upcoming provider appointments including: Demetrius Revel NP 11/28 Review of patient status, including review of consultants reports, relevant laboratory and other test results, and medications completed. Education mailed- hypoglycemia Reviewed carbohydrate modified/ ADA diet and food choices that will elevate blood sugar Self-Care Activities Checks blood sugars as prescribed and utilize hyper and hypoglycemia protocol as needed Adheres to prescribed ADA/carb modified Patient Goals: - check blood sugar at prescribed times - check blood sugar if I feel it is too high or too low - enter blood sugar readings and medication or insulin into daily log - take the blood sugar log to all doctor visits - take the blood sugar meter to all doctor visits - take all medications as prescribed - follow up with primary care doctor on 11/28 - please read education mailed- hypoglycemia - change to whole grain breads, cereal, pasta - drink 6 to 8 glasses of water each day - fill half of plate with vegetables - read food labels for fat, fiber, carbohydrates and portion size - check feet daily for cuts, sores or redness - trim toenails straight across - wash and dry feet carefully every day - wear comfortable, cotton socks - wear comfortable, well-fitting  shoes Follow Up Plan: Telephone follow up appointment with care management team member scheduled for:   03/21/2021    Patient Care Plan: Hypertension (Adult)     Problem Identified: Hypertension (Hypertension)   Priority: Medium     Long-Range Goal: Hypertension Monitored   Start Date: 01/24/2021  Expected End Date: 07/24/2021  This Visit's Progress: On track  Priority: Medium  Note:   Objective:  Last practice recorded BP readings:  BP Readings from Last 3 Encounters:  10/16/20 133/70  07/31/20 116/62  07/26/20 117/67   Most recent eGFR/CrCl:  Lab Results  Component Value Date   EGFR 56 (L) 10/17/2020    No components found for: CRCL Current Barriers:  Knowledge Deficits related to basic understanding of hypertension pathophysiology and self care management-  patient reports she had wrist cuff that recently broke and she is in process of getting a new BP cuff and will start monitoring blood pressure again. Case Manager Clinical Goal(s):  patient will verbalize understanding of plan for hypertension management patient will demonstrate improved adherence to prescribed treatment plan for hypertension as evidenced by taking all medications as prescribed, monitoring and recording blood pressure as directed, adhering to low sodium/DASH diet Interventions:  Collaboration with Noreene Larsson, NP regarding development and update of comprehensive plan of care as evidenced by provider attestation and co-signature Inter-disciplinary care team collaboration (see longitudinal plan of care) Evaluation of current treatment plan related to hypertension self management and patient's adherence to plan as established by provider. Provided education to patient re:  stroke prevention, s/s of heart attack and stroke, DASH diet, complications of uncontrolled blood pressure Reviewed medications with patient and discussed importance of compliance Discussed plans with patient for ongoing care management  follow up and provided patient with direct contact information for care management team Advised patient, providing education and rationale, to monitor blood pressure daily and record, calling PCP for findings outside established parameters.  Reviewed scheduled/upcoming provider appointments including:   Demetrius Revel NP 11/28 Mailed education to patient- low sodium diet Encouraged patient to continue getting outside daily and working in her yard, reminded to stay well hydrated Self-Care Activities:  Attends all scheduled provider appointments Calls provider office for new concerns, questions, or BP outside discussed parameters Checks BP and records as discussed Follows a low sodium diet/DASH diet Patient Goals: - check blood pressure 3 times per week - write blood pressure results in a log or diary  - please look over education mailed- low sodium diet - please avoid salty snacks and fast food - continue working in your yard and being active, keep up the good work Follow Up Plan: Telephone follow up appointment with care management team member scheduled for:   03/21/2021

## 2021-03-21 ENCOUNTER — Telehealth: Payer: Medicare Other

## 2021-03-26 ENCOUNTER — Telehealth: Payer: Medicare Other

## 2021-04-04 ENCOUNTER — Telehealth: Payer: Self-pay | Admitting: *Deleted

## 2021-04-04 NOTE — Chronic Care Management (AMB) (Signed)
  Care Management   Note  04/04/2021 Name: [name] MRN: [id number] DOB: [birth year]  Gloria Owens is a 85 y.o. year old female who is a primary care patient of Noreene Larsson, NP and is actively engaged with the care management team. I reached out to Jannifer Rodney by phone today to assist with re-scheduling a follow up visit with the RN Case Manager  Follow up plan: Unsuccessful telephone outreach attempt made. A HIPAA compliant phone message was left for the patient providing contact information and requesting a return call.  The care management team will reach out to the patient again over the next 7 days.  If patient returns call to provider office, please advise to call Arma at [phone].  [facility]  Direct Dial: [phone]

## 2021-04-05 NOTE — Chronic Care Management (AMB) (Signed)
  Care Management   Note  04/05/2021 Name: COLINE CALKIN MRN: [id number] DOB: [birth year]  ELAYSHA BEVARD is a 85 y.o. year old female who is a primary care patient of Noreene Larsson, NP and is actively engaged with the care management team. I reached out to Jannifer Rodney by phone today to assist with re-scheduling a follow up visit with the RN Case Manager  Follow up plan: Telephone appointment with care management team member scheduled for:04/09/21 [place] [facility]  Direct Dial: [phone]

## 2021-04-09 ENCOUNTER — Ambulatory Visit (INDEPENDENT_AMBULATORY_CARE_PROVIDER_SITE_OTHER): Payer: Medicare Other | Admitting: *Deleted

## 2021-04-09 DIAGNOSIS — E1165 Type 2 diabetes mellitus with hyperglycemia: Secondary | ICD-10-CM

## 2021-04-09 DIAGNOSIS — I10 Essential (primary) hypertension: Secondary | ICD-10-CM

## 2021-04-09 NOTE — Chronic Care Management (AMB) (Signed)
Chronic Care Management   CCM RN Visit Note  04/09/2021 Name: CECEILIA CEPHUS MRN: [id number] DOB: [birth year]  Subjective: Gloria Owens is a 85 y.o. year old female who is a primary care patient of Noreene Larsson, NP. The care management team was consulted for assistance with disease management and care coordination needs.    Engaged with patient by telephone for follow up visit in response to provider referral for case management and/or care coordination services.   Consent to Services:  The patient was given information about Chronic Care Management services, agreed to services, and gave verbal consent prior to initiation of services.  Please see initial visit note for detailed documentation.   Patient agreed to services and verbal consent obtained.   Assessment: Review of patient past medical history, allergies, medications, health status, including review of consultants reports, laboratory and other test data, was performed as part of comprehensive evaluation and provision of chronic care management services.   SDOH (Social Determinants of Health) assessments and interventions performed:    CCM Care Plan  Allergies  Allergen Reactions   Penicillins     Outpatient Encounter Medications as of 04/09/2021  Medication Sig   aspirin EC 81 MG tablet Take 81 mg by mouth daily.   metFORMIN (GLUCOPHAGE) 500 MG tablet Take 1 tablet (500 mg total) by mouth 2 (two) times daily with a meal.   olmesartan (BENICAR) 20 MG tablet Take 20 mg by mouth daily.   simvastatin (ZOCOR) 40 MG tablet Take 40 mg by mouth daily.   traZODone (DESYREL) 50 MG tablet Take 0.5-1 tablets (25-50 mg total) by mouth at bedtime as needed for sleep. (Patient not taking: Reported on 04/09/2021)   No facility-administered encounter medications on file as of 04/09/2021.    Patient Active Problem List   Diagnosis Date Noted   Back pain 10/16/2020   Unspecified open wound, right lower leg, subsequent encounter  07/31/2020   Encounter to establish care 07/12/2020   Screening due 07/12/2020   Hypertension, essential 07/12/2020   Type II diabetes mellitus, uncontrolled 07/12/2020   Hyperlipidemia 07/12/2020   Vitamin D deficiency 07/12/2020    Conditions to be addressed/monitored:HTN and DMII     Care Plan : RN Care Manager plan of care  Updates made by Kassie Mends, RN since 04/09/2021 12:00 AM     Problem: No plan of care established for management of chronic disease states (DM2, HTN)   Priority: High     Long-Range Goal: Development of plan of care for chronic disease management (DM2, HTN)   Priority: High  Note:    Current Barriers:  Knowledge Deficits related to plan of care for management of HTN and DMII  Knowledge Deficits related to basic understanding of hypertension pathophysiology and self care management-  patient reports she had wrist cuff that recently broke and she got a new wrist cuff and has had some problems with this, reports she is having her niece to look at the wrist cuff today and make sure checking blood pressure correctly. Knowledge Deficits related to basic Diabetes pathophysiology and self care/management- pt needs reinforcement for management of diabetes- diet, action plan.  Patient reports she lives alone, has no children and her husband passed away this year in 2022/06/20, reports she has friends and family that assist her if needed, pt reports she is independent in all aspects of her care, continues to drive, goes to church, mows her yard, cleans her house, etc.  Pt reports she checks  CBG 1-2 times daily with ranges 108-130.  Ranges low 100's usually.  Patient reports she is in the process of getting advanced directives completed and working with her nieces on this.    RNCM Clinical Goal(s):  Patient will verbalize understanding of plan for management of HTN and DMII as evidenced by pt report, review EHR. attend all scheduled medical appointments: 04/22/21 primary  care provider  as evidenced by pt report , review EHR and        through collaboration with RN Care manager, provider, and care team.   Interventions: 1:1 collaboration with primary care provider regarding development and update of comprehensive plan of care as evidenced by provider attestation and co-signature Inter-disciplinary care team collaboration (see longitudinal plan of care) Evaluation of current treatment plan related to  self management and patient's adherence to plan as established by provider  Hypertension Interventions: Last practice recorded BP readings:  BP Readings from Last 3 Encounters:  10/16/20 133/70  07/31/20 116/62  07/26/20 117/67  Most recent eGFR/CrCl:  Lab Results  Component Value Date   EGFR 56 (L) 10/17/2020    No components found for: CRCL  Evaluation of current treatment plan related to hypertension self management and patient's adherence to plan as established by provider; Reviewed medications with patient and discussed importance of compliance; Reviewed scheduled/upcoming provider appointments including:  Discussed complications of poorly controlled blood pressure such as heart disease, stroke, circulatory complications, vision complications, kidney impairment, sexual dysfunction;  Encouraged pt to check blood pressure at least 3 x per week Ask pt to have niece check her new wrist cuff as pt has had problems with new cuff Encouraged flu vaccine Reinforced low sodium diet  Diabetes Interventions: Assessed patient's understanding of A1c goal: <7% Reviewed medications with patient and discussed importance of medication adherence; Counseled on importance of regular laboratory monitoring as prescribed; Review of patient status, including review of consultants reports, relevant laboratory and other test results, and medications completed; Reinforced carbohydrate modified diet and food choices Reviewed exercise in the management of controlling blood sugar  levels Reviewed upcoming scheduled appointments Lab Results  Component Value Date   HGBA1C 6.3 (H) 10/17/2020    Patient Goals/Self-Care Activities: Patient will attend all scheduled provider appointments as evidenced by clinician review of documented attendance to scheduled appointments and patient/caregiver report Patient will attend church or other social activities as evidenced by patient report Patient will continue to perform ADL's independently as evidenced by patient/caregiver report Patient will continue to perform IADL's independently as evidenced by patient/caregiver report Patient will call provider office for new concerns or questions as evidenced by review of documented incoming telephone call notes and patient report check blood sugar at prescribed times: once daily and twice daily check feet daily for cuts, sores or redness enter blood sugar readings and medication or insulin into daily log take the blood sugar log to all doctor visits take the blood sugar meter to all doctor visits trim toenails straight across fill half of plate with vegetables - check blood pressure 3 times per week - choose a place to take my blood pressure (home, clinic or office, retail store) - keep a blood pressure log - take blood pressure log to all doctor appointments - keep all doctor appointments - eat more whole grains, fruits and vegetables, lean meats and healthy fats   Follow Up Plan:  Telephone follow up appointment with care management team member scheduled for:  06/11/2021       Plan:Telephone follow up  appointment with care management team member scheduled for:  06/11/2021  Jacqlyn Larsen [facility], BSN RN Case Manager [facility] [phone]

## 2021-04-09 NOTE — Patient Instructions (Signed)
Visit Information  Patient will attend all scheduled provider appointments as evidenced by clinician review of documented attendance to scheduled appointments and patient/caregiver report Patient will attend church or other social activities as evidenced by patient report Patient will continue to perform ADL's independently as evidenced by patient/caregiver report Patient will continue to perform IADL's independently as evidenced by patient/caregiver report Patient will call provider office for new concerns or questions as evidenced by review of documented incoming telephone call notes and patient report check blood sugar at prescribed times: once daily and twice daily check feet daily for cuts, sores or redness enter blood sugar readings and medication or insulin into daily log take the blood sugar log to all doctor visits take the blood sugar meter to all doctor visits trim toenails straight across fill half of plate with vegetables - check blood pressure 3 times per week - choose a place to take my blood pressure (home, clinic or office, retail store) - keep a blood pressure log - take blood pressure log to all doctor appointments - keep all doctor appointments - eat more whole grains, fruits and vegetables, lean meats and healthy fats  The patient verbalized understanding of instructions, educational materials, and care plan provided today and declined offer to receive copy of patient instructions, educational materials, and care plan.   Telephone follow up appointment with care management team member scheduled for:  06/11/2021  Jacqlyn Larsen [facility], BSN RN Case Manager [facility] [phone]

## 2021-04-16 ENCOUNTER — Other Ambulatory Visit: Payer: Self-pay

## 2021-04-16 ENCOUNTER — Telehealth: Payer: Medicare Other

## 2021-04-16 DIAGNOSIS — R7301 Impaired fasting glucose: Secondary | ICD-10-CM | POA: Diagnosis not present

## 2021-04-16 DIAGNOSIS — I10 Essential (primary) hypertension: Secondary | ICD-10-CM | POA: Diagnosis not present

## 2021-04-16 DIAGNOSIS — E785 Hyperlipidemia, unspecified: Secondary | ICD-10-CM | POA: Diagnosis not present

## 2021-04-17 LAB — LIPID PANEL
Chol/HDL Ratio: 2.5 ratio (ref 0.0–4.4)
Cholesterol, Total: 166 mg/dL (ref 100–199)
HDL: 66 mg/dL (ref >39)
LDL Chol Calc (NIH): 81 mg/dL (ref 0–99)
Triglycerides: 104 mg/dL (ref 0–149)
VLDL Cholesterol Cal: 19 mg/dL (ref 5–40)

## 2021-04-17 LAB — CMP14+EGFR
ALT: 12 IU/L (ref 0–32)
AST: 18 IU/L (ref 0–40)
Albumin/Globulin Ratio: 2 (ref 1.2–2.2)
Albumin: 4.5 g/dL (ref 3.6–4.6)
Alkaline Phosphatase: 55 IU/L (ref 44–121)
BUN/Creatinine Ratio: 20 (ref 12–28)
BUN: 18 mg/dL (ref 8–27)
Bilirubin Total: 0.4 mg/dL (ref 0.0–1.2)
CO2: 25 mmol/L (ref 20–29)
Calcium: 9.5 mg/dL (ref 8.7–10.3)
Chloride: 100 mmol/L (ref 96–106)
Creatinine, Ser: 0.92 mg/dL (ref 0.57–1.00)
Globulin, Total: 2.2 g/dL (ref 1.5–4.5)
Glucose: 126 mg/dL — ABNORMAL HIGH (ref 70–99)
Potassium: 4.9 mmol/L (ref 3.5–5.2)
Sodium: 138 mmol/L (ref 134–144)
Total Protein: 6.7 g/dL (ref 6.0–8.5)
eGFR: 62 mL/min/{1.73_m2} (ref >59)

## 2021-04-17 LAB — CBC
Hematocrit: 34.9 % (ref 34.0–46.6)
Hemoglobin: 11.3 g/dL (ref 11.1–15.9)
MCH: 28.8 pg (ref 26.6–33.0)
MCHC: 32.4 g/dL (ref 31.5–35.7)
MCV: 89 fL (ref 79–97)
Platelets: 145 10*3/uL — ABNORMAL LOW (ref 150–450)
RBC: 3.92 x10E6/uL (ref 3.77–5.28)
RDW: 12.8 % (ref 11.7–15.4)
WBC: 6.4 10*3/uL (ref 3.4–10.8)

## 2021-04-17 LAB — HEMOGLOBIN A1C
Est. average glucose Bld gHb Est-mCnc: 134 mg/dL
Hgb A1c MFr Bld: 6.3 % — ABNORMAL HIGH (ref 4.8–5.6)

## 2021-04-19 ENCOUNTER — Ambulatory Visit: Payer: Medicare Other | Admitting: Nurse Practitioner

## 2021-04-22 ENCOUNTER — Ambulatory Visit (INDEPENDENT_AMBULATORY_CARE_PROVIDER_SITE_OTHER): Payer: Medicare Other | Admitting: Nurse Practitioner

## 2021-04-22 ENCOUNTER — Encounter: Payer: Self-pay | Admitting: Nurse Practitioner

## 2021-04-22 ENCOUNTER — Ambulatory Visit: Payer: Medicare Other | Admitting: Nurse Practitioner

## 2021-04-22 ENCOUNTER — Other Ambulatory Visit: Payer: Self-pay

## 2021-04-22 VITALS — BP 113/62 | HR 83 | Ht 64.5 in | Wt 146.0 lb

## 2021-04-22 DIAGNOSIS — E785 Hyperlipidemia, unspecified: Secondary | ICD-10-CM

## 2021-04-22 DIAGNOSIS — E559 Vitamin D deficiency, unspecified: Secondary | ICD-10-CM | POA: Diagnosis not present

## 2021-04-22 DIAGNOSIS — Z139 Encounter for screening, unspecified: Secondary | ICD-10-CM

## 2021-04-22 DIAGNOSIS — E119 Type 2 diabetes mellitus without complications: Secondary | ICD-10-CM

## 2021-04-22 DIAGNOSIS — I10 Essential (primary) hypertension: Secondary | ICD-10-CM

## 2021-04-22 DIAGNOSIS — R252 Cramp and spasm: Secondary | ICD-10-CM

## 2021-04-22 NOTE — Assessment & Plan Note (Signed)
Well controlled. Continue simvastatin 40mg  daily

## 2021-04-22 NOTE — Assessment & Plan Note (Addendum)
Bilateral leg , feet cramping.  Takes tylenol as needed for pain. Will f/u with back surgeon (Dr Ellene Route) if needed.

## 2021-04-22 NOTE — Assessment & Plan Note (Addendum)
Well controlled. Continue metformin 500mg  BID. This SmartLink has not been configured with any valid records.    Lab Results  Component Value Date   HGBA1C 6.3 (H) 04/16/2021  Diabetic eye exam next Jan 30,2023.  Foot exam done today, importance of wearing comfortable shoes and regular self foot exam discussed with pt.

## 2021-04-22 NOTE — Assessment & Plan Note (Signed)
Well controlled . Continue omelsartan. \ BP Readings from Last 3 Encounters:  04/22/21 113/62  10/16/20 133/70  07/31/20 116/62

## 2021-04-22 NOTE — Patient Instructions (Signed)
Follow up in 6 months Please get your pneumonia, and shingles vaccine at your pharmacy.  Get your labs done 3-5 days before your next visit.     It is important that you exercise regularly at least 30 minutes 5 times a week.  Think about what you will eat, plan ahead. Choose " clean, green, fresh or frozen" over canned, processed or packaged foods which are more sugary, salty and fatty. 70 to 75% of food eaten should be vegetables and fruit. Three meals at set times with snacks allowed between meals, but they must be fruit or vegetables. Aim to eat over a 12 hour period , example 7 am to 7 pm, and STOP after  your last meal of the day. Drink water,generally about 64 ounces per day, no other drink is as healthy. Fruit juice is best enjoyed in a healthy way, by EATING the fruit.  Thanks for choosing [facility], we consider it a privelige to serve you.

## 2021-04-22 NOTE — Progress Notes (Signed)
   Gloria Owens     MRN: [id number]      DOB: 1935-12-22   HPI Ms. Lansdale is here for follow up and re-evaluation of chronic medical conditions, medication management and review of any available recent lab and radiology data.  Preventive health is updated, specifically  Cancer screening and Immunization.   Questions or concerns regarding consultations or procedures which the PT has had in the interim are  addressed. The PT denies any adverse reactions to current medications since the last visit.    Pt c/o chronic intermittent cramping  in both legs, feet, toes, calf area, symptoms started getting worse  3 months. Cramps comes at nighttime when she is laying down, when she gets up walking the cramping goes away. She has had 12 back surgeries . Takes Tylenol if she has to take pain med.  Pt encouraged to get her PNA, COVID , Shingles vacc at her pharmacy. She stated that she had TDAP vaccine at the hospital in March this year . TDAP admin was not found in her DC summary.   ROS Denies recent fever or chills.  Denies sinus pressure, nasal congestion, ear pain or sore throat. Denies chest congestion, productive cough or wheezing. Denies chest pains, palpitations and leg swelling Denies abdominal pain, nausea, vomiting,diarrhea or constipation.   Denies dysuria, frequency, hesitancy or incontinence. Denies joint pain, swelling and limitation in mobility. Denies headaches, seizures, numbness, or tingling.Has cramping Denies depression, anxiety or insomnia. Denies skin break down or rash.   PE  BP 113/62 (BP Location: Left Arm, Patient Position: Sitting, Cuff Size: Normal)   Pulse 83   Ht 5' 4.5" (1.638 m)   Wt 146 lb (66.2 kg)   SpO2 98%   BMI 24.67 kg/m   Patient alert and oriented and in no cardiopulmonary distress.  HEENT: No facial asymmetry, EOMI,     Neck supple .  Chest: Clear to auscultation bilaterally.  CVS: S1, S2 no murmurs, no S3.Regular rate.  ABD: Soft non tender.    Ext: No edema  MS: Adequate ROM spine, shoulders, hips and knees.  Skin: Intact, no ulcerations or rash noted.  Psych: Good eye contact, normal affect. Memory intact not anxious or depressed appearing.  CNS: CN 2-12 intact, power,  normal throughout.no focal deficits noted.   Assessment & Plan

## 2021-04-24 DIAGNOSIS — I10 Essential (primary) hypertension: Secondary | ICD-10-CM | POA: Diagnosis not present

## 2021-04-24 DIAGNOSIS — E1165 Type 2 diabetes mellitus with hyperglycemia: Secondary | ICD-10-CM

## 2021-04-24 DIAGNOSIS — Z7984 Long term (current) use of oral hypoglycemic drugs: Secondary | ICD-10-CM

## 2021-05-13 ENCOUNTER — Ambulatory Visit (INDEPENDENT_AMBULATORY_CARE_PROVIDER_SITE_OTHER): Payer: Medicare Other | Admitting: Nurse Practitioner

## 2021-05-13 ENCOUNTER — Other Ambulatory Visit: Payer: Self-pay

## 2021-05-13 ENCOUNTER — Encounter: Payer: Self-pay | Admitting: Nurse Practitioner

## 2021-05-13 VITALS — BP 126/59 | HR 88 | Temp 98.3°F | Ht 64.5 in | Wt 146.0 lb

## 2021-05-13 DIAGNOSIS — R630 Anorexia: Secondary | ICD-10-CM | POA: Insufficient documentation

## 2021-05-13 DIAGNOSIS — R34 Anuria and oliguria: Secondary | ICD-10-CM

## 2021-05-13 DIAGNOSIS — I10 Essential (primary) hypertension: Secondary | ICD-10-CM

## 2021-05-13 DIAGNOSIS — K5900 Constipation, unspecified: Secondary | ICD-10-CM

## 2021-05-13 MED ORDER — MIRTAZAPINE 7.5 MG PO TABS: 7.5000 mg | ORAL_TABLET | Freq: Every day | ORAL | 1 refills | Status: DC

## 2021-05-13 MED ORDER — GLUCERNA SHAKE PO LIQD: 237.0000 mL | Freq: Three times a day (TID) | ORAL | 2 refills | Status: DC

## 2021-05-13 MED ORDER — POLYETHYLENE GLYCOL 3350 17 GM/SCOOP PO POWD: 17.0000 g | Freq: Every day | ORAL | 1 refills | Status: DC | PRN

## 2021-05-13 NOTE — Assessment & Plan Note (Signed)
Pt had low urine output, pressure in her bladder and vagina pain  for 9 days despite drinking plenty of fluids.  referral to urology.

## 2021-05-13 NOTE — Patient Instructions (Addendum)
°  For constipation it is important that you have an adequate intake of fruit and vegetables daily, at least 3 servings of each, as well as water intake of at least 48 ounces daily and regular exercise. OTC stool softeners are helpful for daily use, up to 4 daily (eg. Colace) Fiber intake daily is needed, in the form of Bran or Shredded Wheat   Take mirtazapine 7.5mg  daily for your low appetite. Miralax as needed for constipation. You have been referred to urology due to trouble with passing urine.    It is important that you exercise regularly at least 30 minutes 5 times a week.  Think about what you will eat, plan ahead. Choose " clean, green, fresh or frozen" over canned, processed or packaged foods which are more sugary, salty and fatty. 70 to 75% of food eaten should be vegetables and fruit. Three meals at set times with snacks allowed between meals, but they must be fruit or vegetables. Aim to eat over a 12 hour period , example 7 am to 7 pm, and STOP after  your last meal of the day. Drink water,generally about 64 ounces per day, no other drink is as healthy. Fruit juice is best enjoyed in a healthy way, by EATING the fruit.  Thanks for choosing [facility], we consider it a privelige to serve you.

## 2021-05-13 NOTE — Progress Notes (Signed)
° °  Gloria Owens     MRN: [id number]      DOB: [birth year]   HPI Gloria Owens is here for c/o chronic  constipation and acute  low urine output. Pt did not move her bowels for 9 days, she normally moves her bowel about every 2 days. She had drips of urine and pressure in her bladder for 9 days. Had a soft small BM yesterday, color was dark, she has been passing gas., she takes stool softener as needed. She has been urinating well without problems since Friday, previously for 9 days she was having pain with low urine out put.  .She stated that she  has a cyst removed from behind her  bladder in the past.  Pt c/o of low appetites since her husband died early this year, she takes ensure at home.   ROS Denies recent fever or chills, has decreased appetite since loosing her husband.  Denies sinus pressure, nasal congestion, ear pain or sore throat. Denies chest congestion, productive cough or wheezing. Denies chest pains, palpitations and leg swelling Denies abdominal pain, nausea, vomiting,diarrhea or   Had constipation , now resolved.  Has decreased urine output with fullness and pain  in her bladder  Denies joint pain, swelling and limitation in mobility. Denies headaches, seizures, numbness, or tingling. Denies depression, anxiety or insomnia. Denies skin break down or rash.   PE  BP (!) 126/59 (BP Location: Left Arm, Patient Position: Sitting, Cuff Size: Normal)    Pulse 88    Temp 98.3 F (36.8 C) (Oral)    Ht 5' 4.5" (1.638 m)    Wt 146 lb (66.2 kg)    SpO2 97%    BMI 24.67 kg/m   Patient alert and oriented and in no cardiopulmonary distress.  HEENT: No facial asymmetry, EOMI,     Neck supple .  Chest: Clear to auscultation bilaterally.  CVS: S1, S2 no murmurs, no S3.Regular rate.  ABD: Soft non tender.   Ext: No edema  MS: Adequate ROM spine, shoulders, hips and knees.  Skin: Intact, no ulcerations or rash noted.  Psych: Good eye contact, normal affect. Memory intact not  anxious or depressed appearing.  CNS: CN 2-12 intact, power,  normal throughout.no focal deficits noted.   Assessment & Plan

## 2021-05-13 NOTE — Assessment & Plan Note (Signed)
Take miralax as needed for constipation. Continue soft softer, increase fiber intake and drink plenty of water to stay hydrated.

## 2021-05-13 NOTE — Assessment & Plan Note (Signed)
Loss of appetite since she lost her husband. Start Remeron 7.5mg  daily.  Glucerna TID between meals.

## 2021-05-13 NOTE — Assessment & Plan Note (Signed)
DASH diet and commitment to daily physical activity for a minimum of 30 minutes discussed and encouraged, as a part of hypertension management. The importance of attaining a healthy weight is also discussed.  BP/Weight 05/13/2021 04/22/2021 11/14/2020 10/16/2020 07/31/2020 07/26/2020 2/86/3817  Systolic BP 711 657 - 903 833 383 291  Diastolic BP 59 62 - 70 62 67 73  Wt. (Lbs) 146 146 - 146 143 142 145  BMI 24.67 24.67 - 24.67 23.8 23.63 24.13

## 2021-05-14 LAB — CMP14+EGFR
ALT: 11 IU/L (ref 0–32)
AST: 16 IU/L (ref 0–40)
Albumin/Globulin Ratio: 1.8 (ref 1.2–2.2)
Albumin: 4.2 g/dL (ref 3.6–4.6)
Alkaline Phosphatase: 71 IU/L (ref 44–121)
BUN/Creatinine Ratio: 17 (ref 12–28)
BUN: 15 mg/dL (ref 8–27)
Bilirubin Total: <0.2 mg/dL (ref 0.0–1.2)
CO2: 23 mmol/L (ref 20–29)
Calcium: 9.7 mg/dL (ref 8.7–10.3)
Chloride: 101 mmol/L (ref 96–106)
Creatinine, Ser: 0.9 mg/dL (ref 0.57–1.00)
Globulin, Total: 2.4 g/dL (ref 1.5–4.5)
Glucose: 111 mg/dL — ABNORMAL HIGH (ref 70–99)
Potassium: 4.8 mmol/L (ref 3.5–5.2)
Sodium: 138 mmol/L (ref 134–144)
Total Protein: 6.6 g/dL (ref 6.0–8.5)
eGFR: 63 mL/min/{1.73_m2} (ref >59)

## 2021-06-11 ENCOUNTER — Ambulatory Visit (INDEPENDENT_AMBULATORY_CARE_PROVIDER_SITE_OTHER): Payer: Medicare Other | Admitting: *Deleted

## 2021-06-11 DIAGNOSIS — E119 Type 2 diabetes mellitus without complications: Secondary | ICD-10-CM

## 2021-06-11 DIAGNOSIS — I10 Essential (primary) hypertension: Secondary | ICD-10-CM

## 2021-06-11 NOTE — Patient Instructions (Signed)
Visit Information  Thank you for taking time to visit with me today. Please don't hesitate to contact me if I can be of assistance to you before our next scheduled telephone appointment.  Following are the goals we discussed today:  Attend all scheduled provider appointments Attend church or other social activities Perform all self care activities independently  Perform IADL's (shopping, preparing meals, housekeeping, managing finances) independently Call provider office for new concerns or questions  check blood sugar at prescribed times: once daily and twice daily take the blood sugar log to all doctor visits take the blood sugar meter to all doctor visits drink 6 to 8 glasses of water each day read food labels for fat, fiber, carbohydrates and portion size keep feet up while sitting check blood pressure weekly choose a place to take my blood pressure (home, clinic or office, retail store) keep a blood pressure log take blood pressure log to all doctor appointments keep all doctor appointments eat more whole grains, fruits and vegetables, lean meats and healthy fats Follow up with Dr. Felipa Eth on 06/13/21 Stay in touch with your doctor about any urinary, bladder, constipation and pain issues Ask your family and friends for help as needed  Our next appointment is by telephone on 07/09/21 at 10 am  Please call the care guide team at [phone] if you need to cancel or reschedule your appointment.   If you are experiencing a Mental Health or [place] or need someone to talk to, please call the Suicide and Crisis Lifeline: 988 call the [place] National Suicide Prevention Lifeline: 5414612568 or TTY: 437-242-6986 TTY 405-841-7236) to talk to a trained counselor call 1-800-273-TALK (toll free, 24 hour hotline) go to [facility] [address] [place] [phone]) call the [place]: [phone] call 911    Patient verbalizes understanding of instructions and care plan provided today and agrees to view in [place]. Active MyChart status confirmed with patient.    Jacqlyn Larsen [facility], BSN RN Case Manager [facility] [phone]

## 2021-06-11 NOTE — Chronic Care Management (AMB) (Signed)
Chronic Care Management   CCM RN Visit Note  06/11/2021 Name: ZARAY GATCHEL MRN: [id number] DOB: [birth year]  Subjective: Gloria Owens is a 86 y.o. year old female who is a primary care patient of Renee Rival, FNP. The care management team was consulted for assistance with disease management and care coordination needs.    Engaged with patient by telephone for follow up visit in response to provider referral for case management and/or care coordination services.   Consent to Services:  The patient was given information about Chronic Care Management services, agreed to services, and gave verbal consent prior to initiation of services.  Please see initial visit note for detailed documentation.   Patient agreed to services and verbal consent obtained.   Assessment: Review of patient past medical history, allergies, medications, health status, including review of consultants reports, laboratory and other test data, was performed as part of comprehensive evaluation and provision of chronic care management services.   SDOH (Social Determinants of Health) assessments and interventions performed:    CCM Care Plan  Allergies  Allergen Reactions   Penicillins     Outpatient Encounter Medications as of 06/11/2021  Medication Sig   aspirin EC 81 MG tablet Take 81 mg by mouth daily.   cholecalciferol (VITAMIN D3) 25 MCG (1000 UNIT) tablet Take 2,000 Units by mouth daily.   metFORMIN (GLUCOPHAGE) 500 MG tablet Take 1 tablet (500 mg total) by mouth 2 (two) times daily with a meal.   olmesartan (BENICAR) 20 MG tablet Take 20 mg by mouth daily.   simvastatin (ZOCOR) 40 MG tablet Take 40 mg by mouth daily.   feeding supplement, GLUCERNA SHAKE, (GLUCERNA SHAKE) LIQD Take 237 mLs by mouth 3 (three) times daily between meals. (Patient not taking: Reported on 06/11/2021)   mirtazapine (REMERON) 7.5 MG tablet Take 1 tablet (7.5 mg total) by mouth at bedtime.   polyethylene glycol powder  (GLYCOLAX/MIRALAX) 17 GM/SCOOP powder Take 17 g by mouth daily as needed.   No facility-administered encounter medications on file as of 06/11/2021.    Patient Active Problem List   Diagnosis Date Noted   Constipation 05/13/2021   Urine output low 05/13/2021   Loss of appetite for more than 2 weeks 05/13/2021   Leg cramping 04/22/2021   Back pain 10/16/2020   Unspecified open wound, right lower leg, subsequent encounter 07/31/2020   Encounter to establish care 07/12/2020   Screening due 07/12/2020   Hypertension, essential 07/12/2020   Diabetes mellitus without complication ([place]) 66/10/43   Hyperlipidemia 07/12/2020   Vitamin D deficiency 07/12/2020    Conditions to be addressed/monitored:HTN and DMII  Care Plan : RN Care Manager plan of care  Updates made by Kassie Mends, RN since 06/11/2021 12:00 AM     Problem: No plan of care established for management of chronic disease states (DM2, HTN)   Priority: High     Long-Range Goal: Development of plan of care for chronic disease management (DM2, HTN)   Priority: High  Note:    Current Barriers:  Knowledge Deficits related to plan of care for management of HTN and DMII  Knowledge Deficits related to basic understanding of hypertension pathophysiology and self care management-  patient reports she had wrist cuff that recently broke and she got a new wrist cuff and has had some problems with this and states " this cuff doesn't work and I'm not going to use" , reports not checking blood pressure at home. Knowledge Deficits related to basic  Diabetes pathophysiology and self care/management- pt needs reinforcement for management of diabetes- diet, action plan.  Patient reports she lives alone, has no children and her husband passed away in 06/16/20, reports she has friends and family that assist her if needed, pt reports she is independent in all aspects of her care, continues to drive, goes to church, mows her yard, cleans her  house, etc.  Pt reports she checks CBG 1-2 times daily with ranges 119-130,  Ranges low 100's usually.  Patient reports she completed advanced directives and named her niece as HCPOA (did this with lawyer's office).  Patient reports back in December 2022 she has issues with severe constipation, difficulty urinating and severe pain, (abdomen, pelvic area), she saw primary care provider and is feeling "some better but it's still going on"  still having pain in her side , sometimes pelvic/ bladder area and will be following up with Dr. Felipa Eth on 06/13/21 for urinary issues and hopefully have tests completed.  RNCM Clinical Goal(s):  Patient will verbalize understanding of plan for management of HTN and DMII as evidenced by pt report, review EHR. attend all scheduled medical appointments: 06/13/21 Dr. Felipa Eth  as evidenced by pt report , review EHR and        through collaboration with RN Care manager, provider, and care team.   Interventions: 1:1 collaboration with primary care provider regarding development and update of comprehensive plan of care as evidenced by provider attestation and co-signature Inter-disciplinary care team collaboration (see longitudinal plan of care) Evaluation of current treatment plan related to  self management and patient's adherence to plan as established by provider  Hypertension Interventions: Last practice recorded BP readings:  BP Readings from Last 3 Encounters:  10/16/20 133/70  07/31/20 116/62  07/26/20 117/67  Most recent eGFR/CrCl:  Lab Results  Component Value Date   EGFR 56 (L) 10/17/2020    No components found for: CRCL  Evaluation of current treatment plan related to hypertension self management and patient's adherence to plan as established by provider Reviewed medications with patient and discussed importance of compliance Counseled on the importance of exercise goals with target of 150 minutes per week Pain assessment completed Encouraged pt  to check blood pressure at least 1 x per week Reviewed low sodium diet and food choices  Diabetes Interventions: Assessed patient's understanding of A1c goal: <7% Reviewed medications with patient and discussed importance of medication adherence Discussed plans with patient for ongoing care management follow up and provided patient with direct contact information for care management team Review of patient status, including review of consultants reports, relevant laboratory and other test results, and medications completed Reviewed carbohydrate modified diet and food choices Reinforced exercise in the management of controlling blood sugar levels Reviewed upcoming scheduled appointments Dr. Felipa Eth 06/13/21 Reviewed pain management strategies and importance of relaxation Lab Results  Component Value Date   HGBA1C 6.3 (H) 10/17/2020    Patient Goals/Self-Care Activities: Attend all scheduled provider appointments Attend church or other social activities Perform all self care activities independently  Perform IADL's (shopping, preparing meals, housekeeping, managing finances) independently Call provider office for new concerns or questions  check blood sugar at prescribed times: once daily and twice daily take the blood sugar log to all doctor visits take the blood sugar meter to all doctor visits drink 6 to 8 glasses of water each day read food labels for fat, fiber, carbohydrates and portion size keep feet up while sitting check blood pressure weekly choose a  place to take my blood pressure (home, clinic or office, retail store) keep a blood pressure log take blood pressure log to all doctor appointments keep all doctor appointments eat more whole grains, fruits and vegetables, lean meats and healthy fats Follow up with Dr. Felipa Eth on 06/13/21 Stay in touch with your doctor about any urinary, bladder, constipation and pain issues Ask your family and friends for help as needed    Follow Up Plan:  Telephone follow up appointment with care management team member scheduled for:  07/09/21       Plan:Telephone follow up appointment with care management team member scheduled for:  07/09/21  Jacqlyn Larsen [facility], BSN RN Case Manager [facility] [phone]

## 2021-06-13 ENCOUNTER — Other Ambulatory Visit: Payer: Self-pay

## 2021-06-13 ENCOUNTER — Ambulatory Visit: Payer: Medicare Other | Admitting: Urology

## 2021-06-13 ENCOUNTER — Encounter: Payer: Self-pay | Admitting: Urology

## 2021-06-13 VITALS — BP 145/69 | HR 91 | Wt 146.6 lb

## 2021-06-13 DIAGNOSIS — R339 Retention of urine, unspecified: Secondary | ICD-10-CM | POA: Diagnosis not present

## 2021-06-13 DIAGNOSIS — R34 Anuria and oliguria: Secondary | ICD-10-CM

## 2021-06-13 DIAGNOSIS — N393 Stress incontinence (female) (male): Secondary | ICD-10-CM | POA: Insufficient documentation

## 2021-06-13 DIAGNOSIS — R8290 Unspecified abnormal findings in urine: Secondary | ICD-10-CM | POA: Diagnosis not present

## 2021-06-13 LAB — MICROSCOPIC EXAMINATION
Epithelial Cells (non renal): >10 /hpf — AB (ref 0–10)
WBC, UA: >30 /hpf — AB (ref 0–5)

## 2021-06-13 LAB — URINALYSIS, ROUTINE W REFLEX MICROSCOPIC
Bilirubin, UA: NEGATIVE
Glucose, UA: NEGATIVE
Ketones, UA: NEGATIVE
Nitrite, UA: NEGATIVE
RBC, UA: NEGATIVE
Specific Gravity, UA: 1.01 (ref 1.005–1.030)
Urobilinogen, Ur: 0.2 mg/dL (ref 0.2–1.0)
pH, UA: 5.5 (ref 5.0–7.5)

## 2021-06-13 LAB — BLADDER SCAN AMB NON-IMAGING: Scan Result: 274

## 2021-06-13 NOTE — Progress Notes (Signed)
Assessment: 1. Incomplete emptying of bladder   2. SUI (stress urinary incontinence, female)   3. Abnormal urine findings     Plan: Urine culture sent today Will call with results  Timed and double voiding Return to office in 2-3 weeks with bladder scan  Chief Complaint:  Chief Complaint  Patient presents with   Urinary Retention    History of Present Illness:  Gloria Owens is a 86 y.o. year old female who is seen in consultation from Renee Rival, FNP for evaluation of voiding difficulty.  She reports acute onset of inability to void or defecate for approximately 6 days.  This episode was 2-3 weeks ago.  She did not seek medical attention at that time.  She has since resumed voiding and having bowel movements.  She does report some left lower quadrant pain with radiation to the left flank and back area only occurring at night.  This is relieved with voiding after approximately 1 hour.  No dysuria or gross hematuria.  She has nocturia 3-4 times.  She does report stress incontinence and some intermittent stream.  She is using 2 incontinence pads per day.  She does report a prior history of UTIs but no recent symptoms.   Past Medical History:  Past Medical History:  Diagnosis Date   Hypercholesteremia    Seizures ([place])    with eye surgery several years ago-unknown etiology-no meds    Past Surgical History:  Past Surgical History:  Procedure Laterality Date   ABDOMINAL HYSTERECTOMY     total hysterectomy   BACK SURGERY     fusion of back x2 and 1 other back surgery   CHOLECYSTECTOMY     COLON RESECTION     from endometriosis   CYSTOSCOPY     fulgeration of tumors   HERNIA REPAIR Right    INGUINAL HERNIA REPAIR Left 06/20/2013   Procedure: HERNIA REPAIR INGUINAL ADULT;  Surgeon: Jamesetta So, MD;  Location: [facility];  Service: General;  Laterality: Left;   INSERTION OF MESH Left 06/20/2013   Procedure: INSERTION OF MESH;  Surgeon: Jamesetta So, MD;  Location:  [facility];  Service: General;  Laterality: Left;    Allergies:  Allergies  Allergen Reactions   Penicillins     Family History:  No family history on file.  Social History:  Social History   Tobacco Use   Smoking status: Never   Smokeless tobacco: Never  Vaping Use   Vaping Use: Never used  Substance Use Topics   Alcohol use: No   Drug use: No    Review of symptoms:  Constitutional:  Negative for unexplained weight loss, night sweats, fever, chills ENT:  Negative for nose bleeds, sinus pain, painful swallowing CV:  Negative for chest pain, shortness of breath, exercise intolerance, palpitations, loss of consciousness Resp:  Negative for cough, wheezing, shortness of breath GI:  Negative for nausea, vomiting, diarrhea, bloody stools GU:  Positives noted in HPI; otherwise negative for gross hematuria, dysuria Neuro:  Negative for seizures, poor balance, limb weakness, slurred speech Psych:  Negative for lack of energy, depression, anxiety Endocrine:  Negative for polydipsia, polyuria, symptoms of hypoglycemia (dizziness, hunger, sweating) Hematologic:  Negative for anemia, purpura, petechia, prolonged or excessive bleeding, use of anticoagulants  Allergic:  Negative for difficulty breathing or choking as a result of exposure to anything; no shellfish allergy; no allergic response (rash/itch) to materials, foods  Physical exam: BP (!) 145/69    Pulse 91  Wt 146 lb 9.6 oz (66.5 kg)    BMI 24.78 kg/m  GENERAL APPEARANCE:  Well appearing, well developed, well nourished, NAD HEENT: Atraumatic, Normocephalic, oropharynx clear. NECK: Supple without lymphadenopathy or thyromegaly. LUNGS: Clear to auscultation bilaterally. HEART: Regular Rate and Rhythm without murmurs, gallops, or rubs. ABDOMEN: Soft, non-tender, No Masses. EXTREMITIES: Moves all extremities well.  Without clubbing, cyanosis, or edema. NEUROLOGIC:  Alert and oriented x 3, normal gait, CN II-XII grossly intact.   MENTAL STATUS:  Appropriate. BACK:  Non-tender to palpation.  No CVAT SKIN:  Warm, dry and intact.   GU: Urethra: no masses, NT; leakage with cough and valsalva Vagina: atrophic changes; no significant cystocele   Results: U/A:  3-10 RBC, >30 WBC, >10 epithelial cells, many bacteria, nitrite negative  Results for orders placed or performed in visit on 06/13/21 (from the past 24 hour(s))  BLADDER SCAN AMB NON-IMAGING   Collection Time: 06/13/21  2:10 PM  Result Value Ref Range   Scan Result 274 ml    I&O cath for residual:  450 ml

## 2021-06-13 NOTE — Progress Notes (Signed)
Urological Symptom Review  Patient is experiencing the following symptoms: Get up at night to urinate Leakage of urine Stream starts and stops   Review of Systems  Gastrointestinal (upper)  : Negative for upper GI symptoms  Gastrointestinal (lower) : Negative for lower GI symptoms  Constitutional : Weight loss Fatigue  Skin: Negative for skin symptoms  Eyes: Negative for eye symptoms  Ear/Nose/Throat : Negative for Ear/Nose/Throat symptoms  Hematologic/Lymphatic: Negative for Hematologic/Lymphatic symptoms  Cardiovascular : Negative for cardiovascular symptoms  Respiratory : Negative for respiratory symptoms  Endocrine: Negative for endocrine symptoms  Musculoskeletal: Back pain  Neurological: Negative for neurological symptoms  Psychologic: Negative for psychiatric symptoms

## 2021-06-13 NOTE — Progress Notes (Signed)
post void residual=221ml

## 2021-06-17 LAB — URINE CULTURE

## 2021-06-18 ENCOUNTER — Telehealth: Payer: Self-pay

## 2021-06-18 MED ORDER — SULFAMETHOXAZOLE-TRIMETHOPRIM 800-160 MG PO TABS: 1.0000 | ORAL_TABLET | Freq: Two times a day (BID) | ORAL | 0 refills | Status: AC

## 2021-06-18 NOTE — Addendum Note (Signed)
Addended by: Primus Bravo on: 06/18/2021 08:29 AM   Modules accepted: Orders

## 2021-06-18 NOTE — Telephone Encounter (Signed)
Patient called and notified of medication sent to pharmacy. Voiced understanding.

## 2021-06-18 NOTE — Telephone Encounter (Signed)
-----   Message from Primus Bravo, MD sent at 06/18/2021  8:29 AM EST ----- Please notify patient that her urine culture did show evidence of UTI.  A prescription for Bactrim DS twice daily for 5 days was sent to her pharmacy for treatment.  Keep follow-up as scheduled.

## 2021-06-18 NOTE — Telephone Encounter (Signed)
Patient called this am to ask if any medication has been sent to patient pharmacy for recent urine culture.  Message sent to MD.

## 2021-06-24 DIAGNOSIS — E119 Type 2 diabetes mellitus without complications: Secondary | ICD-10-CM | POA: Diagnosis not present

## 2021-06-24 LAB — HM DIABETES EYE EXAM

## 2021-06-25 DIAGNOSIS — E1169 Type 2 diabetes mellitus with other specified complication: Secondary | ICD-10-CM | POA: Diagnosis not present

## 2021-06-25 DIAGNOSIS — Z7984 Long term (current) use of oral hypoglycemic drugs: Secondary | ICD-10-CM

## 2021-06-25 DIAGNOSIS — I10 Essential (primary) hypertension: Secondary | ICD-10-CM | POA: Diagnosis not present

## 2021-07-04 ENCOUNTER — Encounter: Payer: Self-pay | Admitting: Urology

## 2021-07-04 ENCOUNTER — Other Ambulatory Visit: Payer: Self-pay

## 2021-07-04 ENCOUNTER — Ambulatory Visit (INDEPENDENT_AMBULATORY_CARE_PROVIDER_SITE_OTHER): Payer: Medicare Other | Admitting: Urology

## 2021-07-04 VITALS — BP 111/52 | HR 87 | Wt 142.0 lb

## 2021-07-04 DIAGNOSIS — N393 Stress incontinence (female) (male): Secondary | ICD-10-CM

## 2021-07-04 DIAGNOSIS — R339 Retention of urine, unspecified: Secondary | ICD-10-CM | POA: Diagnosis not present

## 2021-07-04 DIAGNOSIS — Z87440 Personal history of urinary (tract) infections: Secondary | ICD-10-CM

## 2021-07-04 DIAGNOSIS — R8290 Unspecified abnormal findings in urine: Secondary | ICD-10-CM

## 2021-07-04 LAB — URINALYSIS, ROUTINE W REFLEX MICROSCOPIC
Bilirubin, UA: NEGATIVE
Glucose, UA: NEGATIVE
Ketones, UA: NEGATIVE
Nitrite, UA: POSITIVE — AB
Protein,UA: NEGATIVE
Specific Gravity, UA: 1.01 (ref 1.005–1.030)
Urobilinogen, Ur: 0.2 mg/dL (ref 0.2–1.0)
pH, UA: 5.5 (ref 5.0–7.5)

## 2021-07-04 LAB — MICROSCOPIC EXAMINATION: Renal Epithel, UA: NONE SEEN /hpf

## 2021-07-04 LAB — BLADDER SCAN AMB NON-IMAGING: Scan Result: 135

## 2021-07-04 NOTE — Progress Notes (Signed)
post void residual=135 

## 2021-07-04 NOTE — Progress Notes (Signed)
Assessment: 1. Incomplete emptying of bladder   2. History of UTI   3. SUI (stress urinary incontinence, female)   4. Abnormal urine findings      Plan: Urine culture sent today - will call with results Continue timed and double voiding Kegel exercise instructions given Return to office 6 weeks  Chief Complaint:  Chief Complaint  Patient presents with   incomplete emptying    History of Present Illness:  Gloria Owens is a 86 y.o. year old female who is seen for further evaluation of voiding difficulty.  At her initial last 33, she reported acute onset of inability to void or defecate for approximately 6 days.  This episode was 2-3 weeks prior to her visit.  She did not seek medical attention at that time.  She  resumed voiding and having bowel movements.  She reported some left lower quadrant pain with radiation to the left flank and back area only occurring at night.  This was relieved with voiding after approximately 1 hour.  No dysuria or gross hematuria.  She ported nocturia 3-4 times, stress incontinence and some intermittent stream.  She was using 2 incontinence pads per day.  She has a prior history of UTIs but no recent symptoms. I&O cath demonstrated 450 mL.  Pelvic exam did not demonstrate significant prolapse. Urine culture grew >100 K E. coli.  She was treated with Bactrim.  She returns today for follow-up.  She is feeling much better.  Her urinary symptoms have improved.  She feels like she is emptying her bladder more completely.  Her stress incontinence has decreased.  No dysuria or gross hematuria.  Portions of the above documentation were copied from a prior visit for review purposes only.   Past Medical History:  Past Medical History:  Diagnosis Date   Hypercholesteremia    Seizures ([place])    with eye surgery several years ago-unknown etiology-no meds    Past Surgical History:  Past Surgical History:  Procedure Laterality Date   ABDOMINAL HYSTERECTOMY      total hysterectomy   BACK SURGERY     fusion of back x2 and 1 other back surgery   CHOLECYSTECTOMY     COLON RESECTION     from endometriosis   CYSTOSCOPY     fulgeration of tumors   HERNIA REPAIR Right    INGUINAL HERNIA REPAIR Left 06/20/2013   Procedure: HERNIA REPAIR INGUINAL ADULT;  Surgeon: Jamesetta So, MD;  Location: [facility];  Service: General;  Laterality: Left;   INSERTION OF MESH Left 06/20/2013   Procedure: INSERTION OF MESH;  Surgeon: Jamesetta So, MD;  Location: [facility];  Service: General;  Laterality: Left;    Allergies:  Allergies  Allergen Reactions   Penicillins     Family History:  History reviewed. No pertinent family history.  Social History:  Social History   Tobacco Use   Smoking status: Never   Smokeless tobacco: Never  Vaping Use   Vaping Use: Never used  Substance Use Topics   Alcohol use: No   Drug use: No    ROS: Constitutional:  Negative for fever, chills, weight loss CV: Negative for chest pain, previous MI, hypertension Respiratory:  Negative for shortness of breath, wheezing, sleep apnea, frequent cough GI:  Negative for nausea, vomiting, bloody stool, GERD  Physical exam: BP (!) 111/52 (BP Location: Right Arm)    Pulse 87    Wt 142 lb (64.4 kg)    BMI 24.00 kg/m  GENERAL APPEARANCE:  Well appearing, well developed, well nourished, NAD HEENT:  Atraumatic, normocephalic, oropharynx clear NECK:  Supple without lymphadenopathy or thyromegaly EXTREMITIES:  Moves all extremities well, without clubbing, cyanosis, or edema NEUROLOGIC:  Alert and oriented x 3, normal gait, CN II-XII grossly intact MENTAL STATUS:  appropriate BACK:  Non-tender to palpation, No CVAT SKIN:  Warm, dry, and intact   Results: U/A: 11-30 WBC, 3-10 RBC, many bacteria, nitrite +   PVR: 135 ml

## 2021-07-07 LAB — URINE CULTURE

## 2021-07-08 ENCOUNTER — Other Ambulatory Visit: Payer: Self-pay

## 2021-07-08 ENCOUNTER — Other Ambulatory Visit: Payer: Self-pay | Admitting: Nurse Practitioner

## 2021-07-08 ENCOUNTER — Telehealth: Payer: Self-pay

## 2021-07-08 ENCOUNTER — Ambulatory Visit (HOSPITAL_COMMUNITY)
Admission: RE | Admit: 2021-07-08 | Discharge: 2021-07-08 | Disposition: A | Discharge status: Home / Self Care | Payer: Medicare Other | Source: Ambulatory Visit | Attending: Nurse Practitioner | Admitting: Nurse Practitioner

## 2021-07-08 ENCOUNTER — Other Ambulatory Visit: Payer: Self-pay | Admitting: Physician Assistant

## 2021-07-08 DIAGNOSIS — Z139 Encounter for screening, unspecified: Secondary | ICD-10-CM

## 2021-07-08 DIAGNOSIS — Z13820 Encounter for screening for osteoporosis: Secondary | ICD-10-CM | POA: Diagnosis not present

## 2021-07-08 DIAGNOSIS — Z780 Asymptomatic menopausal state: Secondary | ICD-10-CM | POA: Insufficient documentation

## 2021-07-08 DIAGNOSIS — M810 Age-related osteoporosis without current pathological fracture: Secondary | ICD-10-CM

## 2021-07-08 DIAGNOSIS — M85852 Other specified disorders of bone density and structure, left thigh: Secondary | ICD-10-CM | POA: Diagnosis not present

## 2021-07-08 MED ORDER — SULFAMETHOXAZOLE-TRIMETHOPRIM 800-160 MG PO TABS: 1.0000 | ORAL_TABLET | Freq: Two times a day (BID) | ORAL | 0 refills | Status: DC

## 2021-07-08 MED ORDER — ALENDRONATE SODIUM 70 MG PO TABS: 70.0000 mg | ORAL_TABLET | ORAL | 11 refills | Status: DC

## 2021-07-08 MED ORDER — CALCIUM CARB-CHOLECALCIFEROL 600-20 MG-MCG PO TABS: 600.0000 mg | ORAL_TABLET | Freq: Two times a day (BID) | ORAL | 4 refills | Status: DC

## 2021-07-08 NOTE — Telephone Encounter (Signed)
Patient returned call to office.  Notified of medication at pharmacy for UTI. Patient voiced understanding.

## 2021-07-08 NOTE — Progress Notes (Signed)
Culture + E.Coli sensitive to Bactrim which she has tolerated in the past.  Prescription sent to her pharmacy.

## 2021-07-08 NOTE — Telephone Encounter (Signed)
-----   Message from Reynaldo Minium, [place] sent at 07/08/2021 12:13 PM EST ----- Please let pt know her urine culture is positive and she needs antibx. I will send Rx for Bactrim which she has had in the past. ----- Message ----- From: Interface, [facility] Lab Results In Sent: 07/04/2021   4:55 PM EST To: Primus Bravo, MD

## 2021-07-08 NOTE — Progress Notes (Signed)
Pls review results with pt. Pt has osteoporosis.   Take fosamax 70mg  once weekly on empty stomach , take med with a full glass of water, pt should ensure to sit upright for 30 minutes( do not lay down) after taking med   Take Caltrate with D3 one tablet two times daily. Meds sent to the pharmacy. P  Pt should report jaw pain.

## 2021-07-08 NOTE — Telephone Encounter (Signed)
Pt was called, her daughter answered and said she was on her way to our office. She will have her call back when she returns home.

## 2021-07-09 ENCOUNTER — Telehealth: Payer: Medicare Other

## 2021-07-16 ENCOUNTER — Ambulatory Visit (INDEPENDENT_AMBULATORY_CARE_PROVIDER_SITE_OTHER): Payer: Medicare Other | Admitting: *Deleted

## 2021-07-16 DIAGNOSIS — E119 Type 2 diabetes mellitus without complications: Secondary | ICD-10-CM

## 2021-07-16 DIAGNOSIS — I10 Essential (primary) hypertension: Secondary | ICD-10-CM

## 2021-07-16 NOTE — Patient Instructions (Signed)
Visit Information  Thank you for taking time to visit with me today. Please don't hesitate to contact me if I can be of assistance to you before our next scheduled telephone appointment.  Following are the goals we discussed today:  Attend all scheduled provider appointments Attend church or other social activities Perform all self care activities independently  Perform IADL's (shopping, preparing meals, housekeeping, managing finances) independently Call provider office for new concerns or questions  check blood sugar at prescribed times: once daily and twice daily take the blood sugar log to all doctor visits take the blood sugar meter to all doctor visits drink 6 to 8 glasses of water each day fill half of plate with vegetables prepare main meal at home 3 to 5 days each week check blood pressure weekly choose a place to take my blood pressure (home, clinic or office, retail store) keep a blood pressure log take blood pressure log to all doctor appointments keep all doctor appointments report new symptoms to your doctor Follow up with Dr. Felipa Eth on 08/15/21 and primary care provider 08/19/21 Stay in touch with your doctor if you have any further urinary, bladder, constipation and pain issues When you go to bathroom, wipe front to back to prevent infection Ask your family and friends for help as needed  Our next appointment is by telephone on 08/29/21 at 1045 am  Please call the care guide team at [phone] if you need to cancel or reschedule your appointment.   If you are experiencing a Mental Health or [place] or need someone to talk to, please call the Suicide and Crisis Lifeline: 988 call the [place] National Suicide Prevention Lifeline: 346 316 4236 or TTY: 212-776-2540 TTY (541)438-4450) to talk to a trained counselor call 1-800-273-TALK (toll free, 24 hour hotline) go to [facility] [address] [place]  [phone]) call the [place]: [phone] call 911   Patient verbalizes understanding of instructions and care plan provided today and agrees to view in [place]. Active MyChart status confirmed with patient.    Jacqlyn Larsen [facility], BSN RN Case Manager [facility] [phone] Urinary Tract Infection, Adult A urinary tract infection (UTI) is an infection of any part of the urinary tract. The urinary tract includes: The kidneys. The ureters. The bladder. The urethra. These organs make, store, and get rid of pee (urine) in the body. What are the causes? This infection is caused by germs (bacteria) in your genital area. These germs grow and cause swelling (inflammation) of your urinary tract. What increases the risk? The following factors may make you more likely to develop this condition: Using a small, thin tube (catheter) to drain pee. Not being able to control when you pee or poop (incontinence). Being female. If you are female, these things can increase the risk: Using these methods to prevent pregnancy: A medicine that kills sperm (spermicide). A device that blocks sperm (diaphragm). Having low levels of a female hormone (estrogen). Being pregnant. You are more likely to develop this condition if: You have genes that add to your risk. You are sexually active. You take antibiotic medicines. You have trouble peeing because of: A prostate that is bigger than normal, if you are female. A blockage in the part of your body that drains pee from the bladder. A kidney stone. A nerve condition that affects your bladder. Not getting enough to drink. Not peeing often enough. You have other conditions, such as: Diabetes. A weak disease-fighting system (  immune system). Sickle cell disease. Gout. Injury of the spine. What are the signs or symptoms? Symptoms of this condition include: Needing to pee right away. Peeing small amounts often. Pain or  burning when peeing. Blood in the pee. Pee that smells bad or not like normal. Trouble peeing. Pee that is cloudy. Fluid coming from the vagina, if you are female. Pain in the belly or lower back. Other symptoms include: Vomiting. Not feeling hungry. Feeling mixed up (confused). This may be the first symptom in older adults. Being tired and grouchy (irritable). A fever. Watery poop (diarrhea). How is this treated? Taking antibiotic medicine. Taking other medicines. Drinking enough water. In some cases, you may need to see a specialist. Follow these instructions at home: Medicines Take over-the-counter and prescription medicines only as told by your doctor. If you were prescribed an antibiotic medicine, take it as told by your doctor. Do not stop taking it even if you start to feel better. General instructions Make sure you: Pee until your bladder is empty. Do not hold pee for a long time. Empty your bladder after sex. Wipe from front to back after peeing or pooping if you are a female. Use each tissue one time when you wipe. Drink enough fluid to keep your pee pale yellow. Keep all follow-up visits. Contact a doctor if: You do not get better after 1-2 days. Your symptoms go away and then come back. Get help right away if: You have very bad back pain. You have very bad pain in your lower belly. You have a fever. You have chills. You feeling like you will vomit or you vomit. Summary A urinary tract infection (UTI) is an infection of any part of the urinary tract. This condition is caused by germs in your genital area. There are many risk factors for a UTI. Treatment includes antibiotic medicines. Drink enough fluid to keep your pee pale yellow. This information is not intended to replace advice given to you by your health care provider. Make sure you discuss any questions you have with your health care provider. Document Revised: 12/23/2019 Document Reviewed:  12/23/2019 Elsevier Patient Education  [employer].

## 2021-07-16 NOTE — Chronic Care Management (AMB) (Signed)
Chronic Care Management   CCM RN Visit Note  07/16/2021 Name: MARLOWE CINQUEMANI MRN: [id number] DOB: June 09, 1935  Subjective: Gloria Owens is a 86 y.o. year old female who is a primary care patient of Renee Rival, FNP. The care management team was consulted for assistance with disease management and care coordination needs.    Engaged with patient by telephone for follow up visit in response to provider referral for case management and/or care coordination services.   Consent to Services:  The patient was given information about Chronic Care Management services, agreed to services, and gave verbal consent prior to initiation of services.  Please see initial visit note for detailed documentation.   Patient agreed to services and verbal consent obtained.   Assessment: Review of patient past medical history, allergies, medications, health status, including review of consultants reports, laboratory and other test data, was performed as part of comprehensive evaluation and provision of chronic care management services.   SDOH (Social Determinants of Health) assessments and interventions performed:    CCM Care Plan  Allergies  Allergen Reactions   Penicillins     Outpatient Encounter Medications as of 07/16/2021  Medication Sig   alendronate (FOSAMAX) 70 MG tablet Take 1 tablet (70 mg total) by mouth every 7 (seven) days. Take with a full glass of water on an empty stomach.   aspirin EC 81 MG tablet Take 81 mg by mouth daily.   Calcium Carb-Cholecalciferol (CALTRATE 600+D3) 600-20 MG-MCG TABS Take 600 mg by mouth 2 (two) times daily.   cholecalciferol (VITAMIN D3) 25 MCG (1000 UNIT) tablet Take 2,000 Units by mouth daily.   metFORMIN (GLUCOPHAGE) 500 MG tablet Take 1 tablet (500 mg total) by mouth 2 (two) times daily with a meal.   olmesartan (BENICAR) 20 MG tablet Take 20 mg by mouth daily.   simvastatin (ZOCOR) 40 MG tablet Take 40 mg by mouth daily.   feeding supplement, GLUCERNA  SHAKE, (GLUCERNA SHAKE) LIQD Take 237 mLs by mouth 3 (three) times daily between meals. (Patient not taking: Reported on 07/16/2021)   sulfamethoxazole-trimethoprim (BACTRIM DS) 800-160 MG tablet Take 1 tablet by mouth 2 (two) times daily. (Patient not taking: Reported on 07/16/2021)   No facility-administered encounter medications on file as of 07/16/2021.    Patient Active Problem List   Diagnosis Date Noted   SUI (stress urinary incontinence, female) 06/13/2021   Constipation 05/13/2021   Urine output low 05/13/2021   Loss of appetite for more than 2 weeks 05/13/2021   Leg cramping 04/22/2021   Back pain 10/16/2020   Unspecified open wound, right lower leg, subsequent encounter 07/31/2020   Encounter to establish care 07/12/2020   Screening due 07/12/2020   Hypertension, essential 07/12/2020   Diabetes mellitus without complication ([place]) 41/58/3094   Hyperlipidemia 07/12/2020   Vitamin D deficiency 07/12/2020    Conditions to be addressed/monitored:HTN and DMII  Care Plan : RN Care Manager plan of care  Updates made by Kassie Mends, RN since 07/16/2021 12:00 AM     Problem: No plan of care established for management of chronic disease states (DM2, HTN)   Priority: High     Long-Range Goal: Development of plan of care for chronic disease management (DM2, HTN)   Priority: High  Note:    Current Barriers:  Knowledge Deficits related to plan of care for management of HTN and DMII  Knowledge Deficits related to basic understanding of hypertension pathophysiology and self care management-  patient reports she had  wrist cuff that recently broke and she got a new wrist cuff and has had some problems with this and states " this cuff doesn't work and I'm not going to use" , reports not checking blood pressure at home. Knowledge Deficits related to basic Diabetes pathophysiology and self care/management- pt needs reinforcement for management of diabetes- diet, action plan.  Patient  reports she lives alone, has no children and her husband passed away in 06/09/2020, reports she has friends and family that assist her if needed, pt reports she is independent in all aspects of her care, continues to drive, goes to church, mows her yard, cleans her house, etc.  Pt reports she checks CBG 1-2 times daily with ranges 110-126,  Ranges low 100's usually.  Patient reports she completed advanced directives and named her niece as HCPOA (did this with lawyer's office).  Patient reports back in December 2022 she has issues with severe constipation, difficulty urinating and severe pain, (abdomen, pelvic area), followed up with Dr. Felipa Eth on 1/19 and 07/04/21 for urinary issues, had ultrasound and has taken 3 rounds of antibiotics and states " I had e coli in my urine" and "feels much better now, finished antibiotics yesterday"  Pt reports she is drinking adequate fluids, states she lost 13 pounds and has gained 2 pounds back now that she feels better.  RNCM Clinical Goal(s):  Patient will verbalize understanding of plan for management of HTN and DMII as evidenced by pt report, review EHR. attend all scheduled medical appointments: 08/15/21 Dr. Felipa Eth  as evidenced by pt report , review EHR and        through collaboration with RN Care manager, provider, and care team.   Interventions: 1:1 collaboration with primary care provider regarding development and update of comprehensive plan of care as evidenced by provider attestation and co-signature Inter-disciplinary care team collaboration (see longitudinal plan of care) Evaluation of current treatment plan related to  self management and patient's adherence to plan as established by provider  Hypertension Interventions: Last practice recorded BP readings:  BP Readings from Last 3 Encounters:  10/16/20 133/70  07/31/20 116/62  07/26/20 117/67  Most recent eGFR/CrCl:  Lab Results  Component Value Date   EGFR 56 (L) 10/17/2020    No  components found for: CRCL  Evaluation of current treatment plan related to hypertension self management and patient's adherence to plan as established by provider Reviewed medications with patient and discussed importance of compliance Counseled on the importance of exercise goals with target of 150 minutes per week Discussed complications of poorly controlled blood pressure such as heart disease, stroke, circulatory complications, vision complications, kidney impairment, sexual dysfunction Pain assessment completed Encouraged pt to check blood pressure at least 1 x per week Reinforced low sodium diet and food choices  Diabetes Interventions: Assessed patient's understanding of A1c goal: <7% Reviewed medications with patient and discussed importance of medication adherence Review of patient status, including review of consultants reports, relevant laboratory and other test results, and medications completed Reinforced carbohydrate modified diet and food choices Reinforced exercise in the management of controlling blood sugar levels Reviewed upcoming scheduled appointments Dr. Felipa Eth 08/15/21 Encouraged patient to drink adequate fluids Reviewed hygiene with wiping front to back Lab Results  Component Value Date   HGBA1C 6.3 (H) 10/17/2020    Patient Goals/Self-Care Activities: Attend all scheduled provider appointments Attend church or other social activities Perform all self care activities independently  Perform IADL's (shopping, preparing meals, housekeeping, managing finances) independently Call  provider office for new concerns or questions  check blood sugar at prescribed times: once daily and twice daily take the blood sugar log to all doctor visits take the blood sugar meter to all doctor visits drink 6 to 8 glasses of water each day fill half of plate with vegetables prepare main meal at home 3 to 5 days each week check blood pressure weekly choose a place to take my  blood pressure (home, clinic or office, retail store) keep a blood pressure log take blood pressure log to all doctor appointments keep all doctor appointments report new symptoms to your doctor Follow up with Dr. Felipa Eth on 08/15/21 and primary care provider 08/19/21 Stay in touch with your doctor if you have any further urinary, bladder, constipation and pain issues When you go to bathroom, wipe front to back to prevent infection Ask your family and friends for help as needed       Plan:Telephone follow up appointment with care management team member scheduled for:  08/29/21  Jacqlyn Larsen [facility], BSN RN Case Manager [facility] [phone]

## 2021-07-23 DIAGNOSIS — E119 Type 2 diabetes mellitus without complications: Secondary | ICD-10-CM

## 2021-07-23 DIAGNOSIS — I10 Essential (primary) hypertension: Secondary | ICD-10-CM

## 2021-08-15 ENCOUNTER — Ambulatory Visit: Payer: Medicare Other | Admitting: Urology

## 2021-08-15 ENCOUNTER — Encounter: Payer: Self-pay | Admitting: Urology

## 2021-08-15 VITALS — BP 138/74 | HR 87

## 2021-08-15 DIAGNOSIS — Z87440 Personal history of urinary (tract) infections: Secondary | ICD-10-CM | POA: Diagnosis not present

## 2021-08-15 DIAGNOSIS — R339 Retention of urine, unspecified: Secondary | ICD-10-CM

## 2021-08-15 DIAGNOSIS — R8290 Unspecified abnormal findings in urine: Secondary | ICD-10-CM

## 2021-08-15 LAB — URINALYSIS, ROUTINE W REFLEX MICROSCOPIC
Bilirubin, UA: NEGATIVE
Glucose, UA: NEGATIVE
Ketones, UA: NEGATIVE
Nitrite, UA: POSITIVE — AB
Protein,UA: NEGATIVE
Specific Gravity, UA: 1.025 (ref 1.005–1.030)
Urobilinogen, Ur: 0.2 mg/dL (ref 0.2–1.0)
pH, UA: 5.5 (ref 5.0–7.5)

## 2021-08-15 LAB — MICROSCOPIC EXAMINATION
Renal Epithel, UA: NONE SEEN /hpf
WBC, UA: >30 /hpf — AB (ref 0–5)

## 2021-08-15 LAB — BLADDER SCAN AMB NON-IMAGING: Scan Result: 38

## 2021-08-15 MED ORDER — SULFAMETHOXAZOLE-TRIMETHOPRIM 800-160 MG PO TABS: 1.0000 | ORAL_TABLET | Freq: Two times a day (BID) | ORAL | 0 refills | Status: DC

## 2021-08-15 NOTE — Progress Notes (Addendum)
PVR 38 ? ? ?MDX Urine culture Tracking [phone] ?Pick up [place] ?

## 2021-08-15 NOTE — Progress Notes (Signed)
? ?Assessment: ?1. Incomplete emptying of bladder   ?2. History of UTI   ?3. Abnormal urine findings   ? ? ?Plan: ?Resolve Mdx urine culture sent today - will call with results ?Begin Bactrim DS x 7 days.  Rx sent. ?Recommend initiation of daily antibiotic for UTI prevention once culture results available. ?Continue timed and double voiding ?Return to office in 6 weeks. ? ?Chief Complaint:  ?Chief Complaint  ?Patient presents with  ? incomplete bladder emptying  ? ? ?History of Present Illness: ? ?Gloria Owens is a 86 y.o. year old female who is seen for further evaluation of voiding difficulty.  At her initial visit in 1/23, she reported acute onset of inability to void or defecate for approximately 6 days.  This episode was 2-3 weeks prior to her visit.  She did not seek medical attention at that time.  She  resumed voiding and having bowel movements.  She reported some left lower quadrant pain with radiation to the left flank and back area only occurring at night.  This was relieved with voiding after approximately 1 hour.  No dysuria or gross hematuria.  She ported nocturia 3-4 times, stress incontinence and some intermittent stream.  She was using 2 incontinence pads per day.  She has a prior history of UTIs but no recent symptoms. ?I&O cath demonstrated 450 mL.  Pelvic exam did not demonstrate significant prolapse. ?Urine culture grew >100 K E. coli.  She was treated with Bactrim. ? ?She was feeling much better at her visit in 2/23  Her urinary symptoms have improved.  She felt like she was emptying her bladder more completely.  Her stress incontinence had decreased.  No dysuria or gross hematuria. ?Urine culture from 07/07/2021 grew >100 K E. coli.  Treated with Bactrim DS x5 days. ?I&O cath:  450 ml ? ?She returns today for follow-up.  She does report some bladder discomfort at night which is relieved with voiding.  No dysuria or gross hematuria.  No incontinence.  She feels like she is emptying her  bladder well. ? ?Portions of the above documentation were copied from a prior visit for review purposes only. ? ? ?Past Medical History:  ?Past Medical History:  ?Diagnosis Date  ? Hypercholesteremia   ? Seizures ([place])   ? with eye surgery several years ago-unknown etiology-no meds  ? ? ?Past Surgical History:  ?Past Surgical History:  ?Procedure Laterality Date  ? ABDOMINAL HYSTERECTOMY    ? total hysterectomy  ? BACK SURGERY    ? fusion of back x2 and 1 other back surgery  ? CHOLECYSTECTOMY    ? COLON RESECTION    ? from endometriosis  ? CYSTOSCOPY    ? fulgeration of tumors  ? HERNIA REPAIR Right   ? INGUINAL HERNIA REPAIR Left 06/20/2013  ? Procedure: HERNIA REPAIR INGUINAL ADULT;  Surgeon: Jamesetta So, MD;  Location: [facility];  Service: General;  Laterality: Left;  ? INSERTION OF MESH Left 06/20/2013  ? Procedure: INSERTION OF MESH;  Surgeon: Jamesetta So, MD;  Location: [facility];  Service: General;  Laterality: Left;  ? ? ?Allergies:  ?Allergies  ?Allergen Reactions  ? Penicillins   ? ? ?Family History:  ?No family history on file. ? ?Social History:  ?Social History  ? ?Tobacco Use  ? Smoking status: Never  ? Smokeless tobacco: Never  ?Vaping Use  ? Vaping Use: Never used  ?Substance Use Topics  ? Alcohol use: No  ? Drug use:  No  ? ? ?ROS: ?Constitutional:  Negative for fever, chills, weight loss ?CV: Negative for chest pain, previous MI, hypertension ?Respiratory:  Negative for shortness of breath, wheezing, sleep apnea, frequent cough ?GI:  Negative for nausea, vomiting, bloody stool, GERD ? ?Physical exam: ?BP 138/74   Pulse 87  ?GENERAL APPEARANCE:  Well appearing, well developed, well nourished, NAD ?HEENT:  Atraumatic, normocephalic, oropharynx clear ?NECK:  Supple without lymphadenopathy or thyromegaly ?ABDOMEN:  Soft, non-tender, no masses ?EXTREMITIES:  Moves all extremities well, without clubbing, cyanosis, or edema ?NEUROLOGIC:  Alert and oriented x 3, normal gait, CN II-XII grossly intact ?MENTAL  STATUS:  appropriate ?BACK:  Non-tender to palpation, No CVAT ?SKIN:  Warm, dry, and intact ? ? ?Results: ?U/A: >30 WBC, 0-2 RBC, many bacteria, nitrite +  ? ?PVR = 38 ml ?

## 2021-08-19 ENCOUNTER — Ambulatory Visit: Payer: Medicare Other | Admitting: Nurse Practitioner

## 2021-08-19 ENCOUNTER — Other Ambulatory Visit: Payer: Self-pay | Admitting: Urology

## 2021-08-20 ENCOUNTER — Ambulatory Visit: Payer: Medicare Other | Admitting: Nurse Practitioner

## 2021-08-21 ENCOUNTER — Other Ambulatory Visit: Payer: Self-pay | Admitting: Urology

## 2021-08-21 DIAGNOSIS — Z87440 Personal history of urinary (tract) infections: Secondary | ICD-10-CM

## 2021-08-21 MED ORDER — TRIMETHOPRIM 100 MG PO TABS: 100.0000 mg | ORAL_TABLET | Freq: Every day | ORAL | 2 refills | Status: DC

## 2021-08-29 ENCOUNTER — Telehealth: Payer: Self-pay | Admitting: *Deleted

## 2021-08-29 ENCOUNTER — Telehealth: Payer: Medicare Other

## 2021-08-29 NOTE — Telephone Encounter (Signed)
?  Care Management  ? ?Follow Up Note ? ? ?08/29/2021 ?Name: Gloria STRENG MRN: [id number] DOB: 1936/05/18 ? ? ?Referred by: Renee Rival, FNP ?Reason for referral : Chronic Care Management (DM2, HTN) ? ? ?An unsuccessful telephone outreach was attempted today. The patient was referred to the case management team for assistance with care management and care coordination.  ? ?Follow Up Plan: Telephone follow up appointment with care management team member scheduled for: upon care guide rescheduling. ? ?Jacqlyn Larsen RNC, BSN ?RN Case Owens ?[facility] ?[phone] ? ? ?

## 2021-08-30 ENCOUNTER — Ambulatory Visit: Payer: Medicare Other | Admitting: Nurse Practitioner

## 2021-09-03 ENCOUNTER — Telehealth: Payer: Self-pay

## 2021-09-03 NOTE — Telephone Encounter (Signed)
Patient called. Left message to return call. ?

## 2021-09-03 NOTE — Telephone Encounter (Signed)
-----   Message from Primus Bravo, MD sent at 08/21/2021  9:00 AM EDT ----- ?Please notify patient to complete Bactrim for UTI treatment.  I would then like for her to being daily TMP for UTI prevention.  Rx sent. ? ? ?----- Message ----- ?From: Dorisann Frames, RN ?Sent: 08/19/2021  11:50 AM EDT ?To: [facility], LPN, Primus Bravo, MD, # ? ?MDX urine culture ? ?

## 2021-09-03 NOTE — Telephone Encounter (Signed)
Patient returned call to office. Notified  of medication at pharmacy for patient to take nightly. Patient voiced understanding.  ?

## 2021-09-16 DIAGNOSIS — I10 Essential (primary) hypertension: Secondary | ICD-10-CM | POA: Diagnosis not present

## 2021-09-16 DIAGNOSIS — E119 Type 2 diabetes mellitus without complications: Secondary | ICD-10-CM | POA: Diagnosis not present

## 2021-09-16 DIAGNOSIS — E559 Vitamin D deficiency, unspecified: Secondary | ICD-10-CM | POA: Diagnosis not present

## 2021-09-16 DIAGNOSIS — E785 Hyperlipidemia, unspecified: Secondary | ICD-10-CM | POA: Diagnosis not present

## 2021-09-17 LAB — CMP14+EGFR
ALT: 18 IU/L (ref 0–32)
AST: 19 IU/L (ref 0–40)
Albumin/Globulin Ratio: 2 (ref 1.2–2.2)
Albumin: 4.3 g/dL (ref 3.6–4.6)
Alkaline Phosphatase: 64 IU/L (ref 44–121)
BUN/Creatinine Ratio: 20 (ref 12–28)
BUN: 19 mg/dL (ref 8–27)
Bilirubin Total: 0.4 mg/dL (ref 0.0–1.2)
CO2: 24 mmol/L (ref 20–29)
Calcium: 9.6 mg/dL (ref 8.7–10.3)
Chloride: 98 mmol/L (ref 96–106)
Creatinine, Ser: 0.95 mg/dL (ref 0.57–1.00)
Globulin, Total: 2.2 g/dL (ref 1.5–4.5)
Glucose: 141 mg/dL — ABNORMAL HIGH (ref 70–99)
Potassium: 4.9 mmol/L (ref 3.5–5.2)
Sodium: 135 mmol/L (ref 134–144)
Total Protein: 6.5 g/dL (ref 6.0–8.5)
eGFR: 59 mL/min/{1.73_m2} — ABNORMAL LOW (ref >59)

## 2021-09-17 LAB — LIPID PANEL
Chol/HDL Ratio: 2.2 ratio (ref 0.0–4.4)
Cholesterol, Total: 143 mg/dL (ref 100–199)
HDL: 66 mg/dL (ref >39)
LDL Chol Calc (NIH): 57 mg/dL (ref 0–99)
Triglycerides: 115 mg/dL (ref 0–149)
VLDL Cholesterol Cal: 20 mg/dL (ref 5–40)

## 2021-09-17 LAB — VITAMIN D 25 HYDROXY (VIT D DEFICIENCY, FRACTURES): Vit D, 25-Hydroxy: 55.8 ng/mL (ref 30.0–100.0)

## 2021-09-17 LAB — HEMOGLOBIN A1C
Est. average glucose Bld gHb Est-mCnc: 140 mg/dL
Hgb A1c MFr Bld: 6.5 % — ABNORMAL HIGH (ref 4.8–5.6)

## 2021-09-19 ENCOUNTER — Ambulatory Visit (INDEPENDENT_AMBULATORY_CARE_PROVIDER_SITE_OTHER): Payer: Medicare Other | Admitting: Nurse Practitioner

## 2021-09-19 ENCOUNTER — Encounter: Payer: Self-pay | Admitting: Nurse Practitioner

## 2021-09-19 VITALS — BP 138/65 | HR 83 | Ht 64.0 in | Wt 149.0 lb

## 2021-09-19 DIAGNOSIS — E118 Type 2 diabetes mellitus with unspecified complications: Secondary | ICD-10-CM

## 2021-09-19 DIAGNOSIS — I10 Essential (primary) hypertension: Secondary | ICD-10-CM

## 2021-09-19 DIAGNOSIS — E559 Vitamin D deficiency, unspecified: Secondary | ICD-10-CM

## 2021-09-19 DIAGNOSIS — E119 Type 2 diabetes mellitus without complications: Secondary | ICD-10-CM | POA: Diagnosis not present

## 2021-09-19 DIAGNOSIS — E1165 Type 2 diabetes mellitus with hyperglycemia: Secondary | ICD-10-CM | POA: Diagnosis not present

## 2021-09-19 DIAGNOSIS — E785 Hyperlipidemia, unspecified: Secondary | ICD-10-CM | POA: Diagnosis not present

## 2021-09-19 DIAGNOSIS — Z139 Encounter for screening, unspecified: Secondary | ICD-10-CM

## 2021-09-19 MED ORDER — METFORMIN HCL 500 MG PO TABS: 500.0000 mg | ORAL_TABLET | Freq: Two times a day (BID) | ORAL | 3 refills | Status: DC

## 2021-09-19 MED ORDER — SIMVASTATIN 40 MG PO TABS: 40.0000 mg | ORAL_TABLET | Freq: Every day | ORAL | 1 refills | Status: DC

## 2021-09-19 MED ORDER — OLMESARTAN MEDOXOMIL 20 MG PO TABS: 20.0000 mg | ORAL_TABLET | Freq: Every day | ORAL | 1 refills | Status: DC

## 2021-09-19 NOTE — Assessment & Plan Note (Signed)
On vitamin D 2000 units daily ?Check labs at next visit ?

## 2021-09-19 NOTE — Patient Instructions (Addendum)
Pleas get your shingles vaccine, TDAP vaccine and pneumonia vaccine at your pharmacy. ?Pleas get your fasting labs done 3-5 days before your next visit ? ? ? ? ?It is important that you exercise regularly at least 30 minutes 5 times a week.  ?Think about what you will eat, plan ahead. ?Choose " clean, green, fresh or frozen" over canned, processed or packaged foods which are more sugary, salty and fatty. ?70 to 75% of food eaten should be vegetables and fruit. ?Three meals at set times with snacks allowed between meals, but they must be fruit or vegetables. ?Aim to eat over a 12 hour period , example 7 am to 7 pm, and STOP after  your last meal of the day. ?Drink water,generally about 64 ounces per day, no other drink is as healthy. Fruit juice is best enjoyed in a healthy way, by EATING the fruit. ? ?Thanks for choosing [facility], we consider it a privelige to serve you. ? ?

## 2021-09-19 NOTE — Assessment & Plan Note (Signed)
Lab Results  ?Component Value Date  ? CHOL 143 09/16/2021  ? HDL 66 09/16/2021  ? [place] 57 09/16/2021  ? TRIG 115 09/16/2021  ? CHOLHDL 2.2 09/16/2021  ?Continue simvastatin 40 mg daily, condition well-controlled ?

## 2021-09-19 NOTE — Assessment & Plan Note (Signed)
Lab Results  ?Component Value Date  ? HGBA1C 6.5 (H) 09/16/2021  ?Chronic condition well-controlled on metformin 500 mg twice daily denies hypoglycemic episodes ?Continue current medication ?On olmesartan 20 mg daily, simvastatin 40 mg daily ?Patient encouraged to get her Tdap vaccine ? ?

## 2021-09-19 NOTE — Assessment & Plan Note (Signed)
BP Readings from Last 3 Encounters:  ?09/19/21 138/65  ?08/15/21 138/74  ?07/04/21 (!) 111/52  ?Chronic condition well-controlled on olmesartan 20 mg daily, blood pressure slightly elevated but due to patient's age will not make changes to her medication at this time. ?Continue olmesartan 20 mg daily ?DASH diet advised, engage in daily exercises at least 150 minutes weekly ?Lab Results  ?Component Value Date  ? NA 135 09/16/2021  ? K 4.9 09/16/2021  ? CO2 24 09/16/2021  ? GLUCOSE 141 (H) 09/16/2021  ? BUN 19 09/16/2021  ? CREATININE 0.95 09/16/2021  ? CALCIUM 9.6 09/16/2021  ? EGFR 59 (L) 09/16/2021  ? GFRNONAA 80 07/12/2020  ? ?

## 2021-09-19 NOTE — Progress Notes (Signed)
? ?  Gloria Owens     MRN: [id number]      DOB: [birth year] ? ? ?HPI ?Gloria Owens with past medical history of hypertension, diabetes mellitus, hyperlipidemia, vitamin D deficiency is here for follow up and re-evaluation of chronic medical conditions, medication management and review of any available recent lab.  ? ?Patient is due for her shingles vaccine Tdap vaccine and pneumonia vaccines need for all 3 vaccines discussed with patient patient encouraged to get her vaccines at her pharmacy she verbalized understanding.  ? ?There are no new concerns.  ?There are no specific complaints  ? ? ? ?ROS ?Denies recent fever or chills. ?Denies sinus pressure, nasal congestion, ear pain or sore throat. ?Denies chest congestion, productive cough or wheezing. ?Denies chest pains, palpitations and leg swelling ?Denies abdominal pain, nausea, vomiting,diarrhea or constipation.   ?Denies dysuria, frequency, hesitancy or incontinence. ?Denies joint pain, swelling and limitation in mobility. ?Denies headaches, seizures, numbness, or tingling. ?Denies depression, anxiety or insomnia. ? ? ? ?PE ? ?BP 138/65 (BP Location: Right Arm, Patient Position: Sitting, Cuff Size: Large)   Pulse 83   Ht $R'5\' 4"'MY$  (1.626 m)   Wt 149 lb (67.6 kg)   SpO2 98%   BMI 25.58 kg/m?  ? ?Patient alert and oriented and in no cardiopulmonary distress. ? ?Chest: Clear to auscultation bilaterally. ? ?CVS: S1, S2 no murmurs, no S3.Regular rate. ? ?ABD: Soft non tender.  ? ?Ext: No edema ? ?MS: Adequate ROM spine, shoulders, hips and knees. ? ?Psych: Good eye contact, normal affect. Memory intact not anxious or depressed appearing. ? ? ? ?Assessment & Plan ? ?Controlled diabetes mellitus type 2 with complications ([place]) ?Lab Results  ?Component Value Date  ? HGBA1C 6.5 (H) 09/16/2021  ?Chronic condition well-controlled on metformin 500 mg twice daily denies hypoglycemic episodes ?Continue current medication ?On olmesartan 20 mg daily, simvastatin 40 mg  daily ?Patient encouraged to get her Tdap vaccine ? ? ?Hypertension, essential ?BP Readings from Last 3 Encounters:  ?09/19/21 138/65  ?08/15/21 138/74  ?07/04/21 (!) 111/52  ?Chronic condition well-controlled on olmesartan 20 mg daily, blood pressure slightly elevated but due to patient's age will not make changes to her medication at this time. ?Continue olmesartan 20 mg daily ?DASH diet advised, engage in daily exercises at least 150 minutes weekly ?Lab Results  ?Component Value Date  ? NA 135 09/16/2021  ? K 4.9 09/16/2021  ? CO2 24 09/16/2021  ? GLUCOSE 141 (H) 09/16/2021  ? BUN 19 09/16/2021  ? CREATININE 0.95 09/16/2021  ? CALCIUM 9.6 09/16/2021  ? EGFR 59 (L) 09/16/2021  ? GFRNONAA 80 07/12/2020  ? ? ?Hyperlipidemia ?Lab Results  ?Component Value Date  ? CHOL 143 09/16/2021  ? HDL 66 09/16/2021  ? [place] 57 09/16/2021  ? TRIG 115 09/16/2021  ? CHOLHDL 2.2 09/16/2021  ?Continue simvastatin 40 mg daily, condition well-controlled ? ?Vitamin D deficiency ?On vitamin D 2000 units daily ?Check labs at next visit  ?

## 2021-09-20 ENCOUNTER — Telehealth: Payer: Self-pay | Admitting: *Deleted

## 2021-09-20 NOTE — Chronic Care Management (AMB) (Signed)
?  Care Management  ? ?Note ? ?09/20/2021 ?Name: [name] MRN: [id number] DOB: [birth year] ? ?Gloria Owens is a 86 y.o. year old female who is a primary care patient of Renee Rival, FNP and is actively engaged with the care management team. I reached out to Jannifer Rodney by phone today to assist with re-scheduling a follow up visit with the RN Case Manager ? ?Follow up plan: ?Unsuccessful telephone outreach attempt made. A HIPAA compliant phone message was left for the patient providing contact information and requesting a return call.  ?The care management team will reach out to the patient again over the next 7 days.  ?If patient returns call to provider office, please advise to call [place] at [phone]. ? ?Laverda Sorenson  ?Care Guide, Embedded Care Coordination ?[place]  Care Management  ?Direct Dial: [phone] ? ?

## 2021-09-20 NOTE — Chronic Care Management (AMB) (Signed)
?  Care Management  ? ?Note ? ?09/20/2021 ?Name: Gloria Owens MRN: [id number] DOB: Oct 18, 1935 ? ?Gloria Owens is a 86 y.o. year old female who is a primary care patient of Renee Rival, FNP and is actively engaged with the care management team. I reached out to Jannifer Rodney by phone today to assist with re-scheduling a follow up visit with the RN Case Manager ? ?Follow up plan: ?Telephone appointment with care management team member scheduled for:10/15/21 ? ?Laverda Sorenson  ?Care Guide, Embedded Care Coordination ?[place]  Care Management  ?Direct Dial: [phone] ? ?

## 2021-10-02 ENCOUNTER — Ambulatory Visit: Payer: Medicare Other | Admitting: Urology

## 2021-10-02 ENCOUNTER — Encounter: Payer: Self-pay | Admitting: Urology

## 2021-10-02 VITALS — BP 135/69 | HR 87

## 2021-10-02 DIAGNOSIS — R339 Retention of urine, unspecified: Secondary | ICD-10-CM | POA: Diagnosis not present

## 2021-10-02 DIAGNOSIS — R8290 Unspecified abnormal findings in urine: Secondary | ICD-10-CM | POA: Diagnosis not present

## 2021-10-02 DIAGNOSIS — Z87440 Personal history of urinary (tract) infections: Secondary | ICD-10-CM | POA: Diagnosis not present

## 2021-10-02 LAB — MICROSCOPIC EXAMINATION: RBC, Urine: NONE SEEN /hpf (ref 0–2)

## 2021-10-02 LAB — URINALYSIS, ROUTINE W REFLEX MICROSCOPIC
Bilirubin, UA: NEGATIVE
Glucose, UA: NEGATIVE
Ketones, UA: NEGATIVE
Nitrite, UA: POSITIVE — AB
Protein,UA: NEGATIVE
Specific Gravity, UA: 1.015 (ref 1.005–1.030)
Urobilinogen, Ur: 0.2 mg/dL (ref 0.2–1.0)
pH, UA: 6 (ref 5.0–7.5)

## 2021-10-02 NOTE — Progress Notes (Signed)
? ?Assessment: ?1. Incomplete emptying of bladder   ?2. History of UTI   ?3. Abnormal urine findings   ? ? ?Plan: ?Continue timed and double voiding ?Complete current course of daily trimethoprim. ?Urine culture sent today. ?Return to office in 6 weeks. ? ?Chief Complaint:  ?Chief Complaint  ?Patient presents with  ? Incomplete emptying  ? ? ?History of Present Illness: ? ?Gloria Owens is a 86 y.o. year old female who is seen for further evaluation of voiding difficulty and history of UTI's.   ?At her initial visit in 1/23, she reported acute onset of inability to void or defecate for approximately 6 days.  This episode was 2-3 weeks prior to her visit.  She did not seek medical attention at that time.  She  resumed voiding and having bowel movements.  She reported some left lower quadrant pain with radiation to the left flank and back area only occurring at night.  This was relieved with voiding after approximately 1 hour.  No dysuria or gross hematuria.  She ported nocturia 3-4 times, stress incontinence and some intermittent stream.  She was using 2 incontinence pads per day.  She has a prior history of UTIs but no recent symptoms. ?I&O cath demonstrated 450 mL.  Pelvic exam did not demonstrate significant prolapse. ?Urine culture grew >100 K E. coli.  She was treated with Bactrim. ? ?She was feeling much better at her visit in 2/23  Her urinary symptoms had improved.  She felt like she was emptying her bladder more completely.  Her stress incontinence had decreased.  No dysuria or gross hematuria. ?Urine culture from 07/07/2021 grew >100 K E. coli.  Treated with Bactrim DS x5 days. ?I&O cath:  450 ml ? ?At her visit in 3/23, she reported some bladder discomfort at night, relieved with voiding.  No dysuria or gross hematuria.  No incontinence.  She felt like she was emptying her bladder well. ?PVR = 38 mL ?Resolve MDX urine culture grew >100 K E. coli.  She was treated with Bactrim x7 days and started on daily  trimethoprim. ? ?She returns today for follow-up.  She continues on daily trimethoprim.  She has some discomfort in the bladder area at night which is relieved after voiding.  She is not having any daytime symptoms.  No urinary incontinence.  No dysuria or gross hematuria. ? ?Portions of the above documentation were copied from a prior visit for review purposes only. ? ? ?Past Medical History:  ?Past Medical History:  ?Diagnosis Date  ? Hypercholesteremia   ? Seizures ([place])   ? with eye surgery several years ago-unknown etiology-no meds  ? ? ?Past Surgical History:  ?Past Surgical History:  ?Procedure Laterality Date  ? ABDOMINAL HYSTERECTOMY    ? total hysterectomy  ? BACK SURGERY    ? fusion of back x2 and 1 other back surgery  ? CHOLECYSTECTOMY    ? COLON RESECTION    ? from endometriosis  ? CYSTOSCOPY    ? fulgeration of tumors  ? HERNIA REPAIR Right   ? INGUINAL HERNIA REPAIR Left 06/20/2013  ? Procedure: HERNIA REPAIR INGUINAL ADULT;  Surgeon: Jamesetta So, MD;  Location: [facility];  Service: General;  Laterality: Left;  ? INSERTION OF MESH Left 06/20/2013  ? Procedure: INSERTION OF MESH;  Surgeon: Jamesetta So, MD;  Location: [facility];  Service: General;  Laterality: Left;  ? ? ?Allergies:  ?Allergies  ?Allergen Reactions  ? Penicillins   ? ? ?  Family History:  ?No family history on file. ? ?Social History:  ?Social History  ? ?Tobacco Use  ? Smoking status: Never  ? Smokeless tobacco: Never  ?Vaping Use  ? Vaping Use: Never used  ?Substance Use Topics  ? Alcohol use: No  ? Drug use: No  ? ? ?ROS: ?Constitutional:  Negative for fever, chills, weight loss ?CV: Negative for chest pain, previous MI, hypertension ?Respiratory:  Negative for shortness of breath, wheezing, sleep apnea, frequent cough ?GI:  Negative for nausea, vomiting, bloody stool, GERD ? ?Physical exam: ?BP 135/69   Pulse 87  ?GENERAL APPEARANCE:  Well appearing, well developed, well nourished, NAD ?HEENT:  Atraumatic, normocephalic, oropharynx  clear ?NECK:  Supple without lymphadenopathy or thyromegaly ?ABDOMEN:  Soft, non-tender, no masses ?EXTREMITIES:  Moves all extremities well, without clubbing, cyanosis, or edema ?NEUROLOGIC:  Alert and oriented x 3, normal gait, CN II-XII grossly intact ?MENTAL STATUS:  appropriate ?BACK:  Non-tender to palpation, No CVAT ?SKIN:  Warm, dry, and intact ? ? ?Results: ?U/A: 11-30 WBCs, many bacteria, nitrite positive ?

## 2021-10-07 LAB — URINE CULTURE

## 2021-10-08 MED ORDER — NITROFURANTOIN MONOHYD MACRO 100 MG PO CAPS: 100.0000 mg | ORAL_CAPSULE | Freq: Two times a day (BID) | ORAL | 0 refills | Status: AC

## 2021-10-08 NOTE — Addendum Note (Signed)
Addended by: Primus Bravo on: 10/08/2021 08:53 AM ? ? Modules accepted: Orders ? ?

## 2021-10-08 NOTE — Progress Notes (Signed)
Patient aware.

## 2021-10-15 ENCOUNTER — Telehealth: Payer: Self-pay | Admitting: *Deleted

## 2021-10-15 ENCOUNTER — Encounter: Payer: Self-pay | Admitting: *Deleted

## 2021-10-15 ENCOUNTER — Telehealth: Payer: Medicare Other

## 2021-10-15 NOTE — Telephone Encounter (Signed)
ERROR

## 2021-10-15 NOTE — Telephone Encounter (Signed)
  Care Management   Follow Up Note   10/15/2021 Name: Gloria Owens MRN: [id number] DOB: 11-Mar-1936   Referred by: Renee Rival, FNP Reason for referral : Chronic Care Management (DM2, HTN)   A second unsuccessful telephone outreach was attempted today. The patient was referred to the case management team for assistance with care management and care coordination.   Follow Up Plan: Telephone follow up appointment with care management team member scheduled for: upon care guide rescheduling.  Jacqlyn Larsen [facility], BSN RN Case Manager [facility] [phone]

## 2021-11-18 ENCOUNTER — Other Ambulatory Visit: Payer: Self-pay

## 2021-11-18 ENCOUNTER — Ambulatory Visit (INDEPENDENT_AMBULATORY_CARE_PROVIDER_SITE_OTHER): Payer: Medicare Other

## 2021-11-18 DIAGNOSIS — Z0000 Encounter for general adult medical examination without abnormal findings: Secondary | ICD-10-CM

## 2021-11-18 MED ORDER — SIMVASTATIN 40 MG PO TABS: 40.0000 mg | ORAL_TABLET | Freq: Every day | ORAL | 1 refills | Status: DC

## 2021-11-18 MED ORDER — OLMESARTAN MEDOXOMIL 20 MG PO TABS: 20.0000 mg | ORAL_TABLET | Freq: Every day | ORAL | 1 refills | Status: DC

## 2021-11-20 ENCOUNTER — Ambulatory Visit: Payer: Medicare Other

## 2021-12-03 ENCOUNTER — Telehealth: Payer: Self-pay

## 2021-12-03 NOTE — Telephone Encounter (Signed)
Called and left a message informing patient of her overdue apt and that I would schedule her.

## 2021-12-03 NOTE — Telephone Encounter (Signed)
Patient called to check if she needed to come back for a visit with Dr. Felipa Eth.  Please advise   Call pt back at  [phone] Leave a message if no answer  Thanks, Helene Kelp

## 2021-12-05 NOTE — Telephone Encounter (Signed)
Spoke with patient and confirmed appt.

## 2021-12-06 ENCOUNTER — Encounter: Payer: Self-pay | Admitting: Urology

## 2021-12-06 ENCOUNTER — Ambulatory Visit: Payer: Medicare Other | Admitting: Urology

## 2021-12-06 VITALS — BP 124/64 | HR 81 | Ht 64.5 in | Wt 143.8 lb

## 2021-12-06 DIAGNOSIS — R339 Retention of urine, unspecified: Secondary | ICD-10-CM

## 2021-12-06 DIAGNOSIS — Z87440 Personal history of urinary (tract) infections: Secondary | ICD-10-CM | POA: Diagnosis not present

## 2021-12-06 LAB — URINALYSIS, ROUTINE W REFLEX MICROSCOPIC
Bilirubin, UA: NEGATIVE
Glucose, UA: NEGATIVE
Ketones, UA: NEGATIVE
Nitrite, UA: NEGATIVE
Protein,UA: NEGATIVE
Specific Gravity, UA: 1.02 (ref 1.005–1.030)
Urobilinogen, Ur: 0.2 mg/dL (ref 0.2–1.0)
pH, UA: 5 (ref 5.0–7.5)

## 2021-12-06 LAB — MICROSCOPIC EXAMINATION: Renal Epithel, UA: NONE SEEN /hpf

## 2021-12-06 NOTE — Progress Notes (Signed)
Assessment: 1. History of UTI   2. Incomplete emptying of bladder     Plan: Continue timed and double voiding Return to office in 3 months  Chief Complaint:  Chief Complaint  Patient presents with   Recurrent UTI    History of Present Illness:  Gloria Owens is a 86 y.o. year old female who is seen for further evaluation of voiding difficulty and history of UTI's.   At her initial visit in 1/23, she reported acute onset of inability to void or defecate for approximately 6 days.  This episode was 2-3 weeks prior to her visit.  She did not seek medical attention at that time.  She  resumed voiding and having bowel movements.  She reported some left lower quadrant pain with radiation to the left flank and back area only occurring at night.  This was relieved with voiding after approximately 1 hour.  No dysuria or gross hematuria.  She ported nocturia 3-4 times, stress incontinence and some intermittent stream.  She was using 2 incontinence pads per day.  She has a prior history of UTIs but no recent symptoms. I&O cath demonstrated 450 mL.  Pelvic exam did not demonstrate significant prolapse. Urine culture grew >100 K E. coli.  She was treated with Bactrim.  She was feeling much better at her visit in 2/23  Her urinary symptoms had improved.  She felt like she was emptying her bladder more completely.  Her stress incontinence had decreased.  No dysuria or gross hematuria. Urine culture from 07/07/2021 grew >100 K E. coli.  Treated with Bactrim DS x5 days. I&O cath:  450 ml  At her visit in 3/23, she reported some bladder discomfort at night, relieved with voiding.  No dysuria or gross hematuria.  No incontinence.  She felt like she was emptying her bladder well. PVR = 38 mL Resolve MDX urine culture grew >100 K E. coli.  She was treated with Bactrim x7 days and started on daily trimethoprim.  At her visit in May 2023, she continued on daily trimethoprim.  She was having some  discomfort in the bladder area at night which was relieved after voiding.  No daytime symptoms.  No urinary incontinence.  No dysuria or gross hematuria. Urine culture from 5/23 grew >100 K E. coli.  She was treated with Macrobid.  She returns today for follow-up.  She is no longer on an antibiotic.  She is not having any dysuria or gross hematuria.  She feels like she empties her bladder well.  No incontinence.  Portions of the above documentation were copied from a prior visit for review purposes only.   Past Medical History:  Past Medical History:  Diagnosis Date   Hypercholesteremia    Seizures ([place])    with eye surgery several years ago-unknown etiology-no meds    Past Surgical History:  Past Surgical History:  Procedure Laterality Date   ABDOMINAL HYSTERECTOMY     total hysterectomy   BACK SURGERY     fusion of back x2 and 1 other back surgery   CHOLECYSTECTOMY     COLON RESECTION     from endometriosis   CYSTOSCOPY     fulgeration of tumors   HERNIA REPAIR Right    INGUINAL HERNIA REPAIR Left 06/20/2013   Procedure: HERNIA REPAIR INGUINAL ADULT;  Surgeon: Jamesetta So, MD;  Location: [facility];  Service: General;  Laterality: Left;   INSERTION OF MESH Left 06/20/2013   Procedure: INSERTION OF MESH;  Surgeon: Jamesetta So, MD;  Location: [facility];  Service: General;  Laterality: Left;    Allergies:  Allergies  Allergen Reactions   Penicillins     Family History:  No family history on file.  Social History:  Social History   Tobacco Use   Smoking status: Never   Smokeless tobacco: Never  Vaping Use   Vaping Use: Never used  Substance Use Topics   Alcohol use: No   Drug use: No    ROS: Constitutional:  Negative for fever, chills, weight loss CV: Negative for chest pain, previous MI, hypertension Respiratory:  Negative for shortness of breath, wheezing, sleep apnea, frequent cough GI:  Negative for nausea, vomiting, bloody stool, GERD  Physical exam: BP  124/64   Pulse 81   Ht 5' 4.5" (1.638 m)   Wt 143 lb 12.8 oz (65.2 kg)   BMI 24.30 kg/m  GENERAL APPEARANCE:  Well appearing, well developed, well nourished, NAD HEENT:  Atraumatic, normocephalic, oropharynx clear NECK:  Supple without lymphadenopathy or thyromegaly ABDOMEN:  Soft, non-tender, no masses EXTREMITIES:  Moves all extremities well, without clubbing, cyanosis, or edema NEUROLOGIC:  Alert and oriented x 3, normal gait, CN II-XII grossly intact MENTAL STATUS:  appropriate BACK:  Non-tender to palpation, No CVAT SKIN:  Warm, dry, and intact   Results: U/A: 6-10 WBC, 3-10 RBC, few bacteria

## 2021-12-09 ENCOUNTER — Telehealth: Payer: Self-pay | Admitting: *Deleted

## 2021-12-09 NOTE — Chronic Care Management (AMB) (Signed)
  Chronic Care Management Note  12/09/2021 Name: Gloria Owens MRN: [id number] DOB: 1936-01-13  Gloria Owens is a 86 y.o. year old female who is a primary care patient of Renee Rival, FNP and is actively engaged with the care management team. I reached out to Jannifer Rodney by phone today to assist with re-scheduling a follow up visit with the RN Case Manager  Follow up plan: Unsuccessful telephone outreach attempt made. A HIPAA compliant phone message was left for the patient providing contact information and requesting a return call.  The care management team will reach out to the patient again over the next 3-5 days.  If patient returns call to provider office, please advise to call [place] at [phone].  [place]  Direct Dial: [phone]

## 2021-12-10 NOTE — Chronic Care Management (AMB) (Signed)
  Chronic Care Management Note  12/10/2021 Name: Gloria Owens MRN: [id number] DOB: 1935/08/04  [name] is a 86 y.o. year old female who is a primary care patient of Renee Rival, FNP and is actively engaged with the care management team. I reached out to Jannifer Rodney by phone today to assist with re-scheduling a follow up visit with the RN Case Manager  Follow up plan: Unsuccessful telephone outreach attempt made. A HIPAA compliant phone message was left for the patient providing contact information and requesting a return call.  The care management team will reach out to the patient again over the next 7 days.  If patient returns call to provider office, please advise to call [place] at [phone].  [place]  Direct Dial: [phone]

## 2021-12-12 ENCOUNTER — Telehealth: Payer: Medicare Other

## 2021-12-16 NOTE — Chronic Care Management (AMB) (Signed)
  Chronic Care Management Note  12/16/2021 Name: VARNIKA BUTZ MRN: [id number] DOB: 02/02/1936  Ladell Pier Latella is a 86 y.o. year old female who is a primary care patient of Renee Rival, FNP and is actively engaged with the care management team. I reached out to Jannifer Rodney by phone today to assist with re-scheduling a follow up visit with the RN Case Manager  Follow up plan: Unsuccessful telephone outreach attempt made. A HIPAA compliant phone message was left for the patient providing contact information and requesting a return call. Unable to make contact on outreach attempts x 3. PCP Renee Rival, FNP notified via routed documentation in medical record. We have been unable to make contact with the patient for follow up. The care management team is available to follow up with the patient after provider conversation with the patient regarding recommendation for care management engagement and subsequent re-referral to the care management team.   [place]  Direct Dial: [phone]

## 2021-12-18 ENCOUNTER — Ambulatory Visit: Payer: Self-pay | Admitting: *Deleted

## 2021-12-18 DIAGNOSIS — E119 Type 2 diabetes mellitus without complications: Secondary | ICD-10-CM

## 2021-12-18 DIAGNOSIS — I10 Essential (primary) hypertension: Secondary | ICD-10-CM

## 2021-12-18 NOTE — Chronic Care Management (AMB) (Signed)
   12/18/2021  Gloria Owens 27-Feb-1936 [id number]   Message received on 12/16/21 from care guide unable to successfully outreach x 3, unable to maintain contact. Case closed.  Jacqlyn Larsen [facility], BSN RN Case Manager [facility] [phone]

## 2021-12-20 ENCOUNTER — Other Ambulatory Visit: Payer: Self-pay | Admitting: Nurse Practitioner

## 2021-12-20 DIAGNOSIS — E1165 Type 2 diabetes mellitus with hyperglycemia: Secondary | ICD-10-CM

## 2022-03-06 ENCOUNTER — Encounter: Payer: Self-pay | Admitting: Urology

## 2022-03-06 ENCOUNTER — Ambulatory Visit (INDEPENDENT_AMBULATORY_CARE_PROVIDER_SITE_OTHER): Payer: Medicare Other | Admitting: Urology

## 2022-03-06 VITALS — BP 132/73 | HR 85

## 2022-03-06 DIAGNOSIS — Z87440 Personal history of urinary (tract) infections: Secondary | ICD-10-CM | POA: Diagnosis not present

## 2022-03-06 DIAGNOSIS — R339 Retention of urine, unspecified: Secondary | ICD-10-CM | POA: Diagnosis not present

## 2022-03-06 DIAGNOSIS — R8290 Unspecified abnormal findings in urine: Secondary | ICD-10-CM | POA: Diagnosis not present

## 2022-03-06 LAB — BLADDER SCAN AMB NON-IMAGING: Scan Result: 12

## 2022-03-06 LAB — URINALYSIS, ROUTINE W REFLEX MICROSCOPIC
Bilirubin, UA: NEGATIVE
Glucose, UA: NEGATIVE
Ketones, UA: NEGATIVE
Nitrite, UA: POSITIVE — AB
Protein,UA: NEGATIVE
Specific Gravity, UA: 1.015 (ref 1.005–1.030)
Urobilinogen, Ur: 0.2 mg/dL (ref 0.2–1.0)
pH, UA: 5.5 (ref 5.0–7.5)

## 2022-03-06 LAB — MICROSCOPIC EXAMINATION

## 2022-03-06 MED ORDER — NITROFURANTOIN MONOHYD MACRO 100 MG PO CAPS: 100.0000 mg | ORAL_CAPSULE | Freq: Two times a day (BID) | ORAL | 0 refills | Status: AC

## 2022-03-06 NOTE — Addendum Note (Signed)
Addended by: Audie Box on: 03/06/2022 01:52 PM   Modules accepted: Orders

## 2022-03-06 NOTE — Progress Notes (Signed)
post void residual =17m

## 2022-03-06 NOTE — Progress Notes (Signed)
Assessment: 1. Incomplete emptying of bladder   2. History of UTI   3. Abnormal urine findings     Plan: Continue timed and double voiding Urine culture sent today Begin Macrobid 100 mg BID x 7 days.  Rx sent. Return to office in 3 months  Chief Complaint:  Chief Complaint  Patient presents with   incomplete bladder emptying    History of Present Illness:  Gloria Owens is a 86 y.o. year old female who is seen for further evaluation of voiding difficulty and history of UTI's.   At her initial visit in 1/23, she reported acute onset of inability to void or defecate for approximately 6 days.  This episode was 2-3 weeks prior to her visit.  She did not seek medical attention at that time.  She  resumed voiding and having bowel movements.  She reported some left lower quadrant pain with radiation to the left flank and back area only occurring at night.  This was relieved with voiding after approximately 1 hour.  No dysuria or gross hematuria.  She ported nocturia 3-4 times, stress incontinence and some intermittent stream.  She was using 2 incontinence pads per day.  She has a prior history of UTIs but no recent symptoms. I&O cath demonstrated 450 mL.  Pelvic exam did not demonstrate significant prolapse. Urine culture grew >100 K E. coli.  She was treated with Bactrim.  She was feeling much better at her visit in 2/23  Her urinary symptoms had improved.  She felt like she was emptying her bladder more completely.  Her stress incontinence had decreased.  No dysuria or gross hematuria. Urine culture from 07/07/2021 grew >100 K E. coli.  Treated with Bactrim DS x5 days. I&O cath:  450 ml  At her visit in 3/23, she reported some bladder discomfort at night, relieved with voiding.  No dysuria or gross hematuria.  No incontinence.  She felt like she was emptying her bladder well. PVR = 38 mL Resolve MDX urine culture grew >100 K E. coli.  She was treated with Bactrim x7 days and started  on daily trimethoprim.  At her visit in May 2023, she continued on daily trimethoprim.  She was having some discomfort in the bladder area at night which was relieved after voiding.  No daytime symptoms.  No urinary incontinence.  No dysuria or gross hematuria. Urine culture from 5/23 grew >100 K E. coli.  She was treated with Macrobid. At her visit in July 2023, she was doing well off antibiotics.  No dysuria or gross hematuria.  She returns today for follow-up.  She reports abdominal discomfort at night which wakes her up.  She reports that she voids and passes gas and her symptoms improved.  This occurs approximately 10 times per night.  She does not have any abdominal discomfort during the day.  She does have some difficulty with starting her stream.  No dysuria or gross hematuria.  Portions of the above documentation were copied from a prior visit for review purposes only.   Past Medical History:  Past Medical History:  Diagnosis Date   Hypercholesteremia    Seizures ([place])    with eye surgery several years ago-unknown etiology-no meds    Past Surgical History:  Past Surgical History:  Procedure Laterality Date   ABDOMINAL HYSTERECTOMY     total hysterectomy   BACK SURGERY     fusion of back x2 and 1 other back surgery   CHOLECYSTECTOMY  COLON RESECTION     from endometriosis   CYSTOSCOPY     fulgeration of tumors   HERNIA REPAIR Right    INGUINAL HERNIA REPAIR Left 06/20/2013   Procedure: HERNIA REPAIR INGUINAL ADULT;  Surgeon: Jamesetta So, MD;  Location: [facility];  Service: General;  Laterality: Left;   INSERTION OF MESH Left 06/20/2013   Procedure: INSERTION OF MESH;  Surgeon: Jamesetta So, MD;  Location: [facility];  Service: General;  Laterality: Left;    Allergies:  Allergies  Allergen Reactions   Penicillins     Family History:  No family history on file.  Social History:  Social History   Tobacco Use   Smoking status: Never   Smokeless tobacco: Never   Vaping Use   Vaping Use: Never used  Substance Use Topics   Alcohol use: No   Drug use: No    ROS: Constitutional:  Negative for fever, chills, weight loss CV: Negative for chest pain, previous MI, hypertension Respiratory:  Negative for shortness of breath, wheezing, sleep apnea, frequent cough GI:  Negative for nausea, vomiting, bloody stool, GERD  Physical exam: BP 132/73   Pulse 85  GENERAL APPEARANCE:  Well appearing, well developed, well nourished, NAD HEENT:  Atraumatic, normocephalic, oropharynx clear NECK:  Supple without lymphadenopathy or thyromegaly ABDOMEN:  Soft, non-tender, no masses EXTREMITIES:  Moves all extremities well, without clubbing, cyanosis, or edema NEUROLOGIC:  Alert and oriented x 3, normal gait, CN II-XII grossly intact MENTAL STATUS:  appropriate BACK:  Non-tender to palpation, No CVAT SKIN:  Warm, dry, and intact   Results: U/A: 11-30 WBC, 0-2 RBC, many bacteria, nitrite +  PVR = 12 ml

## 2022-03-10 ENCOUNTER — Telehealth: Payer: Self-pay

## 2022-03-10 LAB — URINE CULTURE

## 2022-03-10 NOTE — Telephone Encounter (Signed)
-----   Message from Primus Bravo, MD sent at 03/10/2022  8:51 AM EDT ----- Treated with Macrobid x 7 days which is appropriate. Please notify patient to complete antibiotics as prescribed.

## 2022-03-10 NOTE — Telephone Encounter (Signed)
Patient informed of MD response to urine culture and voiced understanding.

## 2022-03-17 DIAGNOSIS — E785 Hyperlipidemia, unspecified: Secondary | ICD-10-CM | POA: Diagnosis not present

## 2022-03-17 DIAGNOSIS — E119 Type 2 diabetes mellitus without complications: Secondary | ICD-10-CM | POA: Diagnosis not present

## 2022-03-17 DIAGNOSIS — I10 Essential (primary) hypertension: Secondary | ICD-10-CM | POA: Diagnosis not present

## 2022-03-17 DIAGNOSIS — Z139 Encounter for screening, unspecified: Secondary | ICD-10-CM | POA: Diagnosis not present

## 2022-03-18 LAB — CBC WITH DIFFERENTIAL/PLATELET
Basophils Absolute: 0 10*3/uL (ref 0.0–0.2)
Basos: 0 %
EOS (ABSOLUTE): 0.1 10*3/uL (ref 0.0–0.4)
Eos: 1 %
Hematocrit: 31.4 % — ABNORMAL LOW (ref 34.0–46.6)
Hemoglobin: 10 g/dL — ABNORMAL LOW (ref 11.1–15.9)
Immature Grans (Abs): 0 10*3/uL (ref 0.0–0.1)
Immature Granulocytes: 0 %
Lymphocytes Absolute: 1.2 10*3/uL (ref 0.7–3.1)
Lymphs: 21 %
MCH: 26.2 pg — ABNORMAL LOW (ref 26.6–33.0)
MCHC: 31.8 g/dL (ref 31.5–35.7)
MCV: 82 fL (ref 79–97)
Monocytes Absolute: 0.5 10*3/uL (ref 0.1–0.9)
Monocytes: 9 %
Neutrophils Absolute: 3.8 10*3/uL (ref 1.4–7.0)
Neutrophils: 69 %
Platelets: 206 10*3/uL (ref 150–450)
RBC: 3.82 x10E6/uL (ref 3.77–5.28)
RDW: 14.6 % (ref 11.7–15.4)
WBC: 5.6 10*3/uL (ref 3.4–10.8)

## 2022-03-18 LAB — CMP14+EGFR
ALT: 13 IU/L (ref 0–32)
AST: 19 IU/L (ref 0–40)
Albumin/Globulin Ratio: 1.8 (ref 1.2–2.2)
Albumin: 4.4 g/dL (ref 3.7–4.7)
Alkaline Phosphatase: 59 IU/L (ref 44–121)
BUN/Creatinine Ratio: 24 (ref 12–28)
BUN: 21 mg/dL (ref 8–27)
Bilirubin Total: 0.3 mg/dL (ref 0.0–1.2)
CO2: 21 mmol/L (ref 20–29)
Calcium: 9.6 mg/dL (ref 8.7–10.3)
Chloride: 99 mmol/L (ref 96–106)
Creatinine, Ser: 0.87 mg/dL (ref 0.57–1.00)
Globulin, Total: 2.4 g/dL (ref 1.5–4.5)
Glucose: 151 mg/dL — ABNORMAL HIGH (ref 70–99)
Potassium: 5.1 mmol/L (ref 3.5–5.2)
Sodium: 134 mmol/L (ref 134–144)
Total Protein: 6.8 g/dL (ref 6.0–8.5)
eGFR: 66 mL/min/{1.73_m2} (ref >59)

## 2022-03-18 LAB — VITAMIN D 25 HYDROXY (VIT D DEFICIENCY, FRACTURES): Vit D, 25-Hydroxy: 46.3 ng/mL (ref 30.0–100.0)

## 2022-03-18 LAB — HEMOGLOBIN A1C
Est. average glucose Bld gHb Est-mCnc: 154 mg/dL
Hgb A1c MFr Bld: 7 % — ABNORMAL HIGH (ref 4.8–5.6)

## 2022-03-18 LAB — LIPID PANEL
Chol/HDL Ratio: 2.2 ratio (ref 0.0–4.4)
Cholesterol, Total: 154 mg/dL (ref 100–199)
HDL: 69 mg/dL (ref >39)
LDL Chol Calc (NIH): 67 mg/dL (ref 0–99)
Triglycerides: 101 mg/dL (ref 0–149)
VLDL Cholesterol Cal: 18 mg/dL (ref 5–40)

## 2022-03-18 LAB — TSH: TSH: 2.62 u[IU]/mL (ref 0.450–4.500)

## 2022-03-18 NOTE — Progress Notes (Signed)
Thank you :)

## 2022-03-18 NOTE — Progress Notes (Signed)
Please inform the patient that her hemoglobin A1c is 7.0, I recommend that she continues taking metformin 500 mg twice daily.  Her hemoglobin A1c is slightly low, I recommend increasing her intake of iron rich foods.  All other labs are stable

## 2022-03-21 ENCOUNTER — Ambulatory Visit (INDEPENDENT_AMBULATORY_CARE_PROVIDER_SITE_OTHER): Payer: Medicare Other | Admitting: Family Medicine

## 2022-03-21 ENCOUNTER — Encounter: Payer: Self-pay | Admitting: Family Medicine

## 2022-03-21 VITALS — BP 126/64 | HR 88 | Ht 64.0 in | Wt 146.1 lb

## 2022-03-21 DIAGNOSIS — Z0001 Encounter for general adult medical examination with abnormal findings: Secondary | ICD-10-CM | POA: Diagnosis not present

## 2022-03-21 DIAGNOSIS — Z2821 Immunization not carried out because of patient refusal: Secondary | ICD-10-CM | POA: Diagnosis not present

## 2022-03-21 NOTE — Assessment & Plan Note (Addendum)
Physical exam as documented Counseling is done on healthy lifestyle involving commitment to 150 minutes of exercise per week,  Discussed heart-healthy diet  Changes in health habits are decided on by the patient with goals and time frames set for achieving them. Immunization and cancer screening needs are specifically addressed at this visit

## 2022-03-21 NOTE — Progress Notes (Signed)
Complete physical exam  Patient: Gloria Owens   DOB: 12/28/1935   86 y.o. Female  MRN: [id number]  Subjective:    Chief Complaint  Patient presents with   Annual Exam    Cpe today. Want to discuss lab results, for advice on iron rich foods.     Gloria Owens is a 86 y.o. female who presents today for a complete physical exam. She reports consuming a general diet. Home exercise routine includes working in her yard and taking care of her brother. She generally feels well. She reports sleeping well. She does have additional problems to discuss today.     Most recent fall risk assessment:    03/21/2022    2:03 PM  [place] in the past year? 0  Number falls in past yr: 0  Injury with Fall? 0  Risk for fall due to : No Fall Risks  Follow up Falls evaluation completed     Most recent depression screenings:    03/21/2022    2:03 PM 11/18/2021    8:08 AM  PHQ 2/9 Scores  PHQ - 2 Score 0 0    Dental: No current dental problems and Last dental visit: July 2023  Patient Active Problem List   Diagnosis Date Noted   Encounter for general adult medical examination with abnormal findings 03/21/2022   SUI (stress urinary incontinence, female) 06/13/2021   Constipation 05/13/2021   Urine output low 05/13/2021   Loss of appetite for more than 2 weeks 05/13/2021   Leg cramping 04/22/2021   Back pain 10/16/2020   Unspecified open wound, right lower leg, subsequent encounter 07/31/2020   Encounter to establish care 07/12/2020   Screening due 07/12/2020   Hypertension, essential 07/12/2020   Controlled diabetes mellitus type 2 with complications ([place]) 59/29/2446   Hyperlipidemia 07/12/2020   Vitamin D deficiency 07/12/2020   Past Medical History:  Diagnosis Date   Hypercholesteremia    Seizures ([place])    with eye surgery several years ago-unknown etiology-no meds   Past Surgical History:  Procedure Laterality Date   ABDOMINAL HYSTERECTOMY     total hysterectomy    BACK SURGERY     fusion of back x2 and 1 other back surgery   CHOLECYSTECTOMY     COLON RESECTION     from endometriosis   CYSTOSCOPY     fulgeration of tumors   HERNIA REPAIR Right    INGUINAL HERNIA REPAIR Left 06/20/2013   Procedure: HERNIA REPAIR INGUINAL ADULT;  Surgeon: Jamesetta So, MD;  Location: [facility];  Service: General;  Laterality: Left;   INSERTION OF MESH Left 06/20/2013   Procedure: INSERTION OF MESH;  Surgeon: Jamesetta So, MD;  Location: [facility];  Service: General;  Laterality: Left;   Social History   Tobacco Use   Smoking status: Never   Smokeless tobacco: Never  Vaping Use   Vaping Use: Never used  Substance Use Topics   Alcohol use: No   Drug use: No   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  Tobacco Use   Smoking status: Never   Smokeless tobacco: Never  Vaping Use   Vaping Use: Never used  Substance and Sexual Activity   Alcohol use: No   Drug use: No   Sexual activity: Not Currently    Birth control/protection: Surgical  Other Topics Concern   Not on file  Social History Narrative   No children- had endometriosis an dhysterectomy; husband recently deceased   Nephew is Jezabelle Chisolm- security guard at [employer]   Social Determinants of Health   Financial Resource Strain: Low Risk  (11/14/2020)   Overall Financial Resource Strain (CARDIA)    Difficulty of Paying Living Expenses: Not hard at all  Food Insecurity: No Food Insecurity (01/24/2021)   Hunger Vital Sign    Worried About Running Out of Food in the Last Year: Never true    [place] in the Last Year: Never true  Transportation Needs: No Transportation Needs (01/24/2021)   PRAPARE - Hydrologist (Medical): No     Lack of Transportation (Non-Medical): No  Physical Activity: Sufficiently Active (11/14/2020)   Exercise Vital Sign    Days of Exercise per Week: 7 days    Minutes of Exercise per Session: 30 min  Stress: No Stress Concern Present (11/14/2020)   [place]    Feeling of Stress : Not at all  Social Connections: Moderately Isolated (11/14/2020)   Social Connection and Isolation Panel [NHANES]    Frequency of Communication with Friends and Family: More than three times a week    Frequency of Social Gatherings with Friends and Family: More than three times a week    Attends Religious Services: More than 4 times per year    Active Member of [employer] or Organizations: No    Attends Archivist Meetings: Never    Marital Status: Widowed  Intimate Partner Violence: Not At Risk (11/14/2020)   Humiliation, Afraid, Rape, and Kick questionnaire    Fear of Current or Ex-Partner: No    Emotionally Abused: No    Physically Abused: No    Sexually Abused: No   No family status information on file.   History reviewed. No pertinent family history. Allergies  Allergen Reactions   Penicillins       Patient Care Team: Alvira Monday, FNP as PCP - General (Family Medicine)   Outpatient Medications Prior to Visit  Medication Sig   aspirin EC 81 MG tablet Take 81 mg by mouth daily.   Calcium Carb-Cholecalciferol (CALTRATE 600+D3) 600-20 MG-MCG TABS Take 600 mg by mouth 2 (two) times daily.   cholecalciferol (VITAMIN D3) 25 MCG (1000 UNIT) tablet Take 2,000 Units by mouth daily.   feeding supplement, GLUCERNA SHAKE, (GLUCERNA SHAKE) LIQD Take 237 mLs by mouth 3 (three) times daily between meals.   metFORMIN (GLUCOPHAGE) 500 MG tablet TAKE (1) TABLET BY MOUTH TWICE DAILY.   olmesartan (BENICAR) 20 MG tablet Take 1 tablet (20 mg total) by mouth daily.   simvastatin (ZOCOR) 40 MG tablet Take 1 tablet (40 mg total) by mouth daily.    No facility-administered medications prior to visit.    Review of Systems  Constitutional:  Negative for chills and fever.  HENT:  Negative for congestion and sinus pain.   Cardiovascular:  Negative for chest pain and palpitations.  Gastrointestinal:  Negative for blood in stool and constipation.  Genitourinary:  Negative for dysuria and hematuria.  Skin:  Positive for itching.  Neurological:  Negative for dizziness and headaches.  Psychiatric/Behavioral:  Negative for memory loss. The patient does not have insomnia.  Objective:     BP 126/64   Pulse 88   Ht _0  (1.626 m)   Wt 146 lb 1.3 oz (66.3 kg)   SpO2 98%   BMI 25.07 kg/m  BP Readings from Last 3 Encounters:  03/21/22 126/64  03/06/22 132/73  12/06/21 124/64   Wt Readings from Last 3 Encounters:  03/21/22 146 lb 1.3 oz (66.3 kg)  12/06/21 143 lb 12.8 oz (65.2 kg)  09/19/21 149 lb (67.6 kg)    Physical Exam   No results found for any visits on 03/21/22. Last CBC Lab Results  Component Value Date   WBC 5.6 03/17/2022   HGB 10.0 (L) 03/17/2022   HCT 31.4 (L) 03/17/2022   MCV 82 03/17/2022   MCH 26.2 (L) 03/17/2022   RDW 14.6 03/17/2022   PLT 206 21/30/8657   Last metabolic panel Lab Results  Component Value Date   GLUCOSE 151 (H) 03/17/2022   NA 134 03/17/2022   K 5.1 03/17/2022   CL 99 03/17/2022   CO2 21 03/17/2022   BUN 21 03/17/2022   CREATININE 0.87 03/17/2022   EGFR 66 03/17/2022   CALCIUM 9.6 03/17/2022   PROT 6.8 03/17/2022   ALBUMIN 4.4 03/17/2022   LABGLOB 2.4 03/17/2022   AGRATIO 1.8 03/17/2022   BILITOT 0.3 03/17/2022   ALKPHOS 59 03/17/2022   AST 19 03/17/2022   ALT 13 03/17/2022   Last lipids Lab Results  Component Value Date   CHOL 154 03/17/2022   HDL 69 03/17/2022   LDLCALC 67 03/17/2022   TRIG 101 03/17/2022   CHOLHDL 2.2 03/17/2022   Last hemoglobin A1c Lab Results  Component Value Date   HGBA1C 7.0 (H) 03/17/2022   Last thyroid functions Lab  Results  Component Value Date   TSH 2.620 03/17/2022   Last vitamin D Lab Results  Component Value Date   VD25OH 46.3 03/17/2022   Last vitamin B12 and Folate No results found for: "VITAMINB12", "FOLATE"      Assessment & Plan:    Routine Health Maintenance and Physical Exam  Immunization History  Administered Date(s) Administered   Moderna Sars-Covid-2 Vaccination 07/29/2019, 08/30/2019    Health Maintenance  Topic Date Due   TETANUS/TDAP  Never done   Zoster Vaccines- Shingrix (1 of 2) Never done   Diabetic kidney evaluation - Urine ACR  10/17/2021   FOOT EXAM  04/22/2022   OPHTHALMOLOGY EXAM  06/24/2022   HEMOGLOBIN A1C  09/16/2022   Medicare Annual Wellness (AWV)  11/19/2022   Diabetic kidney evaluation - GFR measurement  03/18/2023   DEXA SCAN  Completed   HPV VACCINES  Aged Out   Pneumonia Vaccine 21+ Years old  Discontinued   INFLUENZA VACCINE  Discontinued   COVID-19 Vaccine  Discontinued    Discussed health benefits of physical activity, and encouraged her to engage in regular exercise appropriate for her age and condition.  Problem List Items Addressed This Visit       Other   Encounter for general adult medical examination with abnormal findings - Primary    Physical exam as documented Counseling is done on healthy lifestyle involving commitment to 150 minutes of exercise per week,  Discussed heart-healthy diet  Changes in health habits are decided on by the patient with goals and time frames set for achieving them. Immunization and cancer screening needs are specifically addressed at this visit           Other Visit Diagnoses     Refused influenza  vaccine          Return in about 3 months (around 06/21/2022).     Alvira Monday, FNP

## 2022-03-21 NOTE — Patient Instructions (Addendum)
I appreciate the opportunity to provide care to you today!    Follow up:  3 months    Please continue to a heart-healthy diet and increase your physical activities. Try to exercise for 94mns at least three times a week.      It was a pleasure to see you and I look forward to continuing to work together on your health and well-being. Please do not hesitate to call the office if you need care or have questions about your care.   Have a wonderful day and week. With Gratitude, GAlvira MondayMSN, FNP-BC

## 2022-03-21 NOTE — Progress Notes (Signed)
Gloria Owens

## 2022-04-21 ENCOUNTER — Encounter: Payer: Self-pay | Admitting: Internal Medicine

## 2022-04-21 ENCOUNTER — Ambulatory Visit (INDEPENDENT_AMBULATORY_CARE_PROVIDER_SITE_OTHER): Payer: Medicare Other | Admitting: Internal Medicine

## 2022-04-21 DIAGNOSIS — N3001 Acute cystitis with hematuria: Secondary | ICD-10-CM

## 2022-04-21 DIAGNOSIS — N3090 Cystitis, unspecified without hematuria: Secondary | ICD-10-CM | POA: Diagnosis not present

## 2022-04-21 LAB — POCT URINALYSIS DIP (CLINITEK)
Bilirubin, UA: NEGATIVE
Glucose, UA: NEGATIVE mg/dL
Ketones, POC UA: NEGATIVE mg/dL
Nitrite, UA: POSITIVE — AB
POC PROTEIN,UA: NEGATIVE
Spec Grav, UA: 1.01 (ref 1.010–1.025)
Urobilinogen, UA: 0.2 E.U./dL
pH, UA: 6.5 (ref 5.0–8.0)

## 2022-04-21 MED ORDER — NITROFURANTOIN MONOHYD MACRO 100 MG PO CAPS: 100.0000 mg | ORAL_CAPSULE | Freq: Two times a day (BID) | ORAL | 0 refills | Status: DC

## 2022-04-21 NOTE — Patient Instructions (Signed)
Thank you for trusting me with your care. To recap, today we discussed the following:   Urinary Tract Infection - Start - nitrofurantoin, macrocrystal-monohydrate, (MACROBID) 100 MG capsule; Take 1 capsule (100 mg total) by mouth 2 (two) times daily.  Dispense: 10 capsule; Refill: 0  - We also are sending culture of your urine and I will follow up with results.

## 2022-04-21 NOTE — Progress Notes (Signed)
     CC: Abdominal Pain (Has been having a lot of lower left abdominal pain and a lot of gas for the past 9 days. Will wake her up at night and she tries to go to the bathroom she has a lot of gas. Doesn't sleep good at all )    HPI:[name] is a 86 y.o. female who presents for evaluation of lower abdominal pain. For the details of today's visit, please refer to the assessment and plan.  Patient has a history of UTI's. Following with urology. Last seen in October. Also has a history of endometrious and colon problems. Had part of her colon removed at age 43. This episode of abdominal pain started 9 days ago. Drinking sprite caused more gas and worsened pain. She stopped drinking sprite and started taking mylanta which has helped. She has been having more frequent stools, up to 3 a day. Normally she has a bowel movement every 3 days. She is waking up at night and having to go to the restroom. The pain    Past Medical History:  Diagnosis Date  . Hypercholesteremia   . Seizures ([place])    with eye surgery several years ago-unknown etiology-no meds     Physical Exam: Vitals:   04/21/22 1445  BP: 130/65  Pulse: 90  Resp: 16  SpO2: 96%  Weight: 150 lb 1.9 oz (68.1 kg)  Height: 5' 4.5" (1.638 m)     Physical Exam   Assessment & Plan:   No problem-specific Assessment & Plan notes found for this encounter.    Lorene Dy, MD

## 2022-04-22 DIAGNOSIS — N3090 Cystitis, unspecified without hematuria: Secondary | ICD-10-CM | POA: Insufficient documentation

## 2022-04-22 NOTE — Assessment & Plan Note (Signed)
Patient has a history of UTI's. Approximates 6 UTI this year. Following with urology. Last seen in October. Also has a history of endometrious and colon problems. Had part of her colon removed at age 86. This episode of abdominal pain started 9 days ago. The pain is in left lower quadrant. She is waking up at night and having to go to the restroom. Drinking sprite caused more gas and worsened pain. She stopped drinking sprite and started taking mylanta which has helped. She has been having more frequent stools, up to 3 a day. Normally she has a bowel movement every 3 days.   Assessment/Plan: Recurrent Cystitis. Patients last culture E.Coli. Sensitive to Nitrofurantoin. Diptstick + for nitrites and leukocytes. Will start antibiotic and send urine for culture. She is established with urology and has follow up in January. If patient continues to have repeat UTI, I suggested discussing prophylaxis with urology. - nitrofurantoin, macrocrystal-monohydrate, (MACROBID) 100 MG capsule; Take 1 capsule (100 mg total) by mouth 2 (two) times daily.  Dispense: 10 capsule; Refill: 0 - Urine Culture

## 2022-04-25 ENCOUNTER — Encounter: Payer: Self-pay | Admitting: Internal Medicine

## 2022-04-25 LAB — URINE CULTURE

## 2022-06-02 ENCOUNTER — Other Ambulatory Visit: Payer: Self-pay | Admitting: Nurse Practitioner

## 2022-06-02 DIAGNOSIS — E1165 Type 2 diabetes mellitus with hyperglycemia: Secondary | ICD-10-CM

## 2022-06-18 ENCOUNTER — Ambulatory Visit: Payer: Medicare Other | Admitting: Urology

## 2022-06-18 ENCOUNTER — Encounter: Payer: Self-pay | Admitting: Urology

## 2022-06-18 VITALS — BP 154/62 | HR 92

## 2022-06-18 DIAGNOSIS — R339 Retention of urine, unspecified: Secondary | ICD-10-CM

## 2022-06-18 DIAGNOSIS — Z87440 Personal history of urinary (tract) infections: Secondary | ICD-10-CM | POA: Diagnosis not present

## 2022-06-18 DIAGNOSIS — R351 Nocturia: Secondary | ICD-10-CM

## 2022-06-18 LAB — BLADDER SCAN AMB NON-IMAGING: Scan Result: 24

## 2022-06-18 MED ORDER — MIRABEGRON ER 25 MG PO TB24: 25.0000 mg | ORAL_TABLET | Freq: Every day | ORAL | 0 refills | Status: DC

## 2022-06-18 MED ORDER — NITROFURANTOIN MONOHYD MACRO 100 MG PO CAPS: 100.0000 mg | ORAL_CAPSULE | Freq: Two times a day (BID) | ORAL | 0 refills | Status: DC

## 2022-06-18 MED ORDER — NITROFURANTOIN MACROCRYSTAL 50 MG PO CAPS: 50.0000 mg | ORAL_CAPSULE | Freq: Every day | ORAL | 11 refills | Status: DC

## 2022-06-18 NOTE — Progress Notes (Signed)
post void residual= 24

## 2022-06-18 NOTE — Patient Instructions (Signed)

## 2022-06-18 NOTE — Progress Notes (Signed)
06/18/2022 2:54 PM   Gloria Owens 10-13-1935 [id number]  Referring provider: Renee Rival, [address],  [place] [id number]  Followup frequent UTI and nocturia   HPI: Gloria Owens is a 87yo here for followup for frequent UTI and incomplete emptying. She has nocturia 3-4x with fluid management. The voids at night are low volume and associated with severe urinary urgency. UA today is concerning for infection. She has intermittent dysuria but no gross hematuria.    PMH: Past Medical History:  Diagnosis Date   Hypercholesteremia    Seizures ([place])    with eye surgery several years ago-unknown etiology-no meds    Surgical History: Past Surgical History:  Procedure Laterality Date   ABDOMINAL HYSTERECTOMY     total hysterectomy   BACK SURGERY     fusion of back x2 and 1 other back surgery   CHOLECYSTECTOMY     COLON RESECTION     from endometriosis   CYSTOSCOPY     fulgeration of tumors   HERNIA REPAIR Right    INGUINAL HERNIA REPAIR Left 06/20/2013   Procedure: HERNIA REPAIR INGUINAL ADULT;  Surgeon: Jamesetta So, MD;  Location: [facility];  Service: General;  Laterality: Left;   INSERTION OF MESH Left 06/20/2013   Procedure: INSERTION OF MESH;  Surgeon: Jamesetta So, MD;  Location: [facility];  Service: General;  Laterality: Left;    Home Medications:  Allergies as of 06/18/2022       Reactions   Penicillins         Medication List        Accurate as of June 18, 2022  2:54 PM. If you have any questions, ask your nurse or doctor.          aspirin EC 81 MG tablet Take 81 mg by mouth daily.   Calcium Carb-Cholecalciferol 600-20 MG-MCG Tabs Commonly known as: Caltrate 600+D3 Take 600 mg by mouth 2 (two) times daily.   cholecalciferol 25 MCG (1000 UNIT) tablet Commonly known as: VITAMIN D3 Take 2,000 Units by mouth daily.   feeding supplement (GLUCERNA SHAKE) Liqd Take 237 mLs by mouth 3 (three) times daily between  meals.   metFORMIN 500 MG tablet Commonly known as: GLUCOPHAGE TAKE (1) TABLET BY MOUTH TWICE DAILY.   nitrofurantoin (macrocrystal-monohydrate) 100 MG capsule Commonly known as: Macrobid Take 1 capsule (100 mg total) by mouth 2 (two) times daily.   olmesartan 20 MG tablet Commonly known as: BENICAR Take 1 tablet (20 mg total) by mouth daily.   simvastatin 40 MG tablet Commonly known as: ZOCOR Take 1 tablet (40 mg total) by mouth daily.        Allergies:  Allergies  Allergen Reactions   Penicillins     Family History: No family history on file.  Social History:  reports that she has never smoked. She has never used smokeless tobacco. She reports that she does not drink alcohol and does not use drugs.  ROS: All other review of systems were reviewed and are negative except what is noted above in HPI  Physical Exam: BP (!) 154/62   Pulse 92   Constitutional:  Alert and oriented, No acute distress. HEENT: [place] AT, moist mucus membranes.  Trachea midline, no masses. Cardiovascular: No clubbing, cyanosis, or edema. Respiratory: Normal respiratory effort, no increased work of breathing. GI: Abdomen is soft, nontender, nondistended, no abdominal masses GU: No CVA tenderness.  Lymph: No cervical or inguinal lymphadenopathy. Skin: No rashes,  bruises or suspicious lesions. Neurologic: Grossly intact, no focal deficits, moving all 4 extremities. Psychiatric: Normal mood and affect.  Laboratory Data: Lab Results  Component Value Date   WBC 5.6 03/17/2022   HGB 10.0 (L) 03/17/2022   HCT 31.4 (L) 03/17/2022   MCV 82 03/17/2022   PLT 206 03/17/2022    Lab Results  Component Value Date   CREATININE 0.87 03/17/2022    No results found for: "PSA"  No results found for: "TESTOSTERONE"  Lab Results  Component Value Date   HGBA1C 7.0 (H) 03/17/2022    Urinalysis    Component Value Date/Time   APPEARANCEUR Cloudy (A) 03/06/2022 1353   GLUCOSEU Negative 03/06/2022  1353   BILIRUBINUR negative 04/21/2022 1545   BILIRUBINUR Negative 03/06/2022 1353   KETONESUR negative 04/21/2022 1545   PROTEINUR Negative 03/06/2022 1353   UROBILINOGEN 0.2 04/21/2022 1545   NITRITE Positive (A) 04/21/2022 1545   NITRITE Positive (A) 03/06/2022 1353   LEUKOCYTESUR Small (1+) (A) 04/21/2022 1545   LEUKOCYTESUR 1+ (A) 03/06/2022 1353    Lab Results  Component Value Date   LABMICR See below: 03/06/2022   WBCUA 11-30 (A) 03/06/2022   LABEPIT 0-10 03/06/2022   BACTERIA Many (A) 03/06/2022    Pertinent Imaging:  Results for orders placed during the hospital encounter of 05/07/06  DG Abd 1 View  Narrative Clinical Data:  Postop. Abdominal symptoms. ABDOMEN - 1 VIEW: Findings: Mild gaseous distention of multiple small bowel loops compatible with nonspecific generalized ileus. Fairly generous amount of stool in the colon.  Transpedicle screws and interbody spacers at L4-5. The patient has posterolateral bony fusion as well.  Impression Query mild constipation. Nonspecific ileus.  Provider: Elvera Lennox  No results found for this or any previous visit.  No results found for this or any previous visit.  No results found for this or any previous visit.  No results found for this or any previous visit.  No valid procedures specified. No results found for this or any previous visit.  No results found for this or any previous visit.   Assessment & Plan:    1. History of UTI -urine for culture -macrobid '100mg'$  BID for 7 days -We will start macrodantin  - Urinalysis, Routine w reflex microscopic  2. nocturia -mirabegron '25mg'$     No follow-ups on file.  Nicolette Bang, MD  [facility] [place]

## 2022-06-19 LAB — MICROSCOPIC EXAMINATION

## 2022-06-19 LAB — URINALYSIS, ROUTINE W REFLEX MICROSCOPIC
Bilirubin, UA: NEGATIVE
Glucose, UA: NEGATIVE
Nitrite, UA: NEGATIVE
Protein,UA: NEGATIVE
Specific Gravity, UA: 1.01 (ref 1.005–1.030)
Urobilinogen, Ur: 0.2 mg/dL (ref 0.2–1.0)
pH, UA: 5.5 (ref 5.0–7.5)

## 2022-06-20 LAB — URINE CULTURE

## 2022-06-23 ENCOUNTER — Ambulatory Visit: Payer: Medicare Other | Admitting: Family Medicine

## 2022-06-23 ENCOUNTER — Other Ambulatory Visit: Payer: Self-pay

## 2022-06-27 ENCOUNTER — Ambulatory Visit (INDEPENDENT_AMBULATORY_CARE_PROVIDER_SITE_OTHER): Payer: Medicare Other | Admitting: Family Medicine

## 2022-06-27 ENCOUNTER — Encounter: Payer: Self-pay | Admitting: Family Medicine

## 2022-06-27 VITALS — BP 133/71 | HR 83 | Ht 64.5 in | Wt 149.1 lb

## 2022-06-27 DIAGNOSIS — I10 Essential (primary) hypertension: Secondary | ICD-10-CM | POA: Diagnosis not present

## 2022-06-27 DIAGNOSIS — E038 Other specified hypothyroidism: Secondary | ICD-10-CM

## 2022-06-27 DIAGNOSIS — E7849 Other hyperlipidemia: Secondary | ICD-10-CM

## 2022-06-27 DIAGNOSIS — G479 Sleep disorder, unspecified: Secondary | ICD-10-CM

## 2022-06-27 DIAGNOSIS — E559 Vitamin D deficiency, unspecified: Secondary | ICD-10-CM

## 2022-06-27 DIAGNOSIS — E119 Type 2 diabetes mellitus without complications: Secondary | ICD-10-CM

## 2022-06-27 MED ORDER — HYDROXYZINE PAMOATE 25 MG PO CAPS: 25.0000 mg | ORAL_CAPSULE | Freq: Every evening | ORAL | 0 refills | Status: DC

## 2022-06-27 NOTE — Progress Notes (Unsigned)
Established Patient Office Visit  Subjective:  Patient ID: Gloria Owens, female    DOB: 04/15/1936  Age: 87 y.o. MRN: [id number]  CC:  Chief Complaint  Patient presents with   Follow-up    3 month f/u. Pt reports trouble falling asleep and staying asleep.     HPI Gloria Owens is a 87 y.o. female with past medical history of hypertension, type 2 diabetes, hyperlipidemia presents for f/u of  chronic medical conditions. For the details of today's visit, please refer to the assessment and plan.     Past Medical History:  Diagnosis Date   Hypercholesteremia    Seizures ([place])    with eye surgery several years ago-unknown etiology-no meds    Past Surgical History:  Procedure Laterality Date   ABDOMINAL HYSTERECTOMY     total hysterectomy   BACK SURGERY     fusion of back x2 and 1 other back surgery   CHOLECYSTECTOMY     COLON RESECTION     from endometriosis   CYSTOSCOPY     fulgeration of tumors   HERNIA REPAIR Right    INGUINAL HERNIA REPAIR Left 06/20/2013   Procedure: HERNIA REPAIR INGUINAL ADULT;  Surgeon: Jamesetta So, MD;  Location: [facility];  Service: General;  Laterality: Left;   INSERTION OF MESH Left 06/20/2013   Procedure: INSERTION OF MESH;  Surgeon: Jamesetta So, MD;  Location: [facility];  Service: General;  Laterality: Left;    History reviewed. No pertinent family history.  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  Tobacco Use   Smoking status: Never   Smokeless tobacco: Never  Vaping Use   Vaping Use: Never used  Substance and Sexual Activity   Alcohol use: No   Drug use: No   Sexual activity: Not Currently    Birth control/protection: Surgical  Other Topics Concern   Not on file  Social History  Narrative   No children- had endometriosis an dhysterectomy; husband recently deceased   Nephew is Kiley Solimine- security guard at [employer] Strain: Low Risk  (11/14/2020)   Overall Financial Resource Strain (CARDIA)    Difficulty of Paying Living Expenses: Not hard at all  Food Insecurity: No Food Insecurity (01/24/2021)   Hunger Vital Sign    Worried About Running Out of Food in the Last Year: Never true    [place] in the Last Year: Never true  Transportation Needs: No Transportation Needs (01/24/2021)   PRAPARE - Hydrologist (Medical): No    Lack of Transportation (Non-Medical): No  Physical Activity: Sufficiently Active (11/14/2020)   Exercise Vital Sign    Days of Exercise per Week: 7 days    Minutes of Exercise per Session: 30 min  Stress: No Stress Concern Present (11/14/2020)   [place]    Feeling of Stress : Not at all  Social Connections: Moderately Isolated (11/14/2020)   Social Connection and Isolation Panel [NHANES]    Frequency of Communication with Friends and Family: More than three times a week    Frequency of Social  Gatherings with Friends and Family: More than three times a week    Attends Religious Services: More than 4 times per year    Active Member of Clubs or Organizations: No    Attends Archivist Meetings: Never    Marital Status: Widowed  Intimate Partner Violence: Not At Risk (11/14/2020)   Humiliation, Afraid, Rape, and Kick questionnaire    Fear of Current or Ex-Partner: No    Emotionally Abused: No    Physically Abused: No    Sexually Abused: No    Outpatient Medications Prior to Visit  Medication Sig Dispense Refill   aspirin EC 81 MG tablet Take 81 mg by mouth daily.     Calcium Carb-Cholecalciferol (CALTRATE 600+D3) 600-20 MG-MCG TABS Take 600 mg by mouth 2 (two) times daily. 60 tablet 4    cholecalciferol (VITAMIN D3) 25 MCG (1000 UNIT) tablet Take 2,000 Units by mouth daily.     feeding supplement, GLUCERNA SHAKE, (GLUCERNA SHAKE) LIQD Take 237 mLs by mouth 3 (three) times daily between meals. 30 mL 2   metFORMIN (GLUCOPHAGE) 500 MG tablet TAKE (1) TABLET BY MOUTH TWICE DAILY. 180 tablet 0   mirabegron ER (MYRBETRIQ) 25 MG TB24 tablet Take 1 tablet (25 mg total) by mouth daily. 30 tablet 0   nitrofurantoin (MACRODANTIN) 50 MG capsule Take 1 capsule (50 mg total) by mouth at bedtime. 30 capsule 11   nitrofurantoin, macrocrystal-monohydrate, (MACROBID) 100 MG capsule Take 1 capsule (100 mg total) by mouth 2 (two) times daily. 10 capsule 0   nitrofurantoin, macrocrystal-monohydrate, (MACROBID) 100 MG capsule Take 1 capsule (100 mg total) by mouth every 12 (twelve) hours. 14 capsule 0   olmesartan (BENICAR) 20 MG tablet Take 1 tablet (20 mg total) by mouth daily. 90 tablet 1   simvastatin (ZOCOR) 40 MG tablet Take 1 tablet (40 mg total) by mouth daily. 90 tablet 1   No facility-administered medications prior to visit.    Allergies  Allergen Reactions   Penicillins     ROS Review of Systems  Constitutional:  Negative for chills and fever.  Eyes:  Negative for visual disturbance.  Respiratory:  Negative for chest tightness and shortness of breath.   Neurological:  Negative for dizziness and headaches.  Psychiatric/Behavioral:  Positive for sleep disturbance.       Objective:    Physical Exam HENT:     Head: Normocephalic.     Mouth/Throat:     Mouth: Mucous membranes are moist.  Cardiovascular:     Rate and Rhythm: Normal rate.     Heart sounds: Normal heart sounds.  Pulmonary:     Effort: Pulmonary effort is normal.     Breath sounds: Normal breath sounds.  Neurological:     Mental Status: She is alert.     BP 133/71   Pulse 83   Ht 5' 4.5" (1.638 m)   Wt 149 lb 1.9 oz (67.6 kg)   SpO2 97%   BMI 25.20 kg/m  Wt Readings from Last 3 Encounters:   06/27/22 149 lb 1.9 oz (67.6 kg)  04/21/22 150 lb 1.9 oz (68.1 kg)  03/21/22 146 lb 1.3 oz (66.3 kg)    Lab Results  Component Value Date   TSH 2.620 03/17/2022   Lab Results  Component Value Date   WBC 5.6 03/17/2022   HGB 10.0 (L) 03/17/2022   HCT 31.4 (L) 03/17/2022   MCV 82 03/17/2022   PLT 206 03/17/2022   Lab Results  Component Value Date  NA 134 03/17/2022   K 5.1 03/17/2022   CO2 21 03/17/2022   GLUCOSE 151 (H) 03/17/2022   BUN 21 03/17/2022   CREATININE 0.87 03/17/2022   BILITOT 0.3 03/17/2022   ALKPHOS 59 03/17/2022   AST 19 03/17/2022   ALT 13 03/17/2022   PROT 6.8 03/17/2022   ALBUMIN 4.4 03/17/2022   CALCIUM 9.6 03/17/2022   EGFR 66 03/17/2022   Lab Results  Component Value Date   CHOL 154 03/17/2022   Lab Results  Component Value Date   HDL 69 03/17/2022   Lab Results  Component Value Date   LDLCALC 67 03/17/2022   Lab Results  Component Value Date   TRIG 101 03/17/2022   Lab Results  Component Value Date   CHOLHDL 2.2 03/17/2022   Lab Results  Component Value Date   HGBA1C 7.0 (H) 03/17/2022      Assessment & Plan:  Type 2 diabetes mellitus without complication, without long-term current use of insulin ([place]) Assessment & Plan: She takes metformin 500 mg daily Denies polyuria, polyphagia, polydipsia Will assess hemoglobin A1c today Lab Results  Component Value Date   HGBA1C 7.0 (H) 03/17/2022      Hypertension, essential Assessment & Plan: Controlled She takes olmesartan '20mg'$  daily Reports compliance with treatment regimen She denies headaches, dizziness, blurred vision Encouraged to continue treatment regimen BP Readings from Last 3 Encounters:  06/27/22 133/71  06/18/22 (!) 154/62  04/21/22 130/65      Sleep disturbance Assessment & Plan: Chronic condition Has been using over-the-counter Unisom 25 mg nightly Reports not taking over-the-counter regiment Will start a trial of Vistaril 25 mg  nightly   Orders: -     hydrOXYzine Pamoate; Take 1 capsule (25 mg total) by mouth at bedtime.  Dispense: 30 capsule; Refill: 0  Other hyperlipidemia Assessment & Plan: She takes simvastatin 40 mg daily Denies muscle aches and pain Will assess lipid panel today Lab Results  Component Value Date   CHOL 154 03/17/2022   HDL 69 03/17/2022   LDLCALC 67 03/17/2022   TRIG 101 03/17/2022   CHOLHDL 2.2 03/17/2022      Orders: -     Lipid panel -     CMP14+EGFR -     CBC with Differential/Platelet  Diabetes mellitus without complication (HCC) -     HM Diabetes Foot Exam -     Microalbumin / creatinine urine ratio -     Hemoglobin A1c  Other specified hypothyroidism -     TSH + free T4  Vitamin D deficiency -     VITAMIN D 25 Hydroxy (Vit-D Deficiency, Fractures)    Follow-up: Return in about 4 months (around 10/26/2022).   Alvira Monday, FNP

## 2022-06-27 NOTE — Patient Instructions (Addendum)
I appreciate the opportunity to provide care to you today!    Follow up:  4 months  Labs: please stop by the lab today/ during the week to get your blood drawn (CBC, CMP, TSH, Lipid profile, HgA1c, Vit D)    Please continue to a heart-healthy diet and increase your physical activities. Try to exercise for 30mns at least five times a week.   Physical activity helps: Lower your blood glucose, improve your heart health, lower your blood pressure and cholesterol, burn calories to help manage her weight, gave you energy, lower stress, and improve his sleep.  The American diabetes Association (ADA) recommends being active for 2-1/2 hours (150 minutes) or more week.  Exercise for 30 minutes, 5 days a week (150 minutes total)     It was a pleasure to see you and I look forward to continuing to work together on your health and well-being. Please do not hesitate to call the office if you need care or have questions about your care.   Have a wonderful day and week. With Gratitude, GAlvira MondayMSN, FNP-BC

## 2022-06-28 DIAGNOSIS — G479 Sleep disorder, unspecified: Secondary | ICD-10-CM | POA: Insufficient documentation

## 2022-06-28 NOTE — Assessment & Plan Note (Signed)
She takes simvastatin 40 mg daily Denies muscle aches and pain Will assess lipid panel today Lab Results  Component Value Date   CHOL 154 03/17/2022   HDL 69 03/17/2022   LDLCALC 67 03/17/2022   TRIG 101 03/17/2022   CHOLHDL 2.2 03/17/2022

## 2022-06-28 NOTE — Assessment & Plan Note (Signed)
Controlled She takes olmesartan '20mg'$  daily Reports compliance with treatment regimen She denies headaches, dizziness, blurred vision Encouraged to continue treatment regimen BP Readings from Last 3 Encounters:  06/27/22 133/71  06/18/22 (!) 154/62  04/21/22 130/65

## 2022-06-28 NOTE — Assessment & Plan Note (Signed)
She takes metformin 500 mg daily Denies polyuria, polyphagia, polydipsia Will assess hemoglobin A1c today Lab Results  Component Value Date   HGBA1C 7.0 (H) 03/17/2022

## 2022-06-28 NOTE — Assessment & Plan Note (Signed)
Chronic condition Has been using over-the-counter Unisom 25 mg nightly Reports not taking over-the-counter regiment Will start a trial of Vistaril 25 mg nightly

## 2022-08-01 ENCOUNTER — Ambulatory Visit: Payer: Medicare Other | Admitting: Urology

## 2022-08-01 ENCOUNTER — Encounter: Payer: Self-pay | Admitting: Urology

## 2022-08-01 VITALS — BP 130/66 | HR 79

## 2022-08-01 DIAGNOSIS — R351 Nocturia: Secondary | ICD-10-CM

## 2022-08-01 DIAGNOSIS — Z87440 Personal history of urinary (tract) infections: Secondary | ICD-10-CM | POA: Diagnosis not present

## 2022-08-01 LAB — URINALYSIS, ROUTINE W REFLEX MICROSCOPIC
Bilirubin, UA: NEGATIVE
Glucose, UA: NEGATIVE
Ketones, UA: NEGATIVE
Nitrite, UA: NEGATIVE
Protein,UA: NEGATIVE
Specific Gravity, UA: 1.02 (ref 1.005–1.030)
Urobilinogen, Ur: 0.2 mg/dL (ref 0.2–1.0)
pH, UA: 6 (ref 5.0–7.5)

## 2022-08-01 LAB — MICROSCOPIC EXAMINATION

## 2022-08-01 MED ORDER — NITROFURANTOIN MACROCRYSTAL 50 MG PO CAPS: 50.0000 mg | ORAL_CAPSULE | Freq: Every day | ORAL | 11 refills | Status: DC

## 2022-08-01 NOTE — Progress Notes (Signed)
08/01/2022 12:57 PM   Jannifer Rodney [birth year] [id number]  Referring provider: Alvira Monday, [address] [place],  [place] [address]  Followup OAB and recurrent UTI   HPI: Ms Gloria Owens is a 87yo here for followup for OAB and recurrent UTI. No UTIs since last visit. She took mirabegron which caused her to retain so she stopped the medication. She is off the mirabegron. She is on macrodantin prophylaxis. No significant LUTS.    PMH: Past Medical History:  Diagnosis Date   Hypercholesteremia    Seizures ([place])    with eye surgery several years ago-unknown etiology-no meds    Surgical History: Past Surgical History:  Procedure Laterality Date   ABDOMINAL HYSTERECTOMY     total hysterectomy   BACK SURGERY     fusion of back x2 and 1 other back surgery   CHOLECYSTECTOMY     COLON RESECTION     from endometriosis   CYSTOSCOPY     fulgeration of tumors   HERNIA REPAIR Right    INGUINAL HERNIA REPAIR Left 06/20/2013   Procedure: HERNIA REPAIR INGUINAL ADULT;  Surgeon: Jamesetta So, MD;  Location: [facility];  Service: General;  Laterality: Left;   INSERTION OF MESH Left 06/20/2013   Procedure: INSERTION OF MESH;  Surgeon: Jamesetta So, MD;  Location: [facility];  Service: General;  Laterality: Left;    Home Medications:  Allergies as of 08/01/2022       Reactions   Penicillins         Medication List        Accurate as of August 01, 2022 12:57 PM. If you have any questions, ask your nurse or doctor.          aspirin EC 81 MG tablet Take 81 mg by mouth daily.   Calcium Carb-Cholecalciferol 600-20 MG-MCG Tabs Commonly known as: Caltrate 600+D3 Take 600 mg by mouth 2 (two) times daily.   cholecalciferol 25 MCG (1000 UNIT) tablet Commonly known as: VITAMIN D3 Take 2,000 Units by mouth daily.   feeding supplement (GLUCERNA SHAKE) Liqd Take 237 mLs by mouth 3 (three) times daily between meals.   hydrOXYzine 25 MG capsule Commonly known as: VISTARIL Take 1  capsule (25 mg total) by mouth at bedtime.   metFORMIN 500 MG tablet Commonly known as: GLUCOPHAGE TAKE (1) TABLET BY MOUTH TWICE DAILY.   mirabegron ER 25 MG Tb24 tablet Commonly known as: MYRBETRIQ Take 1 tablet (25 mg total) by mouth daily.   nitrofurantoin (macrocrystal-monohydrate) 100 MG capsule Commonly known as: Macrobid Take 1 capsule (100 mg total) by mouth 2 (two) times daily.   nitrofurantoin (macrocrystal-monohydrate) 100 MG capsule Commonly known as: MACROBID Take 1 capsule (100 mg total) by mouth every 12 (twelve) hours.   nitrofurantoin 50 MG capsule Commonly known as: Macrodantin Take 1 capsule (50 mg total) by mouth at bedtime.   olmesartan 20 MG tablet Commonly known as: BENICAR Take 1 tablet (20 mg total) by mouth daily.   simvastatin 40 MG tablet Commonly known as: ZOCOR Take 1 tablet (40 mg total) by mouth daily.        Allergies:  Allergies  Allergen Reactions   Penicillins     Family History: No family history on file.  Social History:  reports that she has never smoked. She has never used smokeless tobacco. She reports that she does not drink alcohol and does not use drugs.  ROS: All other review of systems were reviewed and are  negative except what is noted above in HPI  Physical Exam: BP 130/66   Pulse 79   Constitutional:  Alert and oriented, No acute distress. HEENT: [place] AT, moist mucus membranes.  Trachea midline, no masses. Cardiovascular: No clubbing, cyanosis, or edema. Respiratory: Normal respiratory effort, no increased work of breathing. GI: Abdomen is soft, nontender, nondistended, no abdominal masses GU: No CVA tenderness.  Lymph: No cervical or inguinal lymphadenopathy. Skin: No rashes, bruises or suspicious lesions. Neurologic: Grossly intact, no focal deficits, moving all 4 extremities. Psychiatric: Normal mood and affect.  Laboratory Data: Lab Results  Component Value Date   WBC 5.6 03/17/2022   HGB 10.0 (L)  03/17/2022   HCT 31.4 (L) 03/17/2022   MCV 82 03/17/2022   PLT 206 03/17/2022    Lab Results  Component Value Date   CREATININE 0.87 03/17/2022    No results found for: "PSA"  No results found for: "TESTOSTERONE"  Lab Results  Component Value Date   HGBA1C 7.0 (H) 03/17/2022    Urinalysis    Component Value Date/Time   APPEARANCEUR Clear 06/18/2022 1410   GLUCOSEU Negative 06/18/2022 1410   BILIRUBINUR Negative 06/18/2022 1410   KETONESUR negative 04/21/2022 1545   PROTEINUR Negative 06/18/2022 1410   UROBILINOGEN 0.2 04/21/2022 1545   NITRITE Negative 06/18/2022 1410   LEUKOCYTESUR 1+ (A) 06/18/2022 1410    Lab Results  Component Value Date   LABMICR See below: 06/18/2022   WBCUA 6-10 (A) 06/18/2022   LABEPIT 0-10 06/18/2022   BACTERIA Moderate (A) 06/18/2022    Pertinent Imaging:  Results for orders placed during the hospital encounter of 05/07/06  DG Abd 1 View  Narrative Clinical Data:  Postop. Abdominal symptoms. ABDOMEN - 1 VIEW: Findings: Mild gaseous distention of multiple small bowel loops compatible with nonspecific generalized ileus. Fairly generous amount of stool in the colon.  Transpedicle screws and interbody spacers at L4-5. The patient has posterolateral bony fusion as well.  Impression Query mild constipation. Nonspecific ileus.  Provider: Elvera Lennox  No results found for this or any previous visit.  No results found for this or any previous visit.  No results found for this or any previous visit.  No results found for this or any previous visit.  No valid procedures specified. No results found for this or any previous visit.  No results found for this or any previous visit.   Assessment & Plan:    1. History of UTI -continue macrodantin '50mg'$  qhs - Urinalysis, Routine w reflex microscopic  2. Nocturia -she defers therapy at this time   No follow-ups on file.  Nicolette Bang, MD  [facility] [place]

## 2022-08-01 NOTE — Patient Instructions (Signed)
Urinary Tract Infection, Adult  A urinary tract infection (UTI) is an infection of any part of the urinary tract. The urinary tract includes the kidneys, ureters, bladder, and urethra. These organs make, store, and get rid of urine in the body. An upper UTI affects the ureters and kidneys. A lower UTI affects the bladder and urethra. What are the causes? Most urinary tract infections are caused by bacteria in your genital area around your urethra, where urine leaves your body. These bacteria grow and cause inflammation of your urinary tract. What increases the risk? You are more likely to develop this condition if: You have a urinary catheter that stays in place. You are not able to control when you urinate or have a bowel movement (incontinence). You are female and you: Use a spermicide or diaphragm for birth control. Have low estrogen levels. Are pregnant. You have certain genes that increase your risk. You are sexually active. You take antibiotic medicines. You have a condition that causes your flow of urine to slow down, such as: An enlarged prostate, if you are female. Blockage in your urethra. A kidney stone. A nerve condition that affects your bladder control (neurogenic bladder). Not getting enough to drink, or not urinating often. You have certain medical conditions, such as: Diabetes. A weak disease-fighting system (immunesystem). Sickle cell disease. Gout. Spinal cord injury. What are the signs or symptoms? Symptoms of this condition include: Needing to urinate right away (urgency). Frequent urination. This may include small amounts of urine each time you urinate. Pain or burning with urination. Blood in the urine. Urine that smells bad or unusual. Trouble urinating. Cloudy urine. Vaginal discharge, if you are female. Pain in the abdomen or the lower back. You may also have: Vomiting or a decreased appetite. Confusion. Irritability or tiredness. A fever or  chills. Diarrhea. The first symptom in older adults may be confusion. In some cases, they may not have any symptoms until the infection has worsened. How is this diagnosed? This condition is diagnosed based on your medical history and a physical exam. You may also have other tests, including: Urine tests. Blood tests. Tests for STIs (sexually transmitted infections). If you have had more than one UTI, a cystoscopy or imaging studies may be done to determine the cause of the infections. How is this treated? Treatment for this condition includes: Antibiotic medicine. Over-the-counter medicines to treat discomfort. Drinking enough water to stay hydrated. If you have frequent infections or have other conditions such as a kidney stone, you may need to see a health care provider who specializes in the urinary tract (urologist). In rare cases, urinary tract infections can cause sepsis. Sepsis is a life-threatening condition that occurs when the body responds to an infection. Sepsis is treated in the hospital with IV antibiotics, fluids, and other medicines. Follow these instructions at home:  Medicines Take over-the-counter and prescription medicines only as told by your health care provider. If you were prescribed an antibiotic medicine, take it as told by your health care provider. Do not stop using the antibiotic even if you start to feel better. General instructions Make sure you: Empty your bladder often and completely. Do not hold urine for long periods of time. Empty your bladder after sex. Wipe from front to back after urinating or having a bowel movement if you are female. Use each tissue only one time when you wipe. Drink enough fluid to keep your urine pale yellow. Keep all follow-up visits. This is important. Contact a health   care provider if: Your symptoms do not get better after 1-2 days. Your symptoms go away and then return. Get help right away if: You have severe pain in  your back or your lower abdomen. You have a fever or chills. You have nausea or vomiting. Summary A urinary tract infection (UTI) is an infection of any part of the urinary tract, which includes the kidneys, ureters, bladder, and urethra. Most urinary tract infections are caused by bacteria in your genital area. Treatment for this condition often includes antibiotic medicines. If you were prescribed an antibiotic medicine, take it as told by your health care provider. Do not stop using the antibiotic even if you start to feel better. Keep all follow-up visits. This is important. This information is not intended to replace advice given to you by your health care provider. Make sure you discuss any questions you have with your health care provider. Document Revised: 12/23/2019 Document Reviewed: 12/23/2019 Elsevier Patient Education  2023 Elsevier Inc.  

## 2022-08-05 LAB — URINE CULTURE

## 2022-08-06 ENCOUNTER — Telehealth: Payer: Self-pay

## 2022-08-06 MED ORDER — SULFAMETHOXAZOLE-TRIMETHOPRIM 800-160 MG PO TABS: 1.0000 | ORAL_TABLET | Freq: Two times a day (BID) | ORAL | 0 refills | Status: DC

## 2022-08-06 NOTE — Telephone Encounter (Signed)
-----   Message from Cleon Gustin, MD sent at 08/06/2022  8:42 AM EDT ----- Bactrim DS BID for 7 days ----- Message ----- From: Iris Pert, LPN Sent: 08/14/2246   5:58 PM EDT To: Cleon Gustin, MD  No tx started. Patient on nightly Macrodantin

## 2022-08-06 NOTE — Telephone Encounter (Signed)
Patient called and made aware of positive urine culture and Bactrim DS sent to pharmacy. Patient made aware to stop nightly Macrodantin until 7 day course of Bactrim DS is completed. Patient voiced understanding.

## 2022-09-08 ENCOUNTER — Other Ambulatory Visit: Payer: Self-pay | Admitting: Nurse Practitioner

## 2022-09-08 DIAGNOSIS — E1165 Type 2 diabetes mellitus with hyperglycemia: Secondary | ICD-10-CM

## 2022-10-09 ENCOUNTER — Other Ambulatory Visit: Payer: Self-pay | Admitting: Family Medicine

## 2022-10-09 DIAGNOSIS — E1165 Type 2 diabetes mellitus with hyperglycemia: Secondary | ICD-10-CM

## 2022-10-21 DIAGNOSIS — E119 Type 2 diabetes mellitus without complications: Secondary | ICD-10-CM | POA: Diagnosis not present

## 2022-10-21 DIAGNOSIS — E559 Vitamin D deficiency, unspecified: Secondary | ICD-10-CM | POA: Diagnosis not present

## 2022-10-21 DIAGNOSIS — E038 Other specified hypothyroidism: Secondary | ICD-10-CM | POA: Diagnosis not present

## 2022-10-21 DIAGNOSIS — E7849 Other hyperlipidemia: Secondary | ICD-10-CM | POA: Diagnosis not present

## 2022-10-22 ENCOUNTER — Other Ambulatory Visit: Payer: Self-pay | Admitting: Family Medicine

## 2022-10-22 DIAGNOSIS — E1165 Type 2 diabetes mellitus with hyperglycemia: Secondary | ICD-10-CM

## 2022-10-22 DIAGNOSIS — D649 Anemia, unspecified: Secondary | ICD-10-CM

## 2022-10-22 DIAGNOSIS — E119 Type 2 diabetes mellitus without complications: Secondary | ICD-10-CM

## 2022-10-22 MED ORDER — RYBELSUS 3 MG PO TABS: 3.0000 mg | ORAL_TABLET | Freq: Every day | ORAL | 2 refills | Status: DC

## 2022-10-22 MED ORDER — METFORMIN HCL 500 MG PO TABS: 500.0000 mg | ORAL_TABLET | Freq: Two times a day (BID) | ORAL | 0 refills | Status: DC

## 2022-10-22 NOTE — Progress Notes (Signed)
Please inform the patient that her hemoglobin A1c has increased from 7.0 to 7.2.  I recommend that she starts taking metformin 500 mg twice daily.  A prescription for Rybelsus 3 mg daily sent to her pharmacy to start taking.  I also recommend avoiding high sugar foods and beverages with increased physical activity.    Please encouraged the patient to assess her iron panel as her hemoglobin hematocrit are slightly low.  I recommend increasing her dietary intake of iron rich foods with eggs, spinach, broccoli, lean chicken, and liver.

## 2022-10-23 ENCOUNTER — Telehealth: Payer: Self-pay | Admitting: Family Medicine

## 2022-10-23 LAB — CBC WITH DIFFERENTIAL/PLATELET
Basophils Absolute: 0 10*3/uL (ref 0.0–0.2)
Basos: 0 %
EOS (ABSOLUTE): 0.1 10*3/uL (ref 0.0–0.4)
Eos: 3 %
Hematocrit: 29.5 % — ABNORMAL LOW (ref 34.0–46.6)
Hemoglobin: 9 g/dL — ABNORMAL LOW (ref 11.1–15.9)
Immature Grans (Abs): 0.1 10*3/uL (ref 0.0–0.1)
Immature Granulocytes: 1 %
Lymphocytes Absolute: 1.1 10*3/uL (ref 0.7–3.1)
Lymphs: 23 %
MCH: 24.8 pg — ABNORMAL LOW (ref 26.6–33.0)
MCHC: 30.5 g/dL — ABNORMAL LOW (ref 31.5–35.7)
MCV: 81 fL (ref 79–97)
Monocytes Absolute: 0.5 10*3/uL (ref 0.1–0.9)
Monocytes: 9 %
Neutrophils Absolute: 3.2 10*3/uL (ref 1.4–7.0)
Neutrophils: 64 %
Platelets: 152 10*3/uL (ref 150–450)
RBC: 3.63 x10E6/uL — ABNORMAL LOW (ref 3.77–5.28)
RDW: 15.7 % — ABNORMAL HIGH (ref 11.7–15.4)
WBC: 5 10*3/uL (ref 3.4–10.8)

## 2022-10-23 LAB — CMP14+EGFR
ALT: 17 IU/L (ref 0–32)
AST: 21 IU/L (ref 0–40)
Albumin/Globulin Ratio: 2 (ref 1.2–2.2)
Albumin: 4.3 g/dL (ref 3.7–4.7)
Alkaline Phosphatase: 60 IU/L (ref 44–121)
BUN/Creatinine Ratio: 29 — ABNORMAL HIGH (ref 12–28)
BUN: 25 mg/dL (ref 8–27)
Bilirubin Total: 0.3 mg/dL (ref 0.0–1.2)
CO2: 20 mmol/L (ref 20–29)
Calcium: 9.6 mg/dL (ref 8.7–10.3)
Chloride: 102 mmol/L (ref 96–106)
Creatinine, Ser: 0.87 mg/dL (ref 0.57–1.00)
Globulin, Total: 2.1 g/dL (ref 1.5–4.5)
Glucose: 156 mg/dL — ABNORMAL HIGH (ref 70–99)
Potassium: 4.5 mmol/L (ref 3.5–5.2)
Sodium: 137 mmol/L (ref 134–144)
Total Protein: 6.4 g/dL (ref 6.0–8.5)
eGFR: 66 mL/min/{1.73_m2} (ref >59)

## 2022-10-23 LAB — LIPID PANEL
Chol/HDL Ratio: 2.1 ratio (ref 0.0–4.4)
Cholesterol, Total: 143 mg/dL (ref 100–199)
HDL: 68 mg/dL (ref >39)
LDL Chol Calc (NIH): 58 mg/dL (ref 0–99)
Triglycerides: 89 mg/dL (ref 0–149)
VLDL Cholesterol Cal: 17 mg/dL (ref 5–40)

## 2022-10-23 LAB — HEMOGLOBIN A1C
Est. average glucose Bld gHb Est-mCnc: 160 mg/dL
Hgb A1c MFr Bld: 7.2 % — ABNORMAL HIGH (ref 4.8–5.6)

## 2022-10-23 LAB — TSH+FREE T4
Free T4: 1.14 ng/dL (ref 0.82–1.77)
TSH: 2.68 u[IU]/mL (ref 0.450–4.500)

## 2022-10-23 LAB — MICROALBUMIN / CREATININE URINE RATIO
Creatinine, Urine: 61.5 mg/dL
Microalb/Creat Ratio: 13 mg/g creat (ref 0–29)
Microalbumin, Urine: 7.9 ug/mL

## 2022-10-23 LAB — VITAMIN D 25 HYDROXY (VIT D DEFICIENCY, FRACTURES): Vit D, 25-Hydroxy: 44.1 ng/mL (ref 30.0–100.0)

## 2022-10-23 NOTE — Telephone Encounter (Signed)
Spoke to pt went over labs 

## 2022-10-23 NOTE — Telephone Encounter (Signed)
Pt called and stated she had a missed call in regards to labs.

## 2022-10-27 ENCOUNTER — Encounter: Payer: Self-pay | Admitting: Family Medicine

## 2022-10-27 ENCOUNTER — Ambulatory Visit (INDEPENDENT_AMBULATORY_CARE_PROVIDER_SITE_OTHER): Payer: Medicare Other | Admitting: Family Medicine

## 2022-10-27 VITALS — BP 128/78 | HR 85 | Ht 64.5 in | Wt 148.0 lb

## 2022-10-27 DIAGNOSIS — N3090 Cystitis, unspecified without hematuria: Secondary | ICD-10-CM

## 2022-10-27 DIAGNOSIS — E119 Type 2 diabetes mellitus without complications: Secondary | ICD-10-CM

## 2022-10-27 DIAGNOSIS — Z7984 Long term (current) use of oral hypoglycemic drugs: Secondary | ICD-10-CM

## 2022-10-27 DIAGNOSIS — E611 Iron deficiency: Secondary | ICD-10-CM | POA: Diagnosis not present

## 2022-10-27 DIAGNOSIS — Z87440 Personal history of urinary (tract) infections: Secondary | ICD-10-CM

## 2022-10-27 DIAGNOSIS — R102 Pelvic and perineal pain: Secondary | ICD-10-CM

## 2022-10-27 DIAGNOSIS — I10 Essential (primary) hypertension: Secondary | ICD-10-CM

## 2022-10-27 LAB — POCT URINALYSIS DIPSTICK
Bilirubin, UA: NEGATIVE
Glucose, UA: NEGATIVE
Ketones, UA: NEGATIVE
Nitrite, UA: POSITIVE
Protein, UA: NEGATIVE
Spec Grav, UA: 1.015 (ref 1.010–1.025)
Urobilinogen, UA: 0.2 E.U./dL
pH, UA: 5.5 (ref 5.0–8.0)

## 2022-10-27 MED ORDER — NITROFURANTOIN MACROCRYSTAL 50 MG PO CAPS: 50.0000 mg | ORAL_CAPSULE | Freq: Every day | ORAL | 11 refills | Status: DC

## 2022-10-27 NOTE — Assessment & Plan Note (Signed)
Controlled She takes olmesartan 20mg  daily Reports compliance with treatment regimen She denies headaches, dizziness, blurred vision Encouraged to continue treatment regimen BP Readings from Last 3 Encounters:  10/27/22 128/78  08/01/22 130/66  06/27/22 133/71

## 2022-10-27 NOTE — Assessment & Plan Note (Signed)
Encouraged to start taking metformin 500 mg twice daily and Rybelsus 3 mg daily Denies polyuria, polyphagia, polydipsia Encouraged to avoid high sugar foods and beverages with increased physical activity

## 2022-10-27 NOTE — Assessment & Plan Note (Signed)
The patient is currently on suppressive therapy with Macrobid 50 mg nightly daily UA is positive for nitrates and leukocytes Encouraged patient to take 2 tablet (100 mg total) twice daily for 5 days of Macrobid and to resume therapy on 50 mg nightly after completion of 5 days therapy Will follow-up with patient in a week to assess for UTI clearing Of note, the patient has urinary incontinence and reports frequently changing her urinary pads  She is following up with urology on 10/17/2022 Please complete the full course of the antibiotics   You can help prevent UTIs by doing the following:  -Avoid holding urine for prolonged periods; this stretches the bladder and causes bacteria to form because bacteria like warm and wet environments to grow -Empty the bladder as soon as the need arises.  -Empty your bladder soon after intercourse.  -Take showers instead of baths -Wipe front to back; doing so after urinating and after a bowel movement helps prevent bacteria in the anal region from spreading to the vagina and urethra. -Also, drink a full glass of water to help flush bacteria.

## 2022-10-27 NOTE — Patient Instructions (Addendum)
I appreciate the opportunity to provide care to you today!    Follow up:  1 week uti f/u  Labs: please stop by the lab today to get your blood drawn ( iron panel)  Please start taking Nitrofurantoin 2 tablets (100 mg total) twice daily for 5 days  You may resume therapy to 50 mg nightly of Nitrofurantoin after completing the 5 days course  Please complete the full course of the antibiotics   You can help prevent UTIs by doing the following:  -Avoid holding urine for prolonged periods; this stretches the bladder and causes bacteria to form because bacteria like warm and wet environments to grow -Empty the bladder as soon as the need arises.  -Empty your bladder soon after intercourse.  -Take showers instead of baths -Wipe front to back; doing so after urinating and after a bowel movement helps prevent bacteria in the anal region from spreading to the vagina and urethra. -Also, drink a full glass of water to help flush bacteria.    Iron rich foods Whole-wheat breads, cereals,  bagels, Dried apricots, dried figs,Spinach, soybeans, Asparagus, Brussels sprouts, mushrooms, green peas, white or sweet potato with the skin, tomato sauce, beets, beans such as garbanzo or lima, Greens such as collard, turnip, kale, beet, or Swiss chard. Beef, veal, lamb, pork, beef or chicken liver, clams, sardines, oysters, shrimp, tofu, baked beans with pork, lentils, tahini, tempeh. Beans such as kidney, lima, navy, or white.Chicken breast, [place], eggs, fresh or canned tuna or mackerel.Molasses, soy milk, seeds such as sesame or sunflower. Nuts such as almonds, pistachios, cashews, and walnuts.Pumpkin seeds.     Please continue to a heart-healthy diet and increase your physical activities. Try to exercise for at least five days a week.      It was a pleasure to see you and I look forward to continuing to work together on your health and well-being. Please do not hesitate to call the office if you need  care or have questions about your care.   Have a wonderful day and week. With Gratitude, Gilmore Laroche MSN, FNP-BC

## 2022-10-27 NOTE — Progress Notes (Signed)
Established Patient Office Visit  Subjective:  Patient ID: Gloria Owens, female    DOB: May 27, 1935  Age: 87 y.o. MRN: [id number]  CC:  Chief Complaint  Patient presents with   Chronic Care Management    4 month f/u, has questions regarding lab work.    HPI Gloria Owens is a 87 y.o. female with past medical history of hypertension, type 2 diabetes, and vitamin D deficiency presents for f/u of  chronic medical conditions. For the details of today's visit, please refer to the assessment and plan.     Past Medical History:  Diagnosis Date   Hypercholesteremia    Seizures (HCC)    with eye surgery several years ago-unknown etiology-no meds    Past Surgical History:  Procedure Laterality Date   ABDOMINAL HYSTERECTOMY     total hysterectomy   BACK SURGERY     fusion of back x2 and 1 other back surgery   CHOLECYSTECTOMY     COLON RESECTION     from endometriosis   CYSTOSCOPY     fulgeration of tumors   HERNIA REPAIR Right    INGUINAL HERNIA REPAIR Left 06/20/2013   Procedure: HERNIA REPAIR INGUINAL ADULT;  Surgeon: Dalia Heading, MD;  Location: [facility];  Service: General;  Laterality: Left;   INSERTION OF MESH Left 06/20/2013   Procedure: INSERTION OF MESH;  Surgeon: Dalia Heading, MD;  Location: [facility];  Service: General;  Laterality: Left;    History reviewed. No pertinent family history.  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  Tobacco Use   Smoking status: Never   Smokeless tobacco: Never  Vaping Use   Vaping Use: Never used  Substance and Sexual Activity   Alcohol use: No   Drug use: No   Sexual activity: Not Currently    Birth control/protection: Surgical  Other Topics Concern   Not on file  Social History  Narrative   No children- had endometriosis an dhysterectomy; husband recently deceased   Nephew is Bellamia Tarman- security guard at [employer]   Social Determinants of [employer]: Low Risk  (11/14/2020)   Overall Financial Resource Strain (CARDIA)    Difficulty of Paying Living Expenses: Not hard at all  Food Insecurity: No Food Insecurity (01/24/2021)   Hunger Vital Sign    Worried About Running Out of Food in the Last Year: Never true    Ran Out of Food in the Last Year: Never true  Transportation Needs: No Transportation Needs (01/24/2021)   PRAPARE - Administrator, Civil Service (Medical): No    Lack of Transportation (Non-Medical): No  Physical Activity: Sufficiently Active (11/14/2020)   Exercise Vital Sign    Days of Exercise per Week: 7 days    Minutes of Exercise per Session: 30 min  Stress: No Stress Concern Present (11/14/2020)   [employer] of Occupational Health - Occupational Stress Questionnaire    Feeling of Stress : Not at all  Social Connections: Moderately Isolated (11/14/2020)   Social Connection and Isolation Panel [NHANES]    Frequency of Communication with Friends and Family: More than three times a week    Frequency of  Social Gatherings with Friends and Family: More than three times a week    Attends Religious Services: More than 4 times per year    Active Member of [employer] or Organizations: No    Attends Banker Meetings: Never    Marital Status: Widowed  Intimate Partner Violence: Not At Risk (11/14/2020)   Humiliation, Afraid, Rape, and Kick questionnaire    Fear of Current or Ex-Partner: No    Emotionally Abused: No    Physically Abused: No    Sexually Abused: No    Outpatient Medications Prior to Visit  Medication Sig Dispense Refill   aspirin EC 81 MG tablet Take 81 mg by mouth daily.     Calcium Carb-Cholecalciferol (CALTRATE 600+D3) 600-20 MG-MCG TABS Take 600 mg by mouth 2 (two) times daily. 60 tablet 4    cholecalciferol (VITAMIN D3) 25 MCG (1000 UNIT) tablet Take 2,000 Units by mouth daily.     feeding supplement, GLUCERNA SHAKE, (GLUCERNA SHAKE) LIQD Take 237 mLs by mouth 3 (three) times daily between meals. 30 mL 2   hydrOXYzine (VISTARIL) 25 MG capsule Take 1 capsule (25 mg total) by mouth at bedtime. 30 capsule 0   metFORMIN (GLUCOPHAGE) 500 MG tablet Take 1 tablet (500 mg total) by mouth 2 (two) times daily with a meal. 180 tablet 0   mirabegron ER (MYRBETRIQ) 25 MG TB24 tablet Take 1 tablet (25 mg total) by mouth daily. 30 tablet 0   nitrofurantoin, macrocrystal-monohydrate, (MACROBID) 100 MG capsule Take 1 capsule (100 mg total) by mouth 2 (two) times daily. 10 capsule 0   nitrofurantoin, macrocrystal-monohydrate, (MACROBID) 100 MG capsule Take 1 capsule (100 mg total) by mouth every 12 (twelve) hours. 14 capsule 0   olmesartan (BENICAR) 20 MG tablet Take 1 tablet (20 mg total) by mouth daily. 90 tablet 1   Semaglutide (RYBELSUS) 3 MG TABS Take 1 tablet (3 mg total) by mouth daily. 30 tablet 2   simvastatin (ZOCOR) 40 MG tablet Take 1 tablet (40 mg total) by mouth daily. 90 tablet 1   sulfamethoxazole-trimethoprim (BACTRIM DS) 800-160 MG tablet Take 1 tablet by mouth 2 (two) times daily. 14 tablet 0   nitrofurantoin (MACRODANTIN) 50 MG capsule Take 1 capsule (50 mg total) by mouth at bedtime. 30 capsule 11   No facility-administered medications prior to visit.    Allergies  Allergen Reactions   Penicillins     ROS Review of Systems  Constitutional:  Negative for chills and fever.  Eyes:  Negative for visual disturbance.  Respiratory:  Negative for chest tightness and shortness of breath.   Gastrointestinal:  Negative for blood in stool, diarrhea, nausea and vomiting.  Genitourinary:  Negative for dysuria, flank pain and urgency.       Left lower quadrant pain  Neurological:  Negative for dizziness and headaches.      Objective:    Physical Exam HENT:     Head:  Normocephalic.     Mouth/Throat:     Mouth: Mucous membranes are moist.  Cardiovascular:     Rate and Rhythm: Normal rate.     Heart sounds: Normal heart sounds.  Pulmonary:     Effort: Pulmonary effort is normal.     Breath sounds: Normal breath sounds.  Abdominal:     Tenderness: There is abdominal tenderness in the suprapubic area and left lower quadrant. There is no right CVA tenderness or left CVA tenderness.  Neurological:     Mental Status: She is alert.  BP 128/78 (BP Location: Left Arm)   Pulse 85   Ht 5' 4.5" (1.638 m)   Wt 148 lb 0.6 oz (67.2 kg)   SpO2 96%   BMI 25.02 kg/m  Wt Readings from Last 3 Encounters:  10/27/22 148 lb 0.6 oz (67.2 kg)  06/27/22 149 lb 1.9 oz (67.6 kg)  04/21/22 150 lb 1.9 oz (68.1 kg)    Lab Results  Component Value Date   TSH 2.680 10/21/2022   Lab Results  Component Value Date   WBC 5.0 10/21/2022   HGB 9.0 (L) 10/21/2022   HCT 29.5 (L) 10/21/2022   MCV 81 10/21/2022   PLT 152 10/21/2022   Lab Results  Component Value Date   NA 137 10/21/2022   K 4.5 10/21/2022   CO2 20 10/21/2022   GLUCOSE 156 (H) 10/21/2022   BUN 25 10/21/2022   CREATININE 0.87 10/21/2022   BILITOT 0.3 10/21/2022   ALKPHOS 60 10/21/2022   AST 21 10/21/2022   ALT 17 10/21/2022   PROT 6.4 10/21/2022   ALBUMIN 4.3 10/21/2022   CALCIUM 9.6 10/21/2022   EGFR 66 10/21/2022   Lab Results  Component Value Date   CHOL 143 10/21/2022   Lab Results  Component Value Date   HDL 68 10/21/2022   Lab Results  Component Value Date   LDLCALC 58 10/21/2022   Lab Results  Component Value Date   TRIG 89 10/21/2022   Lab Results  Component Value Date   CHOLHDL 2.1 10/21/2022   Lab Results  Component Value Date   HGBA1C 7.2 (H) 10/21/2022      Assessment & Plan:  Recurrent cystitis Assessment & Plan: The patient is currently on suppressive therapy with Macrobid 50 mg nightly daily UA is positive for nitrates and leukocytes Encouraged patient  to take 2 tablet (100 mg total) twice daily for 5 days of Macrobid and to resume therapy on 50 mg nightly after completion of 5 days therapy Will follow-up with patient in a week to assess for UTI clearing Of note, the patient has urinary incontinence and reports frequently changing her urinary pads  She is following up with urology on 10/17/2022 Please complete the full course of the antibiotics   You can help prevent UTIs by doing the following:  -Avoid holding urine for prolonged periods; this stretches the bladder and causes bacteria to form because bacteria like warm and wet environments to grow -Empty the bladder as soon as the need arises.  -Empty your bladder soon after intercourse.  -Take showers instead of baths -Wipe front to back; doing so after urinating and after a bowel movement helps prevent bacteria in the anal region from spreading to the vagina and urethra. -Also, drink a full glass of water to help flush bacteria.    Suprapubic pain -     POCT urinalysis dipstick -     Urine Culture  History of UTI -     Nitrofurantoin Macrocrystal; Take 1 capsule (50 mg total) by mouth at bedtime.  Dispense: 30 capsule; Refill: 11  Iron deficiency -     Iron, TIBC and Ferritin Panel  Type 2 diabetes mellitus without complication, without long-term current use of insulin (HCC) Assessment & Plan: Encouraged to start taking metformin 500 mg twice daily and Rybelsus 3 mg daily Denies polyuria, polyphagia, polydipsia Encouraged to avoid high sugar foods and beverages with increased physical activity   Hypertension, essential Assessment & Plan: Controlled She takes olmesartan 20mg  daily Reports compliance with  treatment regimen She denies headaches, dizziness, blurred vision Encouraged to continue treatment regimen BP Readings from Last 3 Encounters:  10/27/22 128/78  08/01/22 130/66  06/27/22 133/71        Follow-up: Return in about 1 week (around 11/03/2022) for UTI  clearing.   Gilmore Laroche, FNP

## 2022-10-28 ENCOUNTER — Other Ambulatory Visit: Payer: Self-pay | Admitting: Family Medicine

## 2022-10-28 DIAGNOSIS — D508 Other iron deficiency anemias: Secondary | ICD-10-CM

## 2022-10-28 MED ORDER — IRON (FERROUS SULFATE) 325 (65 FE) MG PO TABS: 325.0000 mg | ORAL_TABLET | Freq: Every day | ORAL | 2 refills | Status: AC

## 2022-10-28 NOTE — Progress Notes (Signed)
Please inform the patient that a referral has been placed to hematology for iron deficiency anemia.  I have also started her on ferrous Sulfate 325 mg to take daily.  I recommend taking this medication with orange juice to aid with absorption.  I also recommended increasing her dietary intake of iron rich foods that was reviewed during her visit.

## 2022-10-29 LAB — IRON,TIBC AND FERRITIN PANEL
Ferritin: 9 ng/mL — ABNORMAL LOW (ref 15–150)
Iron Saturation: 4 % — CL (ref 15–55)
Iron: 18 ug/dL — ABNORMAL LOW (ref 27–139)
Total Iron Binding Capacity: 450 ug/dL (ref 250–450)
UIBC: 432 ug/dL — ABNORMAL HIGH (ref 118–369)

## 2022-11-02 LAB — URINE CULTURE

## 2022-11-03 ENCOUNTER — Other Ambulatory Visit: Payer: Self-pay | Admitting: Family Medicine

## 2022-11-04 ENCOUNTER — Inpatient Hospital Stay: Payer: Medicare Other | Attending: Hematology | Admitting: Hematology

## 2022-11-04 ENCOUNTER — Other Ambulatory Visit: Payer: Self-pay | Admitting: Family Medicine

## 2022-11-04 VITALS — BP 128/71 | HR 82 | Temp 98.1°F | Resp 18 | Ht 64.5 in | Wt 148.1 lb

## 2022-11-04 DIAGNOSIS — R531 Weakness: Secondary | ICD-10-CM | POA: Insufficient documentation

## 2022-11-04 DIAGNOSIS — N3090 Cystitis, unspecified without hematuria: Secondary | ICD-10-CM

## 2022-11-04 DIAGNOSIS — D509 Iron deficiency anemia, unspecified: Secondary | ICD-10-CM | POA: Insufficient documentation

## 2022-11-04 DIAGNOSIS — Z801 Family history of malignant neoplasm of trachea, bronchus and lung: Secondary | ICD-10-CM | POA: Diagnosis not present

## 2022-11-04 DIAGNOSIS — D508 Other iron deficiency anemias: Secondary | ICD-10-CM

## 2022-11-04 MED ORDER — SULFAMETHOXAZOLE-TRIMETHOPRIM 800-160 MG PO TABS
1.0000 | ORAL_TABLET | Freq: Two times a day (BID) | ORAL | 0 refills | Status: AC
Start: 2022-11-04 — End: 2022-11-09

## 2022-11-04 NOTE — Patient Instructions (Signed)
You were seen and examined today by Dr. Katragadda. Dr. Katragadda is a hematologist, meaning that he specializes in blood abnormalities. Dr. Katragadda discussed your past medical history, family history of cancers/blood conditions and the events that led to you being here today.  You were referred to Dr. Katragadda due to iron deficiency anemia.  Dr. Katragadda has recommended additional labs today for further evaluation.  Follow-up as scheduled.  

## 2022-11-04 NOTE — Progress Notes (Signed)
Please inform the patient that her urine culture shows resistance to Macrobid, which is the antibiotic that she was recently treated with.  I have sent a prescription of for Bactrim twice daily for 5 days to take.  Please encourage the patient to complete the full course of antibiotic and return in a week for follow-up.

## 2022-11-04 NOTE — Progress Notes (Signed)
CONSULT NOTE  Patient Care Team: Gilmore Laroche, FNP as PCP - General (Family Medicine)  CHIEF COMPLAINTS/PURPOSE OF CONSULTATION:  Severe iron deficiency anemia  HISTORY OF PRESENTING ILLNESS:  Gloria Owens 87 y.o. female is seen in consultation today at the request of Gilmore Laroche, FNP for further management of severe iron deficiency anemia.  She had an abnormal CBC from 10/21/2022 with hemoglobin 9 and MCV 81.  Labs on 10/27/2022 showed ferritin low at 9 and percent saturation of 4.  She does not have chronic kidney disease.  She reports feeling very weak for the last 1 month.  Denies any BRBPR/melena.  No ice pica.  She has been on iron tablet for the last 1 week.  She reports that she could not have meaningful colonoscopy because of history of colon resection when most of her colon was removed.  She had transfusion with her 3 back surgeries.  Never received IV iron.  She lives by herself and is independent of ADLs and IADLs.    MEDICAL HISTORY:  Past Medical History:  Diagnosis Date   Hypercholesteremia    Seizures (HCC)    with eye surgery several years ago-unknown etiology-no meds    SURGICAL HISTORY: Past Surgical History:  Procedure Laterality Date   ABDOMINAL HYSTERECTOMY     total hysterectomy   BACK SURGERY     fusion of back x2 and 1 other back surgery   CHOLECYSTECTOMY     COLON RESECTION     from endometriosis   CYSTOSCOPY     fulgeration of tumors   HERNIA REPAIR Right    INGUINAL HERNIA REPAIR Left 06/20/2013   Procedure: HERNIA REPAIR INGUINAL ADULT;  Surgeon: Dalia Heading, MD;  Location: [facility];  Service: General;  Laterality: Left;   INSERTION OF MESH Left 06/20/2013   Procedure: INSERTION OF MESH;  Surgeon: Dalia Heading, MD;  Location: [facility];  Service: General;  Laterality: Left;    SOCIAL HISTORY: Social History   Socioeconomic History   Marital status: Widowed    Spouse name: Not on file   Number of children: Not on file   Years of  education: Not on file   Highest education level: Not on file  Occupational History   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  Tobacco Use   Smoking status: Never   Smokeless tobacco: Never  Vaping Use   Vaping Use: Never used  Substance and Sexual Activity   Alcohol use: No   Drug use: No   Sexual activity: Not Currently    Birth control/protection: Surgical  Other Topics Concern   Not on file  Social History Narrative   No children- had endometriosis an dhysterectomy; husband recently deceased   Nephew is Tyaisa Aschoff- security guard at [employer]   Social Determinants of [employer]: Low Risk  (11/14/2020)   Overall Financial Resource Strain (CARDIA)    Difficulty of Paying Living Expenses: Not hard at all  Food Insecurity: No Food Insecurity (01/24/2021)   Hunger Vital Sign    Worried About Running Out of Food in the Last Year: Never true    Ran Out of Food in the Last Year: Never true  Transportation Needs: No Transportation Needs (01/24/2021)   PRAPARE - Administrator, Civil Service (Medical): No    Lack of Transportation (Non-Medical): No  Physical Activity: Sufficiently Active (11/14/2020)   Exercise Vital Sign    Days of Exercise  per Week: 7 days    Minutes of Exercise per Session: 30 min  Stress: No Stress Concern Present (11/14/2020)   [employer] of Occupational Health - Occupational Stress Questionnaire    Feeling of Stress : Not at all  Social Connections: Moderately Isolated (11/14/2020)   Social Connection and Isolation Panel [NHANES]    Frequency of Communication with Friends and Family: More than three times a week    Frequency of Social Gatherings with Friends and Family: More than three times a week    Attends Religious Services: More than 4 times per year    Active Member of [employer] or Organizations: No    Attends Banker Meetings: Never    Marital Status: Widowed  Intimate Partner  Violence: Not At Risk (11/14/2020)   Humiliation, Afraid, Rape, and Kick questionnaire    Fear of Current or Ex-Partner: No    Emotionally Abused: No    Physically Abused: No    Sexually Abused: No    FAMILY HISTORY: No family history on file.  ALLERGIES:  is allergic to penicillins.  MEDICATIONS:  Current Outpatient Medications  Medication Sig Dispense Refill   aspirin EC 81 MG tablet Take 81 mg by mouth daily.     Calcium Carb-Cholecalciferol (CALTRATE 600+D3) 600-20 MG-MCG TABS Take 600 mg by mouth 2 (two) times daily. 60 tablet 4   cholecalciferol (VITAMIN D3) 25 MCG (1000 UNIT) tablet Take 2,000 Units by mouth daily.     feeding supplement, GLUCERNA SHAKE, (GLUCERNA SHAKE) LIQD Take 237 mLs by mouth 3 (three) times daily between meals. 30 mL 2   hydrOXYzine (VISTARIL) 25 MG capsule Take 1 capsule (25 mg total) by mouth at bedtime. 30 capsule 0   Iron, Ferrous Sulfate, 325 (65 Fe) MG TABS Take 325 mg by mouth daily. 30 tablet 2   metFORMIN (GLUCOPHAGE) 500 MG tablet Take 1 tablet (500 mg total) by mouth 2 (two) times daily with a meal. 180 tablet 0   mirabegron ER (MYRBETRIQ) 25 MG TB24 tablet Take 1 tablet (25 mg total) by mouth daily. 30 tablet 0   olmesartan (BENICAR) 20 MG tablet Take 1 tablet (20 mg total) by mouth daily. 90 tablet 1   Semaglutide (RYBELSUS) 3 MG TABS Take 1 tablet (3 mg total) by mouth daily. 30 tablet 2   simvastatin (ZOCOR) 40 MG tablet Take 1 tablet (40 mg total) by mouth daily. 90 tablet 1   sulfamethoxazole-trimethoprim (BACTRIM DS) 800-160 MG tablet Take 1 tablet by mouth 2 (two) times daily. 14 tablet 0   sulfamethoxazole-trimethoprim (BACTRIM DS) 800-160 MG tablet Take 1 tablet by mouth 2 (two) times daily for 5 days. 10 tablet 0   No current facility-administered medications for this visit.    REVIEW OF SYSTEMS:   Constitutional: Denies fevers, chills or abnormal night sweats.  Positive for fatigue for the last 1 month. Eyes: Denies blurriness of  vision, double vision or watery eyes Ears, nose, mouth, throat, and face: Denies mucositis or sore throat Respiratory: Denies cough, dyspnea or wheezes Cardiovascular: Denies palpitation, chest discomfort or lower extremity swelling Gastrointestinal:  Denies nausea, heartburn or change in bowel habits Skin: Denies abnormal skin rashes Lymphatics: Denies new lymphadenopathy or easy bruising Neurological:Denies numbness, tingling or new weaknesses Behavioral/Psych: Mood is stable, no new changes  All other systems were reviewed with the patient and are negative.  PHYSICAL EXAMINATION: ECOG PERFORMANCE STATUS: 1 - Symptomatic but completely ambulatory  Vitals:   11/04/22 1257  BP: 128/71  Pulse: 82  Resp: 18  Temp: 98.1 F (36.7 C)  SpO2: 96%   Filed Weights   11/04/22 1257  Weight: 148 lb 1.6 oz (67.2 kg)    GENERAL:alert, no distress and comfortable SKIN: skin color, texture, turgor are normal, no rashes or significant lesions EYES: normal, conjunctiva are pink and non-injected, sclera clear OROPHARYNX:no exudate, no erythema and lips, buccal mucosa, and tongue normal  NECK: supple, thyroid normal size, non-tender, without nodularity LYMPH:  no palpable lymphadenopathy in the cervical, axillary or inguinal LUNGS: clear to auscultation and percussion with normal breathing effort HEART: regular rate & rhythm and no murmurs and no lower extremity edema ABDOMEN:abdomen soft, non-tender and normal bowel sounds Musculoskeletal:no cyanosis of digits and no clubbing  PSYCH: alert & oriented x 3 with fluent speech NEURO: no focal motor/sensory deficits  LABORATORY DATA:  I have reviewed the data as listed Recent Results (from the past 2160 hour(s))  Microalbumin / creatinine urine ratio     Status: None   Collection Time: 10/21/22  8:13 AM  Result Value Ref Range   Creatinine, Urine 61.5 Not Estab. mg/dL   Microalbumin, Urine 7.9 Not Estab. ug/mL   Microalb/Creat Ratio 13 0 -  29 mg/g creat    Comment:                        Normal:                0 -  29                        Moderately increased: 30 - 300                        Severely increased:       >300   Hemoglobin A1c     Status: Abnormal   Collection Time: 10/21/22  8:13 AM  Result Value Ref Range   Hgb A1c MFr Bld 7.2 (H) 4.8 - 5.6 %    Comment:          Prediabetes: 5.7 - 6.4          Diabetes: >6.4          Glycemic control for adults with diabetes: <7.0    Est. average glucose Bld gHb Est-mCnc 160 mg/dL  VITAMIN D 25 Hydroxy (Vit-D Deficiency, Fractures)     Status: None   Collection Time: 10/21/22  8:13 AM  Result Value Ref Range   Vit D, 25-Hydroxy 44.1 30.0 - 100.0 ng/mL    Comment: Vitamin D deficiency has been defined by the Institute of Medicine and an Endocrine Society practice guideline as a level of serum 25-OH vitamin D less than 20 ng/mL (1,2). The Endocrine Society went on to further define vitamin D insufficiency as a level between 21 and 29 ng/mL (2). 1. IOM (Institute of Medicine). 2010. Dietary reference    intakes for calcium and D. [place]: The    [employer]. 2. Holick MF, Binkley [place], Bischoff-Ferrari HA, et al.    Evaluation, treatment, and prevention of vitamin D    deficiency: an Endocrine Society clinical practice    guideline. JCEM. 2011 Jul; 96(7):1911-30.   TSH + free T4     Status: None   Collection Time: 10/21/22  8:13 AM  Result Value Ref Range   TSH 2.680 0.450 - 4.500 uIU/mL   Free T4 1.14 0.82 -  1.77 ng/dL  Lipid panel     Status: None   Collection Time: 10/21/22  8:13 AM  Result Value Ref Range   Cholesterol, Total 143 100 - 199 mg/dL   Triglycerides 89 0 - 149 mg/dL   HDL 68 >96 mg/dL   VLDL Cholesterol Cal 17 5 - 40 mg/dL   LDL Chol Calc (NIH) 58 0 - 99 mg/dL   Chol/HDL Ratio 2.1 0.0 - 4.4 ratio    Comment:                                   T. Chol/HDL Ratio                                             Men  Women                                1/2 Avg.Risk  3.4    3.3                                   Avg.Risk  5.0    4.4                                2X Avg.Risk  9.6    7.1                                3X Avg.Risk 23.4   11.0   CMP14+EGFR     Status: Abnormal   Collection Time: 10/21/22  8:13 AM  Result Value Ref Range   Glucose 156 (H) 70 - 99 mg/dL   BUN 25 8 - 27 mg/dL   Creatinine, Ser 0.45 0.57 - 1.00 mg/dL   eGFR 66 >40 JW/JXB/1.47   BUN/Creatinine Ratio 29 (H) 12 - 28   Sodium 137 134 - 144 mmol/L   Potassium 4.5 3.5 - 5.2 mmol/L   Chloride 102 96 - 106 mmol/L   CO2 20 20 - 29 mmol/L   Calcium 9.6 8.7 - 10.3 mg/dL   Total Protein 6.4 6.0 - 8.5 g/dL   Albumin 4.3 3.7 - 4.7 g/dL   Globulin, Total 2.1 1.5 - 4.5 g/dL   Albumin/Globulin Ratio 2.0 1.2 - 2.2   Bilirubin Total 0.3 0.0 - 1.2 mg/dL   Alkaline Phosphatase 60 44 - 121 IU/L   AST 21 0 - 40 IU/L   ALT 17 0 - 32 IU/L  CBC with Differential/Platelet     Status: Abnormal   Collection Time: 10/21/22  8:13 AM  Result Value Ref Range   WBC 5.0 3.4 - 10.8 x10E3/uL   RBC 3.63 (L) 3.77 - 5.28 x10E6/uL   Hemoglobin 9.0 (L) 11.1 - 15.9 g/dL   Hematocrit 82.9 (L) 56.2 - 46.6 %   MCV 81 79 - 97 fL   MCH 24.8 (L) 26.6 - 33.0 pg   MCHC 30.5 (L) 31.5 - 35.7 g/dL   RDW 13.0 (H) 86.5 - 78.4 %   Platelets 152 150 - 450 x10E3/uL   Neutrophils 64 Not Estab. %  Lymphs 23 Not Estab. %   Monocytes 9 Not Estab. %   Eos 3 Not Estab. %   Basos 0 Not Estab. %   Neutrophils Absolute 3.2 1.4 - 7.0 x10E3/uL   Lymphocytes Absolute 1.1 0.7 - 3.1 x10E3/uL   Monocytes Absolute 0.5 0.1 - 0.9 x10E3/uL   EOS (ABSOLUTE) 0.1 0.0 - 0.4 x10E3/uL   Basophils Absolute 0.0 0.0 - 0.2 x10E3/uL   Immature Granulocytes 1 Not Estab. %   Immature Grans (Abs) 0.1 0.0 - 0.1 x10E3/uL  POCT urinalysis dipstick     Status: Abnormal   Collection Time: 10/27/22  2:01 PM  Result Value Ref Range   Color, UA yellow    Clarity, UA cloudy    Glucose, UA Negative Negative    Bilirubin, UA neg    Ketones, UA neg    Spec Grav, UA 1.015 1.010 - 1.025   Blood, UA small    pH, UA 5.5 5.0 - 8.0   Protein, UA Negative Negative   Urobilinogen, UA 0.2 0.2 or 1.0 E.U./dL   Nitrite, UA pos    Leukocytes, UA Moderate (2+) (A) Negative   Appearance     Odor    Fe+TIBC+Fer     Status: Abnormal   Collection Time: 10/27/22  2:22 PM  Result Value Ref Range   Total Iron Binding Capacity 450 250 - 450 ug/dL   UIBC 846 (H) 962 - 952 ug/dL   Iron 18 (L) 27 - 841 ug/dL   Iron Saturation 4 (LL) 15 - 55 %   Ferritin 9 (L) 15 - 150 ng/mL  Urine Culture     Status: Abnormal   Collection Time: 10/27/22  2:25 PM   Specimen: Urine   UR  Result Value Ref Range   Urine Culture, Routine Final report (A)    Organism ID, Bacteria Comment (A)     Comment: Escherichia coli, identified by an automated biochemical system. Cefazolin <=4 ug/mL Cefazolin with an MIC <=16 predicts susceptibility to the oral agents cefaclor, cefdinir, cefpodoxime, cefprozil, cefuroxime, cephalexin, and loracarbef when used for therapy of uncomplicated urinary tract infections due to E. coli, Klebsiella pneumoniae, and Proteus mirabilis. 50,000-100,000 colony forming units per mL    Antimicrobial Susceptibility Comment     Comment:       ** S = Susceptible; I = Intermediate; R = Resistant **                    P = Positive; N = Negative             MICS are expressed in micrograms per mL    Antibiotic                 RSLT#1    RSLT#2    RSLT#3    RSLT#4 Amoxicillin/Clavulanic Acid    S Ampicillin                     R Cefepime                       S Ceftriaxone                    S Cefuroxime                     S Ciprofloxacin                  R Ertapenem  S Gentamicin                     S Imipenem                       S Levofloxacin                   R Meropenem                      S Nitrofurantoin                 R Piperacillin/Tazobactam        S Tetracycline                    S Tobramycin                     S Trimethoprim/Sulfa             S     RADIOGRAPHIC STUDIES: I have personally reviewed the radiological images as listed and agreed with the findings in the report. No results found.  ASSESSMENT:  1.  Severe iron deficiency anemia: - Patient seen at the request of Corlis Leak, FNP - 10/27/2022: Ferritin 9, saturation 4, Hb-9 - Reports weakness for the last 1 month.  Denies BRBPR/melena.  No ice pica.  History of transfusions with 3 back surgeries. - Started on iron tablet daily 1 week ago.  2.  Social/family history: - Lives by herself at home and also takes care of her brother.  She is able to do all her ADLs and also mows her [place].5 acres land.  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  Non-smoker.  No chemical exposure. - 4 brothers had lung cancer.  1 of those brothers also had leukemia.  1 sister had pancreatic cancer.  Niece and nephew had cancer.  PLAN:  1.  Severe iron deficiency anemia: - She is symptomatic with significant weakness. - Recommend parenteral iron therapy with Feraheme x 2.  No premedications needed. - We discussed side effects including rare chance of anaphylactic reaction.  She had history of penicillin allergy causing itching and rash. - Recommend RTC in 8 weeks with repeat CBC, ferritin and iron panel.  Will also check for other deficiencies including B12, folic acid, MMA, copper, SPEP and serum free light chains.   All questions were answered. The patient knows to call the clinic with any problems, questions or concerns.     Doreatha Massed, MD 11/04/22 5:31 PM

## 2022-11-06 ENCOUNTER — Inpatient Hospital Stay: Payer: Medicare Other

## 2022-11-06 VITALS — BP 115/63 | HR 75 | Temp 97.9°F | Resp 18

## 2022-11-06 DIAGNOSIS — R531 Weakness: Secondary | ICD-10-CM | POA: Diagnosis not present

## 2022-11-06 DIAGNOSIS — D508 Other iron deficiency anemias: Secondary | ICD-10-CM

## 2022-11-06 DIAGNOSIS — D509 Iron deficiency anemia, unspecified: Secondary | ICD-10-CM | POA: Diagnosis not present

## 2022-11-06 DIAGNOSIS — Z801 Family history of malignant neoplasm of trachea, bronchus and lung: Secondary | ICD-10-CM | POA: Diagnosis not present

## 2022-11-06 MED ORDER — SODIUM CHLORIDE 0.9 % IV SOLN
510.0000 mg | Freq: Once | INTRAVENOUS | Status: AC
  Administered 2022-11-06: 510 mg via INTRAVENOUS
  Filled 2022-11-06: qty 510

## 2022-11-06 MED ORDER — SODIUM CHLORIDE 0.9 % IV SOLN: Freq: Once | INTRAVENOUS | Status: AC

## 2022-11-06 NOTE — Patient Instructions (Signed)
[facility]  Discharge Instructions: Thank you for choosing [facility] to provide your oncology and hematology care.  If you have a lab appointment with the Cancer Center - please note that after April 8th, 2024, all labs will be drawn in the cancer center.  You do not have to check in or register with the main entrance as you have in the past but will complete your check-in in the cancer center.  Wear comfortable clothing and clothing appropriate for easy access to any Portacath or PICC line.   We strive to give you quality time with your provider. You may need to reschedule your appointment if you arrive late (15 or more minutes).  Arriving late affects you and other patients whose appointments are after yours.  Also, if you miss three or more appointments without notifying the office, you may be dismissed from the clinic at the provider's discretion.      For prescription refill requests, have your pharmacy contact our office and allow 72 hours for refills to be completed.    Today you received Feraheme IV iron infusion.   .  BELOW ARE SYMPTOMS THAT SHOULD BE REPORTED IMMEDIATELY: *FEVER GREATER THAN 100.4 F (38 C) OR HIGHER *CHILLS OR SWEATING *NAUSEA AND VOMITING THAT IS NOT CONTROLLED WITH YOUR NAUSEA MEDICATION *UNUSUAL SHORTNESS OF BREATH *UNUSUAL BRUISING OR BLEEDING *URINARY PROBLEMS (pain or burning when urinating, or frequent urination) *BOWEL PROBLEMS (unusual diarrhea, constipation, pain near the anus) TENDERNESS IN MOUTH AND THROAT WITH OR WITHOUT PRESENCE OF ULCERS (sore throat, sores in mouth, or a toothache) UNUSUAL RASH, SWELLING OR PAIN  UNUSUAL VAGINAL DISCHARGE OR ITCHING   Items with * indicate a potential emergency and should be followed up as soon as possible or go to the Emergency Department if any problems should occur.  Please show the CHEMOTHERAPY ALERT CARD or IMMUNOTHERAPY ALERT CARD at check-in to the Emergency  Department and triage nurse.  Should you have questions after your visit or need to cancel or reschedule your appointment, please contact [facility] [phone]  and follow the prompts.  Office hours are 8:00 a.m. to 4:30 p.m. Monday - Friday. Please note that voicemails left after 4:00 p.m. may not be returned until the following business day.  We are closed weekends and major holidays. You have access to a nurse at all times for urgent questions. Please call the main number to the clinic [phone] and follow the prompts.  For any non-urgent questions, you may also contact your provider using MyChart. We now offer e-Visits for anyone 18 and older to request care online for non-urgent symptoms. For details visit [email or web address].   Also download the MyChart app! Go to the app store, search "MyChart", open the app, select [facility], and log in with your MyChart username and password.   

## 2022-11-06 NOTE — Progress Notes (Signed)
Patient presents today for iron infusion.  Patient is in satisfactory condition with no new complaints voiced.  Vital signs are stable.  We will proceed with infusion per provider orders.    Peripheral IV started with good blood return pre and post infusion.  Feraheme 510 mg  given today per MD orders. Tolerated infusion without adverse affects. Vital signs stable. No complaints at this time. Discharged from clinic ambulatory in stable condition. Alert and oriented x 3. F/U with [facility] as scheduled.   

## 2022-11-10 NOTE — Progress Notes (Unsigned)
Established Patient Office Visit  Subjective:  Patient ID: Gloria Owens, female    DOB: 1936-01-29  Age: 87 y.o. MRN: [id number]  CC: No chief complaint on file.   HPI Gloria Owens is a 87 y.o. female with past medical history of *** presents for f/u of *** chronic medical conditions.  Past Medical History:  Diagnosis Date   Hypercholesteremia    Seizures (HCC)    with eye surgery several years ago-unknown etiology-no meds    Past Surgical History:  Procedure Laterality Date   ABDOMINAL HYSTERECTOMY     total hysterectomy   BACK SURGERY     fusion of back x2 and 1 other back surgery   CHOLECYSTECTOMY     COLON RESECTION     from endometriosis   CYSTOSCOPY     fulgeration of tumors   HERNIA REPAIR Right    INGUINAL HERNIA REPAIR Left 06/20/2013   Procedure: HERNIA REPAIR INGUINAL ADULT;  Surgeon: Dalia Heading, MD;  Location: [facility];  Service: General;  Laterality: Left;   INSERTION OF MESH Left 06/20/2013   Procedure: INSERTION OF MESH;  Surgeon: Dalia Heading, MD;  Location: [facility];  Service: General;  Laterality: Left;    No family history on file.  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  Tobacco Use   Smoking status: Never   Smokeless tobacco: Never  Vaping Use   Vaping Use: Never used  Substance and Sexual Activity   Alcohol use: No   Drug use: No   Sexual activity: Not Currently    Birth control/protection: Surgical  Other Topics Concern   Not on file  Social History Narrative   No children- had endometriosis an dhysterectomy; husband recently deceased   Nephew is Annaelle Vallone- security guard at [employer]   Social Determinants of [employer]: Low Risk  (11/14/2020)   Overall  Financial Resource Strain (CARDIA)    Difficulty of Paying Living Expenses: Not hard at all  Food Insecurity: No Food Insecurity (01/24/2021)   Hunger Vital Sign    Worried About Running Out of Food in the Last Year: Never true    Ran Out of Food in the Last Year: Never true  Transportation Needs: No Transportation Needs (01/24/2021)   PRAPARE - Administrator, Civil Service (Medical): No    Lack of Transportation (Non-Medical): No  Physical Activity: Sufficiently Active (11/14/2020)   Exercise Vital Sign    Days of Exercise per Week: 7 days    Minutes of Exercise per Session: 30 min  Stress: No Stress Concern Present (11/14/2020)   [employer] of Occupational Health - Occupational Stress Questionnaire    Feeling of Stress : Not at all  Social Connections: Moderately Isolated (11/14/2020)   Social Connection and Isolation Panel [NHANES]    Frequency of Communication with Friends and Family: More than three times a week    Frequency of Social Gatherings with Friends and Family: More than three times a week    Attends Religious Services: More than 4 times per year    Active Member of [employer] or Organizations: No    Attends Banker Meetings: Never  Marital Status: Widowed  Intimate Partner Violence: Not At Risk (11/14/2020)   Humiliation, Afraid, Rape, and Kick questionnaire    Fear of Current or Ex-Partner: No    Emotionally Abused: No    Physically Abused: No    Sexually Abused: No    Outpatient Medications Prior to Visit  Medication Sig Dispense Refill   aspirin EC 81 MG tablet Take 81 mg by mouth daily.     Calcium Carb-Cholecalciferol (CALTRATE 600+D3) 600-20 MG-MCG TABS Take 600 mg by mouth 2 (two) times daily. 60 tablet 4   cholecalciferol (VITAMIN D3) 25 MCG (1000 UNIT) tablet Take 2,000 Units by mouth daily.     feeding supplement, GLUCERNA SHAKE, (GLUCERNA SHAKE) LIQD Take 237 mLs by mouth 3 (three) times daily between meals. 30 mL 2   hydrOXYzine  (VISTARIL) 25 MG capsule Take 1 capsule (25 mg total) by mouth at bedtime. 30 capsule 0   Iron, Ferrous Sulfate, 325 (65 Fe) MG TABS Take 325 mg by mouth daily. 30 tablet 2   metFORMIN (GLUCOPHAGE) 500 MG tablet Take 1 tablet (500 mg total) by mouth 2 (two) times daily with a meal. 180 tablet 0   mirabegron ER (MYRBETRIQ) 25 MG TB24 tablet Take 1 tablet (25 mg total) by mouth daily. 30 tablet 0   olmesartan (BENICAR) 20 MG tablet Take 1 tablet (20 mg total) by mouth daily. 90 tablet 1   Semaglutide (RYBELSUS) 3 MG TABS Take 1 tablet (3 mg total) by mouth daily. 30 tablet 2   simvastatin (ZOCOR) 40 MG tablet Take 1 tablet (40 mg total) by mouth daily. 90 tablet 1   sulfamethoxazole-trimethoprim (BACTRIM DS) 800-160 MG tablet Take 1 tablet by mouth 2 (two) times daily. 14 tablet 0   No facility-administered medications prior to visit.    Allergies  Allergen Reactions   Penicillins     ROS Review of Systems    Objective:    Physical Exam  There were no vitals taken for this visit. Wt Readings from Last 3 Encounters:  11/04/22 148 lb 1.6 oz (67.2 kg)  10/27/22 148 lb 0.6 oz (67.2 kg)  06/27/22 149 lb 1.9 oz (67.6 kg)    Lab Results  Component Value Date   TSH 2.680 10/21/2022   Lab Results  Component Value Date   WBC 5.0 10/21/2022   HGB 9.0 (L) 10/21/2022   HCT 29.5 (L) 10/21/2022   MCV 81 10/21/2022   PLT 152 10/21/2022   Lab Results  Component Value Date   NA 137 10/21/2022   K 4.5 10/21/2022   CO2 20 10/21/2022   GLUCOSE 156 (H) 10/21/2022   BUN 25 10/21/2022   CREATININE 0.87 10/21/2022   BILITOT 0.3 10/21/2022   ALKPHOS 60 10/21/2022   AST 21 10/21/2022   ALT 17 10/21/2022   PROT 6.4 10/21/2022   ALBUMIN 4.3 10/21/2022   CALCIUM 9.6 10/21/2022   EGFR 66 10/21/2022   Lab Results  Component Value Date   CHOL 143 10/21/2022   Lab Results  Component Value Date   HDL 68 10/21/2022   Lab Results  Component Value Date   LDLCALC 58 10/21/2022   Lab  Results  Component Value Date   TRIG 89 10/21/2022   Lab Results  Component Value Date   CHOLHDL 2.1 10/21/2022   Lab Results  Component Value Date   HGBA1C 7.2 (H) 10/21/2022      Assessment & Plan:  Recurrent cystitis    Follow-up: No follow-ups on file.  Alvira Monday, FNP

## 2022-11-11 ENCOUNTER — Encounter: Payer: Self-pay | Admitting: Family Medicine

## 2022-11-11 ENCOUNTER — Ambulatory Visit (INDEPENDENT_AMBULATORY_CARE_PROVIDER_SITE_OTHER): Payer: Medicare Other | Admitting: Family Medicine

## 2022-11-11 VITALS — BP 132/80 | HR 83 | Ht 64.5 in | Wt 146.1 lb

## 2022-11-11 DIAGNOSIS — R1032 Left lower quadrant pain: Secondary | ICD-10-CM | POA: Diagnosis not present

## 2022-11-11 DIAGNOSIS — N3090 Cystitis, unspecified without hematuria: Secondary | ICD-10-CM | POA: Diagnosis not present

## 2022-11-11 LAB — POCT URINALYSIS DIPSTICK
Bilirubin, UA: NEGATIVE
Glucose, UA: NEGATIVE
Ketones, UA: NEGATIVE
Leukocytes, UA: NEGATIVE
Nitrite, UA: NEGATIVE
Protein, UA: NEGATIVE
Spec Grav, UA: 1.02 (ref 1.010–1.025)
Urobilinogen, UA: 0.2 E.U./dL
pH, UA: 5.5 (ref 5.0–8.0)

## 2022-11-11 NOTE — Assessment & Plan Note (Signed)
Repeat UA is clear today for cystitis Encouraged to  follow-up with urology as scheduled

## 2022-11-11 NOTE — Assessment & Plan Note (Signed)
Chronic condition for more than a year No history of diverticulitis She reports increased pain in her left lower quadrant when her bladder is full Pain is relieved when she empties her bladder The patient is following up with her urologist on Thursday, 11/13/2022 She noted that her urologist has been mid aware of her symptoms She denies any changes in her bowel and bladder, no reports of nausea, fever or pain radiating to her lower back UA is negative for leukocytes and nitrates Encouraged to increase her fluid intake to at least 64 ounces daily Will get a CT scan of the abdomen pelvis Encouraged to continue following up with her urologist on Thursday, 11/13/2022

## 2022-11-11 NOTE — Patient Instructions (Addendum)
I appreciate the opportunity to provide care to you today!    Follow up:  3 months   Continue to apply Neosporin to your upper lip, is healing appropriately  I recommend following up with urology as schedule and continue to drink at least 64 ounces of water daily    Please continue to a heart-healthy diet and increase your physical activities. Try to exercise for at least five days a week.      It was a pleasure to see you and I look forward to continuing to work together on your health and well-being. Please do not hesitate to call the office if you need care or have questions about your care.   Have a wonderful day and week. With Gratitude, Gilmore Laroche MSN, FNP-BC

## 2022-11-12 DIAGNOSIS — E119 Type 2 diabetes mellitus without complications: Secondary | ICD-10-CM | POA: Diagnosis not present

## 2022-11-13 LAB — HM DIABETES EYE EXAM

## 2022-11-14 ENCOUNTER — Inpatient Hospital Stay: Payer: Medicare Other

## 2022-11-14 VITALS — BP 134/62 | HR 86 | Temp 99.7°F | Resp 18

## 2022-11-14 DIAGNOSIS — D508 Other iron deficiency anemias: Secondary | ICD-10-CM

## 2022-11-14 DIAGNOSIS — R531 Weakness: Secondary | ICD-10-CM | POA: Diagnosis not present

## 2022-11-14 DIAGNOSIS — Z801 Family history of malignant neoplasm of trachea, bronchus and lung: Secondary | ICD-10-CM | POA: Diagnosis not present

## 2022-11-14 DIAGNOSIS — D509 Iron deficiency anemia, unspecified: Secondary | ICD-10-CM | POA: Diagnosis not present

## 2022-11-14 MED ORDER — SODIUM CHLORIDE 0.9 % IV SOLN
510.0000 mg | Freq: Once | INTRAVENOUS | Status: AC
  Administered 2022-11-14: 510 mg via INTRAVENOUS
  Filled 2022-11-14: qty 510

## 2022-11-14 MED ORDER — SODIUM CHLORIDE 0.9 % IV SOLN: Freq: Once | INTRAVENOUS | Status: AC

## 2022-11-14 NOTE — Progress Notes (Signed)
Patient presents today for Feraheme infusion per providers order.  Vital signs WNL.  Patient has no new complaints at this time.  Peripheral IV started and blood return noted pre and post infusion.  Stable during infusion without adverse affects.  Vital signs stable.  No complaints at this time.  Discharge from clinic ambulatory in stable condition.  Alert and oriented X 3.  Follow up with [facility] as scheduled.  

## 2022-11-14 NOTE — Patient Instructions (Signed)
[facility]  Discharge Instructions: Thank you for choosing [facility] to provide your oncology and hematology care.  If you have a lab appointment with the Cancer Center - please note that after April 8th, 2024, all labs will be drawn in the cancer center.  You do not have to check in or register with the main entrance as you have in the past but will complete your check-in in the cancer center.  Wear comfortable clothing and clothing appropriate for easy access to any Portacath or PICC line.   We strive to give you quality time with your provider. You may need to reschedule your appointment if you arrive late (15 or more minutes).  Arriving late affects you and other patients whose appointments are after yours.  Also, if you miss three or more appointments without notifying the office, you may be dismissed from the clinic at the provider's discretion.      For prescription refill requests, have your pharmacy contact our office and allow 72 hours for refills to be completed.    Today you received the following chemotherapy and/or immunotherapy agents Feraheme      To help prevent nausea and vomiting after your treatment, we encourage you to take your nausea medication as directed.  BELOW ARE SYMPTOMS THAT SHOULD BE REPORTED IMMEDIATELY: *FEVER GREATER THAN 100.4 F (38 C) OR HIGHER *CHILLS OR SWEATING *NAUSEA AND VOMITING THAT IS NOT CONTROLLED WITH YOUR NAUSEA MEDICATION *UNUSUAL SHORTNESS OF BREATH *UNUSUAL BRUISING OR BLEEDING *URINARY PROBLEMS (pain or burning when urinating, or frequent urination) *BOWEL PROBLEMS (unusual diarrhea, constipation, pain near the anus) TENDERNESS IN MOUTH AND THROAT WITH OR WITHOUT PRESENCE OF ULCERS (sore throat, sores in mouth, or a toothache) UNUSUAL RASH, SWELLING OR PAIN  UNUSUAL VAGINAL DISCHARGE OR ITCHING   Items with * indicate a potential emergency and should be followed up as soon as possible or go to the  Emergency Department if any problems should occur.  Please show the CHEMOTHERAPY ALERT CARD or IMMUNOTHERAPY ALERT CARD at check-in to the Emergency Department and triage nurse.  Should you have questions after your visit or need to cancel or reschedule your appointment, please contact [facility] AT  [phone]  and follow the prompts.  Office hours are 8:00 a.m. to 4:30 p.m. Monday - Friday. Please note that voicemails left after 4:00 p.m. may not be returned until the following business day.  We are closed weekends and major holidays. You have access to a nurse at all times for urgent questions. Please call the main number to the clinic [phone] and follow the prompts.  For any non-urgent questions, you may also contact your provider using MyChart. We now offer e-Visits for anyone 18 and older to request care online for non-urgent symptoms. For details visit [email or web address].   Also download the MyChart app! Go to the app store, search "MyChart", open the app, select [facility], and log in with your MyChart username and password.   

## 2022-11-17 ENCOUNTER — Ambulatory Visit: Payer: Medicare Other | Admitting: Urology

## 2022-11-17 VITALS — BP 137/67 | HR 82

## 2022-11-17 DIAGNOSIS — Z87440 Personal history of urinary (tract) infections: Secondary | ICD-10-CM

## 2022-11-17 LAB — MICROSCOPIC EXAMINATION: WBC, UA: >30 /hpf — AB (ref 0–5)

## 2022-11-17 LAB — URINALYSIS, ROUTINE W REFLEX MICROSCOPIC
Bilirubin, UA: NEGATIVE
Glucose, UA: NEGATIVE
Ketones, UA: NEGATIVE
Nitrite, UA: POSITIVE — AB
Protein,UA: NEGATIVE
Specific Gravity, UA: 1.01 (ref 1.005–1.030)
Urobilinogen, Ur: 0.2 mg/dL (ref 0.2–1.0)
pH, UA: 6 (ref 5.0–7.5)

## 2022-11-17 MED ORDER — SULFAMETHOXAZOLE-TRIMETHOPRIM 800-160 MG PO TABS
1.0000 | ORAL_TABLET | Freq: Two times a day (BID) | ORAL | 5 refills | Status: DC
Start: 2022-11-17 — End: 2023-02-11

## 2022-11-17 MED ORDER — NITROFURANTOIN MACROCRYSTAL 50 MG PO CAPS
50.0000 mg | ORAL_CAPSULE | Freq: Every day | ORAL | 11 refills | Status: DC
Start: 2022-11-17 — End: 2023-02-11

## 2022-11-17 NOTE — Progress Notes (Signed)
11/17/2022 2:45 PM   Gloria Owens 1936/02/12 [id number]  Referring provider: Gilmore Laroche, FNP [address] [place],  [place] [address]  Followup recurrent UTI   HPI: Gloria Owens is a 87yo here for followup for recurent UTI. She has had 2 UTIs since last visit. She is on macrodantin 50mg  at bedtime and bactrim self start when she has UTI symptoms. She denies ant worsening LUTS. No hematuria or dysuria. No other complaints today   PMH: Past Medical History:  Diagnosis Date   Hypercholesteremia    Seizures (HCC)    with eye surgery several years ago-unknown etiology-no meds    Surgical History: Past Surgical History:  Procedure Laterality Date   ABDOMINAL HYSTERECTOMY     total hysterectomy   BACK SURGERY     fusion of back x2 and 1 other back surgery   CHOLECYSTECTOMY     COLON RESECTION     from endometriosis   CYSTOSCOPY     fulgeration of tumors   HERNIA REPAIR Right    INGUINAL HERNIA REPAIR Left 06/20/2013   Procedure: HERNIA REPAIR INGUINAL ADULT;  Surgeon: Dalia Heading, MD;  Location: [facility];  Service: General;  Laterality: Left;   INSERTION OF MESH Left 06/20/2013   Procedure: INSERTION OF MESH;  Surgeon: Dalia Heading, MD;  Location: [facility];  Service: General;  Laterality: Left;    Home Medications:  Allergies as of 11/17/2022       Reactions   Penicillins         Medication List        Accurate as of November 17, 2022  2:45 PM. If you have any questions, ask your nurse or doctor.          aspirin EC 81 MG tablet Take 81 mg by mouth daily.   Calcium Carb-Cholecalciferol 600-20 MG-MCG Tabs Commonly known as: Caltrate 600+D3 Take 600 mg by mouth 2 (two) times daily.   cholecalciferol 25 MCG (1000 UNIT) tablet Commonly known as: VITAMIN D3 Take 2,000 Units by mouth daily.   feeding supplement (GLUCERNA SHAKE) Liqd Take 237 mLs by mouth 3 (three) times daily between meals.   hydrOXYzine 25 MG capsule Commonly known as: VISTARIL Take  1 capsule (25 mg total) by mouth at bedtime.   Iron (Ferrous Sulfate) 325 (65 Fe) MG Tabs Take 325 mg by mouth daily.   metFORMIN 500 MG tablet Commonly known as: GLUCOPHAGE Take 1 tablet (500 mg total) by mouth 2 (two) times daily with a meal.   mirabegron ER 25 MG Tb24 tablet Commonly known as: MYRBETRIQ Take 1 tablet (25 mg total) by mouth daily.   olmesartan 20 MG tablet Commonly known as: BENICAR Take 1 tablet (20 mg total) by mouth daily.   Rybelsus 3 MG Tabs Generic drug: Semaglutide Take 1 tablet (3 mg total) by mouth daily.   simvastatin 40 MG tablet Commonly known as: ZOCOR Take 1 tablet (40 mg total) by mouth daily.   sulfamethoxazole-trimethoprim 800-160 MG tablet Commonly known as: BACTRIM DS Take 1 tablet by mouth 2 (two) times daily.        Allergies:  Allergies  Allergen Reactions   Penicillins     Family History: No family history on file.  Social History:  reports that she has never smoked. She has never used smokeless tobacco. She reports that she does not drink alcohol and does not use drugs.  ROS: All other review of systems were reviewed and are negative except  what is noted above in HPI  Physical Exam: BP 137/67   Pulse 82   Constitutional:  Alert and oriented, No acute distress. HEENT: [place] AT, moist mucus membranes.  Trachea midline, no masses. Cardiovascular: No clubbing, cyanosis, or edema. Respiratory: Normal respiratory effort, no increased work of breathing. GI: Abdomen is soft, nontender, nondistended, no abdominal masses GU: No CVA tenderness.  Lymph: No cervical or inguinal lymphadenopathy. Skin: No rashes, bruises or suspicious lesions. Neurologic: Grossly intact, no focal deficits, moving all 4 extremities. Psychiatric: Normal mood and affect.  Laboratory Data: Lab Results  Component Value Date   WBC 5.0 10/21/2022   HGB 9.0 (L) 10/21/2022   HCT 29.5 (L) 10/21/2022   MCV 81 10/21/2022   PLT 152 10/21/2022    Lab  Results  Component Value Date   CREATININE 0.87 10/21/2022    No results found for: "PSA"  No results found for: "TESTOSTERONE"  Lab Results  Component Value Date   HGBA1C 7.2 (H) 10/21/2022    Urinalysis    Component Value Date/Time   APPEARANCEUR Clear 08/01/2022 1212   GLUCOSEU Negative 08/01/2022 1212   BILIRUBINUR neg 11/11/2022 1503   BILIRUBINUR Negative 08/01/2022 1212   KETONESUR negative 04/21/2022 1545   PROTEINUR Negative 11/11/2022 1503   PROTEINUR Negative 08/01/2022 1212   UROBILINOGEN 0.2 11/11/2022 1503   NITRITE neg 11/11/2022 1503   NITRITE Negative 08/01/2022 1212   LEUKOCYTESUR Negative 11/11/2022 1503   LEUKOCYTESUR 1+ (A) 08/01/2022 1212    Lab Results  Component Value Date   LABMICR 7.9 10/21/2022   WBCUA 11-30 (A) 08/01/2022   LABEPIT 0-10 08/01/2022   BACTERIA Moderate (A) 08/01/2022    Pertinent Imaging: Results for orders placed during the hospital encounter of 05/07/06  DG Abd 1 View  Narrative Clinical Data:  Postop. Abdominal symptoms. ABDOMEN - 1 VIEW: Findings: Mild gaseous distention of multiple small bowel loops compatible with nonspecific generalized ileus. Fairly generous amount of stool in the colon.  Transpedicle screws and interbody spacers at L4-5. The patient has posterolateral bony fusion as well.  Impression Query mild constipation. Nonspecific ileus.  Provider: Vickie Epley  No results found for this or any previous visit.  No results found for this or any previous visit.  No results found for this or any previous visit.  No results found for this or any previous visit.  No valid procedures specified. No results found for this or any previous visit.  No results found for this or any previous visit.   Assessment & Plan:    1. History of UTI -continue macrodantin prophylaxis -self start bactrim  - Urinalysis, Routine w reflex microscopic - Urine Culture   No follow-ups on file.  Wilkie Aye,  MD  [facility] [place]

## 2022-11-21 LAB — URINE CULTURE

## 2022-11-23 ENCOUNTER — Encounter: Payer: Self-pay | Admitting: Urology

## 2022-11-23 NOTE — Patient Instructions (Signed)
Urinary Tract Infection, Adult  A urinary tract infection (UTI) is an infection of any part of the urinary tract. The urinary tract includes the kidneys, ureters, bladder, and urethra. These organs make, store, and get rid of urine in the body. An upper UTI affects the ureters and kidneys. A lower UTI affects the bladder and urethra. What are the causes? Most urinary tract infections are caused by bacteria in your genital area around your urethra, where urine leaves your body. These bacteria grow and cause inflammation of your urinary tract. What increases the risk? You are more likely to develop this condition if: You have a urinary catheter that stays in place. You are not able to control when you urinate or have a bowel movement (incontinence). You are female and you: Use a spermicide or diaphragm for birth control. Have low estrogen levels. Are pregnant. You have certain genes that increase your risk. You are sexually active. You take antibiotic medicines. You have a condition that causes your flow of urine to slow down, such as: An enlarged prostate, if you are female. Blockage in your urethra. A kidney stone. A nerve condition that affects your bladder control (neurogenic bladder). Not getting enough to drink, or not urinating often. You have certain medical conditions, such as: Diabetes. A weak disease-fighting system (immunesystem). Sickle cell disease. Gout. Spinal cord injury. What are the signs or symptoms? Symptoms of this condition include: Needing to urinate right away (urgency). Frequent urination. This may include small amounts of urine each time you urinate. Pain or burning with urination. Blood in the urine. Urine that smells bad or unusual. Trouble urinating. Cloudy urine. Vaginal discharge, if you are female. Pain in the abdomen or the lower back. You may also have: Vomiting or a decreased appetite. Confusion. Irritability or tiredness. A fever or  chills. Diarrhea. The first symptom in older adults may be confusion. In some cases, they may not have any symptoms until the infection has worsened. How is this diagnosed? This condition is diagnosed based on your medical history and a physical exam. You may also have other tests, including: Urine tests. Blood tests. Tests for STIs (sexually transmitted infections). If you have had more than one UTI, a cystoscopy or imaging studies may be done to determine the cause of the infections. How is this treated? Treatment for this condition includes: Antibiotic medicine. Over-the-counter medicines to treat discomfort. Drinking enough water to stay hydrated. If you have frequent infections or have other conditions such as a kidney stone, you may need to see a health care provider who specializes in the urinary tract (urologist). In rare cases, urinary tract infections can cause sepsis. Sepsis is a life-threatening condition that occurs when the body responds to an infection. Sepsis is treated in the hospital with IV antibiotics, fluids, and other medicines. Follow these instructions at home:  Medicines Take over-the-counter and prescription medicines only as told by your health care provider. If you were prescribed an antibiotic medicine, take it as told by your health care provider. Do not stop using the antibiotic even if you start to feel better. General instructions Make sure you: Empty your bladder often and completely. Do not hold urine for long periods of time. Empty your bladder after sex. Wipe from front to back after urinating or having a bowel movement if you are female. Use each tissue only one time when you wipe. Drink enough fluid to keep your urine pale yellow. Keep all follow-up visits. This is important. Contact a health   care provider if: Your symptoms do not get better after 1-2 days. Your symptoms go away and then return. Get help right away if: You have severe pain in  your back or your lower abdomen. You have a fever or chills. You have nausea or vomiting. Summary A urinary tract infection (UTI) is an infection of any part of the urinary tract, which includes the kidneys, ureters, bladder, and urethra. Most urinary tract infections are caused by bacteria in your genital area. Treatment for this condition often includes antibiotic medicines. If you were prescribed an antibiotic medicine, take it as told by your health care provider. Do not stop using the antibiotic even if you start to feel better. Keep all follow-up visits. This is important. This information is not intended to replace advice given to you by your health care provider. Make sure you discuss any questions you have with your health care provider. Document Revised: 12/18/2019 Document Reviewed: 12/23/2019 Elsevier Patient Education  2024 Elsevier Inc.  

## 2022-11-24 ENCOUNTER — Telehealth: Payer: Self-pay | Admitting: *Deleted

## 2022-11-24 ENCOUNTER — Ambulatory Visit: Payer: Medicare Other

## 2022-11-24 ENCOUNTER — Other Ambulatory Visit: Payer: Self-pay | Admitting: Nurse Practitioner

## 2022-11-24 VITALS — Ht 65.0 in | Wt 141.0 lb

## 2022-11-24 DIAGNOSIS — Z789 Other specified health status: Secondary | ICD-10-CM

## 2022-11-24 NOTE — Progress Notes (Signed)
  Care Coordination   Note   11/24/2022 Name: DIANDRA HEDEEN MRN: [id number] DOB: 1935-11-14  EMAROSA KALT is a 87 y.o. year old female who sees Gilmore Laroche, FNP for primary care. I reached out to [name] by phone today to offer care coordination services.  Ms. Dasgupta was given information about Care Coordination services today including:   The Care Coordination services include support from the care team which includes your Nurse Coordinator, Clinical Social Worker, or Pharmacist.  The Care Coordination team is here to help remove barriers to the health concerns and goals most important to you. Care Coordination services are voluntary, and the patient may decline or stop services at any time by request to their care team member.   Care Coordination Consent Status: Patient agreed to services and verbal consent obtained.   Follow up plan:  Telephone appointment with care coordination team member scheduled for:  12/01/22  Encounter Outcome:  Pt. Scheduled  [facility] Coordination Care Guide  Direct Dial: [phone]

## 2022-11-24 NOTE — Progress Notes (Signed)
 Subjective:   Gloria Owens is a 87 y.o. female who presents for Medicare Annual (Subsequent) preventive examination.  Visit Complete: Virtual  I connected with  Gloria Owens on 11/24/22 by a audio enabled telemedicine application and verified that I am speaking with the correct person using two identifiers.  Patient Location: Home  Provider Location: Home Office  I discussed the limitations of evaluation and management by telemedicine. The patient expressed understanding and agreed to proceed.  Patient Medicare AWV questionnaire was completed by the patient on n/a; I have confirmed that all information answered by patient is correct and no changes since this date.  Review of Systems     Cardiac Risk Factors include: advanced age (>8men, >54 women);diabetes mellitus;dyslipidemia;hypertension     Objective:    Today's Vitals   11/24/22 0801  Weight: 141 lb (64 kg)  Height: 5\' 5"  (1.651 m)  PainSc: 8    Body mass index is 23.46 kg/m.     11/24/2022    8:01 AM 11/06/2022    2:35 PM 11/04/2022   12:55 PM 11/18/2021    8:07 AM 01/24/2021   11:57 AM 11/14/2020    8:38 AM 07/26/2020    1:42 PM  Advanced Directives  Does Patient Have a Medical Advance Directive? No No No Yes No No No  Type of Theme park manager;Living will     Does patient want to make changes to medical advance directive?    No - Patient declined     Copy of Healthcare Power of Attorney in Chart?    No - copy requested     Would patient like information on creating a medical advance directive? No - Patient declined No - Patient declined No - Patient declined  No - Patient declined No - Patient declined No - Patient declined    Current Medications (verified) Outpatient Encounter Medications as of 11/24/2022  Medication Sig   cholecalciferol (VITAMIN D3) 25 MCG (1000 UNIT) tablet Take 2,000 Units by mouth daily.   feeding supplement, GLUCERNA SHAKE, (GLUCERNA SHAKE) LIQD Take 237  mLs by mouth 3 (three) times daily between meals.   metFORMIN (GLUCOPHAGE) 500 MG tablet Take 1 tablet (500 mg total) by mouth 2 (two) times daily with a meal.   mirabegron ER (MYRBETRIQ) 25 MG TB24 tablet Take 1 tablet (25 mg total) by mouth daily.   nitrofurantoin (MACRODANTIN) 50 MG capsule Take 1 capsule (50 mg total) by mouth at bedtime.   simvastatin (ZOCOR) 40 MG tablet Take 1 tablet (40 mg total) by mouth daily.   aspirin EC 81 MG tablet Take 81 mg by mouth daily.   Calcium Carb-Cholecalciferol (CALTRATE 600+D3) 600-20 MG-MCG TABS Take 600 mg by mouth 2 (two) times daily.   hydrOXYzine (VISTARIL) 25 MG capsule Take 1 capsule (25 mg total) by mouth at bedtime. (Patient not taking: Reported on 11/24/2022)   Iron, Ferrous Sulfate, 325 (65 Fe) MG TABS Take 325 mg by mouth daily. (Patient not taking: Reported on 11/24/2022)   olmesartan (BENICAR) 20 MG tablet Take 1 tablet (20 mg total) by mouth daily. (Patient not taking: Reported on 11/24/2022)   Semaglutide (RYBELSUS) 3 MG TABS Take 1 tablet (3 mg total) by mouth daily. (Patient not taking: Reported on 11/24/2022)   sulfamethoxazole-trimethoprim (BACTRIM DS) 800-160 MG tablet Take 1 tablet by mouth 2 (two) times daily. (Patient not taking: Reported on 11/24/2022)   sulfamethoxazole-trimethoprim (BACTRIM DS) 800-160 MG tablet Take 1 tablet by  mouth every 12 (twelve) hours. (Patient not taking: Reported on 11/24/2022)   No facility-administered encounter medications on file as of 11/24/2022.    Allergies (verified) Penicillins   History: Past Medical History:  Diagnosis Date   Hypercholesteremia    Seizures (HCC)    with eye surgery several years ago-unknown etiology-no meds   Past Surgical History:  Procedure Laterality Date   ABDOMINAL HYSTERECTOMY     total hysterectomy   BACK SURGERY     fusion of back x2 and 1 other back surgery   CHOLECYSTECTOMY     COLON RESECTION     from endometriosis   CYSTOSCOPY     fulgeration of tumors    HERNIA REPAIR Right    INGUINAL HERNIA REPAIR Left 06/20/2013   Procedure: HERNIA REPAIR INGUINAL ADULT;  Surgeon: Dalia Heading, MD;  Location: [facility];  Service: General;  Laterality: Left;   INSERTION OF MESH Left 06/20/2013   Procedure: INSERTION OF MESH;  Surgeon: Dalia Heading, MD;  Location: [facility];  Service: General;  Laterality: Left;   History reviewed. No pertinent family history. 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  Tobacco Use   Smoking status: Never   Smokeless tobacco: Never  Vaping Use   Vaping Use: Never used  Substance and Sexual Activity   Alcohol use: No   Drug use: No   Sexual activity: Not Currently    Birth control/protection: Surgical  Other Topics Concern   Not on file  Social History Narrative   No children- had endometriosis an dhysterectomy; husband recently deceased   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 Owens- security guard at [employer]      11/24/22 patient caring for 1 brother who has Alzheimer and another brother is in a facility   Social Determinants of Health   Financial Resource Strain: Low Risk  (11/24/2022)   Overall Financial Resource Strain (CARDIA)    Difficulty of Paying Living Expenses: Not hard at all  Food Insecurity: No Food Insecurity (11/24/2022)   Hunger Vital Sign    Worried About Running Out of Food in the Last Year: Never true    Ran Out of Food in the Last Year: Never true  Transportation Needs: No Transportation Needs (11/24/2022)   PRAPARE - Administrator, Civil Service (Medical): No    Lack of Transportation (Non-Medical): No  Physical Activity: Sufficiently Active (11/24/2022)   Exercise Vital Sign    Days of Exercise per Week: 7 days    Minutes of Exercise per Session: 30 min   Stress: No Stress Concern Present (11/24/2022)   [employer] of Occupational Health - Occupational Stress Questionnaire    Feeling of Stress : Not at all  Social Connections: Moderately Integrated (11/24/2022)   Social Connection and Isolation Panel [NHANES]    Frequency of Communication with Friends and Family: More than three times a week    Frequency of Social Gatherings with Friends and Family: More than three times a week    Attends Religious Services: More than 4 times per year    Active Member of [employer] or Organizations: No    Attends Banker Meetings: Never    Marital Status: Married  Tobacco Counseling Counseling given: Yes   Clinical Intake:  Pre-visit preparation completed: Yes  Pain : 0-10 Pain Score: 8  Pain Type: Chronic pain Pain Location: Rib cage (Left side and "colon") Pain Orientation: Left Pain Descriptors / Indicators: Constant Pain Onset: More than a month ago Pain Frequency: Constant     BMI - recorded: 23.46 Nutritional Status: BMI of 19-24  Normal Nutritional Risks: None Diabetes: Yes CBG done?: No Did pt. bring in CBG monitor from home?: No  How often do you need to have someone help you when you read instructions, pamphlets, or other written materials from your doctor or pharmacy?: 1 - Never  Interpreter Needed?: No  Information entered by ::  Sabrena Gavitt, CMA   Activities of Daily Living    11/24/2022    8:11 AM  In your present state of health, do you have any difficulty performing the following activities:  Hearing? 0  Vision? 0  Difficulty concentrating or making decisions? 0  Walking or climbing stairs? 1  Comment uses cane  Dressing or bathing? 0  Doing errands, shopping? 0  Preparing Food and eating ? N  Using the Toilet? N  In the past six months, have you accidently leaked urine? Y  Do you have problems with loss of bowel control? N  Managing your Medications? N  Managing your Finances? N  Housekeeping  or managing your Housekeeping? N    Patient Care Team: Gilmore Laroche, FNP as PCP - General (Family Medicine)  Indicate any recent Medical Services you may have received from other than [facility] providers in the past year (date may be approximate).     Assessment:   This is a routine wellness examination for Gloria Owens.  Hearing/Vision screen Hearing Screening - Comments:: Patient denies any hearing difficulties.    Dietary issues and exercise activities discussed:     Goals Addressed             This Visit's Progress    DIET - INCREASE WATER INTAKE   On track    Exercise 3x per week (30 min per time)   On track    Patient states she wants to start walk        Depression Screen    11/24/2022    8:11 AM 11/11/2022    2:26 PM 10/27/2022    1:13 PM 06/27/2022    1:36 PM 04/21/2022    2:46 PM 03/21/2022    2:03 PM 11/18/2021    8:08 AM  PHQ 2/9 Scores  PHQ - 2 Score 0 0 0 0 2 0 0  PHQ- 9 Score 0 0 2 0 11      Fall Risk    11/24/2022    8:11 AM 11/11/2022    2:25 PM 10/27/2022    1:13 PM 06/27/2022    1:35 PM 04/21/2022    2:46 PM  Fall Risk   Falls in the past year? 0 0 0 0 0  Number falls in past yr: 0 0 0 0 0  Injury with Fall? 0 0 0 0 0  Risk for fall due to : No Fall Risks No Fall Risks No Fall Risks No Fall Risks   Follow up Falls prevention discussed Falls evaluation completed Falls evaluation completed Falls evaluation completed     MEDICARE RISK AT HOME:  Medicare Risk at Home - 11/24/22 0804     Any stairs in or around the home? No    If so, are there any without handrails?  No    Home free of loose throw rugs in walkways, pet beds, electrical cords, etc? Yes    Adequate lighting in your home to reduce risk of falls? Yes    Life alert? No    Use of a cane, walker or w/c? Yes    Grab bars in the bathroom? Yes    Shower chair or bench in shower? Yes    Elevated toilet seat or a handicapped toilet? Yes             TIMED UP AND GO:  Was the test performed?   No    Cognitive Function:        11/24/2022    8:05 AM 11/18/2021    8:12 AM  6CIT Screen  What Year? 0 points 0 points  What month? 0 points 0 points  What time? 0 points 0 points  Count back from 20 0 points 0 points  Months in reverse 0 points 0 points  Repeat phrase 0 points 10 points  Total Score 0 points 10 points    Immunizations Immunization History  Administered Date(s) Administered   Moderna Sars-Covid-2 Vaccination 07/29/2019, 08/30/2019    TDAP status: Due, Education has been provided regarding the importance of this vaccine. Advised may receive this vaccine at local pharmacy or Health Dept. Aware to provide a copy of the vaccination record if obtained from local pharmacy or Health Dept. Verbalized acceptance and understanding.  Flu Vaccine status: Up to date  Pneumococcal vaccine status: Up to date  Covid-19 vaccine status: Information provided on how to obtain vaccines.   Qualifies for Shingles Vaccine? Yes   Zostavax completed No   Shingrix Completed?: Yes  Screening Tests Health Maintenance  Topic Date Due   DTaP/Tdap/Td (1 - Tdap) Never done   Zoster Vaccines- Shingrix (1 of 2) Never done   OPHTHALMOLOGY EXAM  06/24/2022   Medicare Annual Wellness (AWV)  11/19/2022   INFLUENZA VACCINE  12/25/2022   HEMOGLOBIN A1C  04/23/2023   FOOT EXAM  06/28/2023   Diabetic kidney evaluation - eGFR measurement  10/21/2023   Diabetic kidney evaluation - Urine ACR  10/21/2023   DEXA SCAN  Completed   HPV VACCINES  Aged Out   Pneumonia Vaccine 95+ Years old  Discontinued   COVID-19 Vaccine  Discontinued    Health Maintenance  Health Maintenance Due  Topic Date Due   DTaP/Tdap/Td (1 - Tdap) Never done   Zoster Vaccines- Shingrix (1 of 2) Never done   OPHTHALMOLOGY EXAM  06/24/2022   Medicare Annual Wellness (AWV)  11/19/2022    Colorectal cancer screening: No longer required.   Mammogram status: No longer required due to age.  Bone Density status:  Completed 07/08/2021. Results reflect: Bone density results: OSTEOPOROSIS. Repeat every 2 years.  Lung Cancer Screening: (Low Dose CT Chest recommended if Age 85-80 years, 20 pack-year currently smoking OR have quit w/in 15years.) does not qualify.   Lung Cancer Screening Referral: na  Additional Screening:  Hepatitis C Screening: does not qualify; no done  Vision Screening: Recommended annual ophthalmology exams for early detection of glaucoma and other disorders of the eye. Is the patient up to date with their annual eye exam?  Yes  Who is the provider or what is the name of the office in which the patient attends annual eye exams? Dr. Charise Killian, [facility]  Dental Screening: Recommended annual dental exams for proper oral hygiene  Diabetic Foot Exam: Diabetic Foot Exam: Completed 06/27/2022  Community  Resource Referral / Chronic Care Management: CRR required this visit?  Yes   CCM required this visit?  No     Plan:     I have personally reviewed and noted the following in the patient's chart:   Medical and social history Use of alcohol, tobacco or illicit drugs  Current medications and supplements including opioid prescriptions. Patient is not currently taking opioid prescriptions. Functional ability and status Nutritional status Physical activity Advanced directives List of other physicians Hospitalizations, surgeries, and ER visits in previous 12 months Vitals Screenings to include cognitive, depression, and falls Referrals and appointments  In addition, I have reviewed and discussed with patient certain preventive protocols, quality metrics, and best practice recommendations. A written personalized care plan for preventive services as well as general preventive health recommendations were provided to patient.   Any medications not marked as taking were not mentioned by the patient (or their caregiver if applicable) when reconciling the medications.  Because this visit  was a virtual/telehealth visit,  certain criteria was not obtained, such a blood pressure, CBG if patient is a diabetic, and timed up and go.    Jordan Hawks Mellina Benison, CMA   11/24/2022   After Visit Summary: (Mail) Due to this being a telephonic visit, the after visit summary with patients personalized plan was offered to patient via mail   Nurse Notes: CRR placed for a life alert device

## 2022-11-24 NOTE — Progress Notes (Signed)
  Care Coordination  Outreach Note  11/24/2022 Name: Gloria Owens MRN: [id number] DOB: July 13, 1935   Care Coordination Outreach Attempts: An unsuccessful telephone outreach was attempted today to offer the patient information about available care coordination services.  Follow Up Plan:  Additional outreach attempts will be made to offer the patient care coordination information and services.   Encounter Outcome:  No Answer  Christie Nottingham  Care Coordination Care Guide  Direct Dial: [phone]

## 2022-11-24 NOTE — Patient Instructions (Signed)
Gloria Owens , Thank you for taking time to come for your Medicare Wellness Visit. I appreciate your ongoing commitment to your health goals. Please review the following plan we discussed and let me know if I can assist you in the future.   These are the goals we discussed:  Goals      DIET - INCREASE WATER INTAKE     Exercise 3x per week (30 min per time)     Patient states she wants to start walk      HEMOGLOBIN A1C < 7     Patient Stated     Patient states that she currently does not have any goals set for herself.        This is a list of the screening recommended for you and due dates:  Health Maintenance  Topic Date Due   DTaP/Tdap/Td vaccine (1 - Tdap) Never done   Zoster (Shingles) Vaccine (1 of 2) Never done   Eye exam for diabetics  06/24/2022   Medicare Annual Wellness Visit  11/19/2022   Flu Shot  12/25/2022   Hemoglobin A1C  04/23/2023   Complete foot exam   06/28/2023   Yearly kidney function blood test for diabetes  10/21/2023   Yearly kidney health urinalysis for diabetes  10/21/2023   DEXA scan (bone density measurement)  Completed   HPV Vaccine  Aged Out   Pneumonia Vaccine  Discontinued   COVID-19 Vaccine  Discontinued    Advanced directives: Advance directive discussed with you today. Even though you declined this today, please call our office should you change your mind, and we can give you the proper paperwork for you to fill out. Advance care planning is a way to make decisions about medical care that fits your values in case you are ever unable to make these decisions for yourself.  Information on Advanced Care Planning can be found at [facility] [place] Advance Health Care Directives Advance Health Care Directives ([email or web address]    Conditions/risks identified: Aim for 30 minutes of exercise or brisk walking, 6-8 glasses of water, and 5 servings of fruits and vegetables each day.  You are due for the vaccines checked below. You may have  these done at your preferred pharmacy. Please have them fax the office proof of the vaccines so that we can update your chart.   [x]  Shingrix (Shingles vaccine) []  Pneumonia Vaccines [x]  TDAP (Tetanus) Vaccine []  Covid-19 []  Flu  Next appointment: VIRTUAL/TELEPHONE APPOINTMENT Follow up in one year for your annual wellness visit  December 30, 2023 at 8am telephone visit   Preventive Care 65 Years and Older, Female Preventive care refers to lifestyle choices and visits with your health care provider that can promote health and wellness. What does preventive care include? A yearly physical exam. This is also called an annual well check. Dental exams once or twice a year. Routine eye exams. Ask your health care provider how often you should have your eyes checked. Personal lifestyle choices, including: Daily care of your teeth and gums. Regular physical activity. Eating a healthy diet. Avoiding tobacco and drug use. Limiting alcohol use. Practicing safe sex. Taking low-dose aspirin every day. Taking vitamin and mineral supplements as recommended by your health care provider. What happens during an annual well check? The services and screenings done by your health care provider during your annual well check will depend on your age, overall health, lifestyle risk factors, and family history of disease. Counseling  Your health care provider  may ask you questions about your: Alcohol use. Tobacco use. Drug use. Emotional well-being. Home and relationship well-being. Sexual activity. Eating habits. History of falls. Memory and ability to understand (cognition). Work and work Astronomer. Reproductive health. Screening  You may have the following tests or measurements: Height, weight, and BMI. Blood pressure. Lipid and cholesterol levels. These may be checked every 5 years, or more frequently if you are over 104 years old. Skin check. Lung cancer screening. You may have this  screening every year starting at age 32 if you have a 30-pack-year history of smoking and currently smoke or have quit within the past 15 years. Fecal occult blood test (FOBT) of the stool. You may have this test every year starting at age 78. Flexible sigmoidoscopy or colonoscopy. You may have a sigmoidoscopy every 5 years or a colonoscopy every 10 years starting at age 64. Hepatitis C blood test. Hepatitis B blood test. Sexually transmitted disease (STD) testing. Diabetes screening. This is done by checking your blood sugar (glucose) after you have not eaten for a while (fasting). You may have this done every 1-3 years. Bone density scan. This is done to screen for osteoporosis. You may have this done starting at age 35. Mammogram. This may be done every 1-2 years. Talk to your health care provider about how often you should have regular mammograms. Talk with your health care provider about your test results, treatment options, and if necessary, the need for more tests. Vaccines  Your health care provider may recommend certain vaccines, such as: Influenza vaccine. This is recommended every year. Tetanus, diphtheria, and acellular pertussis (Tdap, Td) vaccine. You may need a Td booster every 10 years. Zoster vaccine. You may need this after age 76. Pneumococcal 13-valent conjugate (PCV13) vaccine. One dose is recommended after age 71. Pneumococcal polysaccharide (PPSV23) vaccine. One dose is recommended after age 28. Talk to your health care provider about which screenings and vaccines you need and how often you need them. This information is not intended to replace advice given to you by your health care provider. Make sure you discuss any questions you have with your health care provider. Document Released: 06/08/2015 Document Revised: 01/30/2016 Document Reviewed: 03/13/2015 Elsevier Interactive Patient Education  2017 [employer].  Fall Prevention in the Home Falls can cause injuries.  They can happen to people of all ages. There are many things you can do to make your home safe and to help prevent falls. What can I do on the outside of my home? Regularly fix the edges of walkways and driveways and fix any cracks. Remove anything that might make you trip as you walk through a door, such as a raised step or threshold. Trim any bushes or trees on the path to your home. Use bright outdoor lighting. Clear any walking paths of anything that might make someone trip, such as rocks or tools. Regularly check to see if handrails are loose or broken. Make sure that both sides of any steps have handrails. Any raised decks and porches should have guardrails on the edges. Have any leaves, snow, or ice cleared regularly. Use sand or salt on walking paths during winter. Clean up any spills in your garage right away. This includes oil or grease spills. What can I do in the bathroom? Use night lights. Install grab bars by the toilet and in the tub and shower. Do not use towel bars as grab bars. Use non-skid mats or decals in the tub or shower. If you  need to sit down in the shower, use a plastic, non-slip stool. Keep the floor dry. Clean up any water that spills on the floor as soon as it happens. Remove soap buildup in the tub or shower regularly. Attach bath mats securely with double-sided non-slip rug tape. Do not have throw rugs and other things on the floor that can make you trip. What can I do in the bedroom? Use night lights. Make sure that you have a light by your bed that is easy to reach. Do not use any sheets or blankets that are too big for your bed. They should not hang down onto the floor. Have a firm chair that has side arms. You can use this for support while you get dressed. Do not have throw rugs and other things on the floor that can make you trip. What can I do in the kitchen? Clean up any spills right away. Avoid walking on wet floors. Keep items that you use a lot  in easy-to-reach places. If you need to reach something above you, use a strong step stool that has a grab bar. Keep electrical cords out of the way. Do not use floor polish or wax that makes floors slippery. If you must use wax, use non-skid floor wax. Do not have throw rugs and other things on the floor that can make you trip. What can I do with my stairs? Do not leave any items on the stairs. Make sure that there are handrails on both sides of the stairs and use them. Fix handrails that are broken or loose. Make sure that handrails are as long as the stairways. Check any carpeting to make sure that it is firmly attached to the stairs. Fix any carpet that is loose or worn. Avoid having throw rugs at the top or bottom of the stairs. If you do have throw rugs, attach them to the floor with carpet tape. Make sure that you have a light switch at the top of the stairs and the bottom of the stairs. If you do not have them, ask someone to add them for you. What else can I do to help prevent falls? Wear shoes that: Do not have high heels. Have rubber bottoms. Are comfortable and fit you well. Are closed at the toe. Do not wear sandals. If you use a stepladder: Make sure that it is fully opened. Do not climb a closed stepladder. Make sure that both sides of the stepladder are locked into place. Ask someone to hold it for you, if possible. Clearly mark and make sure that you can see: Any grab bars or handrails. First and last steps. Where the edge of each step is. Use tools that help you move around (mobility aids) if they are needed. These include: Canes. Walkers. Scooters. Crutches. Turn on the lights when you go into a dark area. Replace any light bulbs as soon as they burn out. Set up your furniture so you have a clear path. Avoid moving your furniture around. If any of your floors are uneven, fix them. If there are any pets around you, be aware of where they are. Review your medicines  with your doctor. Some medicines can make you feel dizzy. This can increase your chance of falling. Ask your doctor what other things that you can do to help prevent falls. This information is not intended to replace advice given to you by your health care provider. Make sure you discuss any questions you have with your  health care provider. Document Released: 03/08/2009 Document Revised: 10/18/2015 Document Reviewed: 06/16/2014 Elsevier Interactive Patient Education  2017 [employer].

## 2022-11-25 ENCOUNTER — Other Ambulatory Visit: Payer: Self-pay | Admitting: Nurse Practitioner

## 2022-11-25 ENCOUNTER — Telehealth: Payer: Self-pay | Admitting: *Deleted

## 2022-11-25 ENCOUNTER — Telehealth: Payer: Self-pay

## 2022-11-25 NOTE — Telephone Encounter (Signed)
   Telephone encounter was:  Successful.  11/25/2022 Name: Gloria Owens MRN: [id number] DOB: 06/13/1935  Gloria Owens is a 87 y.o. year old female who is a primary care patient of Gilmore Laroche, FNP . The community resource team was consulted for assistance with Patient provided with information on Life alert available through her health insurance   Care guide performed the following interventions: Patient provided with information about care guide support team and interviewed to confirm resource needs.  Follow Up Plan:  No further follow up planned at this time. The patient has been provided with needed resources.  Yehuda Mao Greenauer -[facility], Population Health [phone] [address] , [place] [address] Email : [email or web address]

## 2022-11-25 NOTE — Telephone Encounter (Signed)
Patient left a voice message.  Returning missed call regarding UTI.  Please call patient back.

## 2022-11-25 NOTE — Telephone Encounter (Signed)
Patient is made aware that the Abt Bactrim that was prescribe at her office visit will cover her infection. Patient voiced understanding

## 2022-12-01 ENCOUNTER — Ambulatory Visit: Payer: Self-pay | Admitting: *Deleted

## 2022-12-01 NOTE — Patient Outreach (Addendum)
  Care Coordination   Initial Visit Note   08/24/2023 updated note for 12/01/22 Name: Gloria Owens MRN: [id number] DOB: 1936/01/30  Gloria Owens is a 87 y.o. year old female who sees Gilmore Laroche, FNP for primary care. I spoke with  [name] by phone today.  What matters to the patients health and wellness today?  Recurrent urinary tract infection (UTI) Drinks plenty of water, pain at night, duration 2 years, take tylenol for pain  Wears pads last 2-3 days large incontinent Will see pcp & continue to see urologist 12 surgeries 3 back surgery Owens bladder history Baseline could mow her 2 acre yard   Iron infusion- lip burn after a temperature taken with a Thermometer June 1st week 2024- getting better  Diabetes managed at home A1cs 7's & below generally Last one 7.2.  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  Moved from [employer] 24 yrs Caregiver stress- agrees to have English as a second language teacher, for RN CM to discuss her concerns with [facility] social worker      Goals Addressed             This Visit's Progress    Maintain health, help care for borther RN CM   Not on track    Interventions Today    Flowsheet Row Most Recent Value  Chronic Disease   Chronic disease during today's visit Diabetes, Hypertension (HTN), Other  [caregiver, UTI]  General Interventions   General Interventions Discussed/Reviewed General Interventions Discussed, Sick Day Rules, [facility], Doctor Visits  Doctor Visits Discussed/Reviewed Doctor Visits Discussed, PCP, Specialist  PCP/Specialist Visits Compliance with follow-up visit  Exercise Interventions   Exercise Discussed/Reviewed Exercise Discussed  Physical Activity Discussed/Reviewed Physical Activity Discussed  Education Interventions   Education Provided Provided Education  Economist, UTI symptoms/management]  Provided Verbal Education On Mental Health/Coping with Illness, Medication, Sick Day Rules, When to see the doctor, Programmer, applications  Mental Health Interventions   Mental Health Discussed/Reviewed Mental Health Discussed, Coping Strategies, Other  [care giver]  Nutrition Interventions   Nutrition Discussed/Reviewed Nutrition Discussed, Fluid intake  Pharmacy Interventions   Pharmacy Dicussed/Reviewed Pharmacy Topics Discussed, Affording Medications, Medications and their functions            Patient Stated   On track    Patient states that she currently does not have any goals set for herself.        SDOH assessments and interventions completed:  Yes     Care Coordination Interventions:  Yes, provided   Follow up plan: No further intervention required.   Encounter Outcome:  Pt. Visit Completed   Scout Gumbs L. Noelle Penner, RN, BSN, CCM [facility] Care Management Community Coordinator Office number [phone]

## 2022-12-03 NOTE — Patient Instructions (Addendum)
 Visit Information  Thank you for taking time to visit with me today. Please don't hesitate to contact me if I can be of assistance to you.   Following are the goals we discussed today:   Goals Addressed             This Visit's Progress    Maintain health, help care for borther RN CM   Not on track    Interventions Today    Flowsheet Row Most Recent Value  Chronic Disease   Chronic disease during today's visit Diabetes, Hypertension (HTN), Other  [caregiver, UTI]  General Interventions   General Interventions Discussed/Reviewed General Interventions Discussed, Sick Day Rules, [facility], Doctor Visits  Doctor Visits Discussed/Reviewed Doctor Visits Discussed, PCP, Specialist  PCP/Specialist Visits Compliance with follow-up visit  Exercise Interventions   Exercise Discussed/Reviewed Exercise Discussed  Physical Activity Discussed/Reviewed Physical Activity Discussed  Education Interventions   Education Provided Provided Education  Engineer, petroleum, UTI symptoms/management]  Provided Verbal Education On Mental Health/Coping with Illness, Medication, Sick Day Rules, When to see the doctor, Programmer, applications  Mental Health Interventions   Mental Health Discussed/Reviewed Mental Health Discussed, Coping Strategies, Other  [care giver]  Nutrition Interventions   Nutrition Discussed/Reviewed Nutrition Discussed, Fluid intake  Pharmacy Interventions   Pharmacy Dicussed/Reviewed Pharmacy Topics Discussed, Affording Medications, Medications and their functions            Patient Stated   On track    Patient states that she currently does not have any goals set for herself.        Our next appointment is by telephone on ? at ? With social worker  Please call the care guide team at [phone] if you need to cancel or reschedule your appointment.   If you are experiencing a Mental Health or Behavioral Health Crisis or need someone to talk to, please call the Suicide  and Crisis Lifeline: 988 call the [place] National Suicide Prevention Lifeline: 320-856-3809 or TTY: (732) 119-6104 TTY 252 617 6168) to talk to a trained counselor call 1-800-273-TALK (toll free, 24 hour hotline) call the [facility]: 5512489514 call 911   Patient verbalizes understanding of instructions and care plan provided today and agrees to view in MyChart. Active MyChart status and patient understanding of how to access instructions and care plan via MyChart confirmed with patient.     The patient has been provided with contact information for the care management team and has been advised to call with any health related questions or concerns.    Mikhail Hallenbeck L. Noelle Penner, RN, BSN, CCM [facility] Care Management Community Coordinator Office number [phone]

## 2022-12-12 ENCOUNTER — Ambulatory Visit (HOSPITAL_COMMUNITY)
Admission: RE | Admit: 2022-12-12 | Discharge: 2022-12-12 | Disposition: A | Discharge status: Home / Self Care | Payer: Medicare Other | Source: Ambulatory Visit | Attending: Family Medicine | Admitting: Family Medicine

## 2022-12-12 DIAGNOSIS — R1032 Left lower quadrant pain: Secondary | ICD-10-CM | POA: Insufficient documentation

## 2022-12-12 DIAGNOSIS — K5730 Diverticulosis of large intestine without perforation or abscess without bleeding: Secondary | ICD-10-CM | POA: Diagnosis not present

## 2022-12-12 LAB — POCT I-STAT CREATININE: Creatinine, Ser: 0.9 mg/dL (ref 0.44–1.00)

## 2022-12-12 MED ORDER — IOHEXOL 300 MG/ML  SOLN
100.0000 mL | Freq: Once | INTRAMUSCULAR | Status: AC | PRN
  Administered 2022-12-12: 100 mL via INTRAVENOUS

## 2022-12-17 ENCOUNTER — Ambulatory Visit: Payer: Self-pay | Admitting: *Deleted

## 2022-12-17 ENCOUNTER — Encounter: Payer: Self-pay | Admitting: *Deleted

## 2022-12-17 NOTE — Patient Instructions (Signed)
Visit Information  Thank you for taking time to visit with me today. Please don't hesitate to contact me if I can be of assistance to you.   Following are the goals we discussed today:   Goals Addressed               This Visit's Progress     Provide Caregiver Services & Resources. (pt-stated)   On track     Care Coordination Interventions:  Interventions Today    Flowsheet Row Most Recent Value  Chronic Disease   Chronic disease during today's visit Diabetes, Hypertension (HTN), Other  [Caregiver Stress & Fatigue]  General Interventions   General Interventions Discussed/Reviewed General Interventions Discussed, Labs, Vaccines, Doctor Visits, Health Screening, Annual Foot Exam, Lipid Profile, General Interventions Reviewed, Annual Eye Exam, Durable Medical Equipment (DME), Community Resources, Level of Care, Communication with  [Encouraged]  Labs Hgb A1c every 3 months, Kidney Function  [Encouraged]  Vaccines COVID-19, Flu, Pneumonia, RSV, Shingles, Tetanus/Pertussis/Diphtheria  [Encouraged]  Doctor Visits Discussed/Reviewed Doctor Visits Discussed, Specialist, Doctor Visits Reviewed, Annual Wellness Visits, PCP  [Encouraged]  Health Screening Bone Density, Colonoscopy, Mammogram  [Encouraged]  Durable Medical Equipment (DME) Glucomoter, BP Cuff, Other  [Cane & Eyeglasses]  PCP/Specialist Visits Compliance with follow-up visit  [Encouraged]  Communication with PCP/Specialists, RN  [Encouraged]  Level of Care Adult Daycare, Personal Care Services, Applications, Assisted Living, Skilled Nursing Facility  [Encouraged]  Applications Medicaid, Personal Care Services, FL-2  [Encouraged]  Exercise Interventions   Exercise Discussed/Reviewed Exercise Discussed, Assistive device use and maintanence, Exercise Reviewed, Physical Activity, Weight Managment  [Encouraged]  Physical Activity Discussed/Reviewed Physical Activity Discussed, Home Exercise Program (HEP), Physical Activity Reviewed,  Types of exercise  [Encouraged]  Weight Management Weight maintenance  [Encouraged]  Education Interventions   Education Provided Provided Therapist, sports, Provided Web-based Education, Provided Education  [Encouraged]  Provided Verbal Education On Nutrition, Mental Health/Coping with Illness, When to see the doctor, Foot Care, Eye Care, Applications, Labs, Blood Sugar Monitoring, Exercise, Medication, Development worker, community, Clinical research associate, Personal Care Services, FL-2  [Encouraged]  Mental Health Interventions   Mental Health Discussed/Reviewed Mental Health Discussed, Anxiety, Depression, Grief and Loss, Substance Abuse, Mental Health Reviewed, Coping Strategies, Suicide, Other, Crisis  [Domestic Violence]  Nutrition Interventions   Nutrition Discussed/Reviewed Nutrition Discussed, Adding fruits and vegetables, Increasing proteins, Decreasing fats, Decreasing salt, Supplemental nutrition, Decreasing sugar intake, Portion sizes, Carbohydrate meal planning, Fluid intake, Nutrition Reviewed  [Encouraged]  Pharmacy Interventions   Pharmacy Dicussed/Reviewed Pharmacy Topics Discussed, Medications and their functions, Medication Adherence, Affording Medications, Pharmacy Topics Reviewed  [Encouraged]  Safety Interventions   Safety Discussed/Reviewed Safety Discussed, Safety Reviewed  [Encouraged]  Advanced Directive Interventions   Advanced Directives Discussed/Reviewed Advanced Directives Discussed  [Completed]       Assessed Social Determinant of Health Barriers. Discussed Plans for Ongoing Care Management Follow Up. Provided Careers information officer Information for Care Management Team Members. Screened for Signs & Symptoms of Depression, Related to Chronic Disease State.  PHQ2 & PHQ9 Depression Screen Completed & Results Reviewed.  Suicidal Ideation & Homicidal Ideation Assessed - None Present.   Domestic Violence Assessed - None Present. Access to Weapons  Assessed - None Present.   Active Listening & Reflection Utilized.  Verbalization of Feelings Encouraged.  Emotional Support Provided. Feelings of Caregiver Burnout Validated. Caregiver Stress Acknowledged. Caregiver Resources Reviewed. Caregiver Support Groups Mailed. Self-Enrollment in Caregiver Support Group of Interest Emphasized. Crisis Support Information, Agencies, Services & Resources Discussed. Problem Solving Interventions  Identified. Task-Centered Solutions Implemented.   Solution-Focused Strategies Developed. Acceptance & Commitment Therapy Introduced. Brief Cognitive Behavioral Therapy Initiated. Client-Centered Therapy Enacted. Reviewed Prescription Medications & Discussed Importance of Compliance. Quality of Sleep Assessed & Sleep Hygiene Techniques Promoted. Discussed Higher Level of Care Options for Brother (I.e. Memory Care Assisted Living, Extended Care, Skilled Nursing, Etc.) & Encouraged Consideration. Verified Brother Has No In-Home Care Services, Geologist, engineering, Warden/ranger, Etc., Covered Under Firefighter.  Encouraged Consideration of Completion of Medicaid Application for Brother & Submission to [facility] of Social Services [phone]), for Processing. Verified Brother Has No Long-Term Care [employer], [employer], Plans, Coverage, Etc.  Confirmed Brother, Nor His Deceased Wife Were Veterans, Making Him Ineligible to Apply for Aid & Attendance Benefits, Through [employer]. Requested Review of The Following List of [employer], [employer], Resources, Occupational psychologist, Mailed on 12/17/2022: ~ Adult Day Care Programs  ~ In-Home Care & Respite Agencies ~ Home Health Care Agencies ~ Respite Care Agencies & Facilities ~ Personal Care Services Application  ~ Development worker, international aid Agency Providers ~ 2023 Medicaid Tips ~ Medicaid Application Encouraged Solicitor  with [employer], Nurse, adult, Field seismologist of Interest in [place], from List Provided, in An Effort to [employer] in The Home for Brother. Encouraged Completion of Application for Personal Care Services & Submission to [facility] [phone]) for Processing, Once Brother is Approved for Medicaid, through [facility] of Social Services [phone]). Encouraged Contact with CSW (# [phone]), if You Have Questions, Need Assistance, or If Additional Social Work Needs Are Identified Between Now & Our Next Scheduled Follow-Up Outreach Call.      Our next appointment is by telephone on 01/14/2023 at 3:00 pm.   Please call the care guide team at [phone] if you need to cancel or reschedule your appointment.   If you are experiencing a Mental Health or Behavioral Health Crisis or need someone to talk to, please call the Suicide and Crisis Lifeline: 988 call the [place] National Suicide Prevention Lifeline: 615-242-2598 or TTY: 330-422-2262 TTY (312)076-0662) to talk to a trained counselor call 1-800-273-TALK (toll free, 24 hour hotline) go to [facility] [address] [place] [phone]) call the [facility] Crisis Line: [phone] call 911  Patient verbalizes understanding of instructions and care plan provided today and agrees to view in MyChart. Active MyChart status and patient understanding of how to access instructions and care plan via MyChart confirmed with patient.     Telephone follow up appointment with care management team member scheduled for:  01/14/2023 at 3:00 pm.   Danford Bad, BSW, MSW, LCSW  Licensed Clinical Social Worker  [facility]  Mailing [place]. [address] [place], [place] [address] Physical Address-[address], [place], [place] [address] Toll Free Main # [phone] Fax #  [phone] Cell # [phone] [email or web address]

## 2022-12-17 NOTE — Patient Outreach (Signed)
Care Coordination   Initial Visit Note   12/17/2022  Name: Gloria Owens MRN: [id number] DOB: [birth year]  Gloria Owens is a 87 y.o. year old female who sees Gilmore Laroche, FNP for primary care. I spoke with [name] by phone today.  What matters to the patients health and wellness today?  Provide Engineer, technical sales & Resources.   Goals Addressed               This Visit's Progress     Provide Caregiver Services & Resources. (pt-stated)   On track     Care Coordination Interventions:  Interventions Today    Flowsheet Row Most Recent Value  Chronic Disease   Chronic disease during today's visit Diabetes, Hypertension (HTN), Other  [Caregiver Stress & Fatigue]  General Interventions   General Interventions Discussed/Reviewed General Interventions Discussed, Labs, Vaccines, Doctor Visits, Health Screening, Annual Foot Exam, Lipid Profile, General Interventions Reviewed, Annual Eye Exam, Durable Medical Equipment (DME), Community Resources, Level of Care, Communication with  [Encouraged]  Labs Hgb A1c every 3 months, Kidney Function  [Encouraged]  Vaccines COVID-19, Flu, Pneumonia, RSV, Shingles, Tetanus/Pertussis/Diphtheria  [Encouraged]  Doctor Visits Discussed/Reviewed Doctor Visits Discussed, Specialist, Doctor Visits Reviewed, Annual Wellness Visits, PCP  [Encouraged]  Health Screening Bone Density, Colonoscopy, Mammogram  [Encouraged]  Durable Medical Equipment (DME) Glucomoter, BP Cuff, Other  [Cane & Eyeglasses]  PCP/Specialist Visits Compliance with follow-up visit  [Encouraged]  Communication with PCP/Specialists, RN  [Encouraged]  Level of Care Adult Daycare, Personal Care Services, Applications, Assisted Living, Skilled Nursing Facility  [Encouraged]  Applications Medicaid, Personal Care Services, FL-2  [Encouraged]  Exercise Interventions   Exercise Discussed/Reviewed Exercise Discussed, Assistive device use and maintanence, Exercise Reviewed, Physical  Activity, Weight Managment  [Encouraged]  Physical Activity Discussed/Reviewed Physical Activity Discussed, Home Exercise Program (HEP), Physical Activity Reviewed, Types of exercise  [Encouraged]  Weight Management Weight maintenance  [Encouraged]  Education Interventions   Education Provided Provided Therapist, sports, Provided Web-based Education, Provided Education  [Encouraged]  Provided Verbal Education On Nutrition, Mental Health/Coping with Illness, When to see the doctor, Foot Care, Eye Care, Applications, Labs, Blood Sugar Monitoring, Exercise, Medication, Development worker, community, Clinical research associate, Personal Care Services, FL-2  [Encouraged]  Mental Health Interventions   Mental Health Discussed/Reviewed Mental Health Discussed, Anxiety, Depression, Grief and Loss, Substance Abuse, Mental Health Reviewed, Coping Strategies, Suicide, Other, Crisis  [Domestic Violence]  Nutrition Interventions   Nutrition Discussed/Reviewed Nutrition Discussed, Adding fruits and vegetables, Increasing proteins, Decreasing fats, Decreasing salt, Supplemental nutrition, Decreasing sugar intake, Portion sizes, Carbohydrate meal planning, Fluid intake, Nutrition Reviewed  [Encouraged]  Pharmacy Interventions   Pharmacy Dicussed/Reviewed Pharmacy Topics Discussed, Medications and their functions, Medication Adherence, Affording Medications, Pharmacy Topics Reviewed  [Encouraged]  Safety Interventions   Safety Discussed/Reviewed Safety Discussed, Safety Reviewed  [Encouraged]  Advanced Directive Interventions   Advanced Directives Discussed/Reviewed Advanced Directives Discussed  [Completed]       Assessed Social Determinant of Health Barriers. Discussed Plans for Ongoing Care Management Follow Up. Provided Careers information officer Information for Care Management Team Members. Screened for Signs & Symptoms of Depression, Related to Chronic Disease State.  PHQ2 & PHQ9 Depression Screen  Completed & Results Reviewed.  Suicidal Ideation & Homicidal Ideation Assessed - None Present.   Domestic Violence Assessed - None Present. Access to Weapons Assessed - None Present.   Active Listening & Reflection Utilized.  Verbalization of Feelings Encouraged.  Emotional Support Provided. Feelings of Caregiver Burnout Validated. Caregiver Stress  Acknowledged. Caregiver Resources Reviewed. Caregiver Support Groups Mailed. Self-Enrollment in Caregiver Support Group of Interest Emphasized. Crisis Support Information, Agencies, Services & Resources Discussed. Problem Solving Interventions Identified. Task-Centered Solutions Implemented.   Solution-Focused Strategies Developed. Acceptance & Commitment Therapy Introduced. Brief Cognitive Behavioral Therapy Initiated. Client-Centered Therapy Enacted. Reviewed Prescription Medications & Discussed Importance of Compliance. Quality of Sleep Assessed & Sleep Hygiene Techniques Promoted. Discussed Higher Level of Care Options for Brother (I.e. Memory Care Assisted Living, Extended Care, Skilled Nursing, Etc.) & Encouraged Consideration. Verified Brother Has No In-Home Care Services, Geologist, engineering, Warden/ranger, Etc., Covered Under Firefighter.  Encouraged Consideration of Completion of Medicaid Application for Brother & Submission to [facility] of Social Services [phone]), for Processing. Verified Brother Has No Long-Term Care [employer], [employer], Plans, Coverage, Etc.  Confirmed Brother, Nor His Deceased Wife Were Veterans, Making Him Ineligible to Apply for Aid & Attendance Benefits, Through [employer]. Requested Review of The Following List of [employer], [employer], Resources, Occupational psychologist, Mailed on 12/17/2022: ~ Adult Day Care Programs  ~ In-Home Care & Respite Agencies ~ Home Health Care Agencies ~ Respite Care Agencies  & Facilities ~ Personal Care Services Application  ~ Development worker, international aid Agency Providers ~ 2023 Medicaid Tips ~ Medicaid Application Encouraged Solicitor with [employer], Nurse, adult, Field seismologist of Interest in [place], from List Provided, in An Effort to [employer] in The Home for Brother. Encouraged Completion of Application for Personal Care Services & Submission to [facility] [phone]) for Processing, Once Brother is Approved for Medicaid, through [facility] of Social Services [phone]). Encouraged Contact with CSW (# [phone]), if You Have Questions, Need Assistance, or If Additional Social Work Needs Are Identified Between Now & Our Next Scheduled Follow-Up Outreach Call.        SDOH assessments and interventions completed:  Yes.  SDOH Interventions Today    Flowsheet Row Most Recent Value  SDOH Interventions   Food Insecurity Interventions Intervention Not Indicated  Housing Interventions Intervention Not Indicated  Transportation Interventions Intervention Not Indicated, Patient Resources (Friends/Family)  Utilities Interventions Intervention Not Indicated  Alcohol Usage Interventions Intervention Not Indicated (Score <7)  Financial Strain Interventions Intervention Not Indicated  Physical Activity Interventions Intervention Not Indicated  Stress Interventions Intervention Not Indicated  Social Connections Interventions Intervention Not Indicated  Health Literacy Interventions Intervention Not Indicated     Care Coordination Interventions:  Yes, provided.   Follow up plan: Follow up call scheduled for 01/14/2023 at 3:00 pm.   Encounter Outcome:  Pt. Visit Completed.   Danford Bad, BSW, MSW, LCSW  Licensed Restaurant manager, [facility]  Mailing [place], [place], [place] [address] Physical Address-[address],  [place], [place] [address] Toll Free Main # [phone] Fax # [phone] Cell # [phone] [email or web address]

## 2022-12-18 ENCOUNTER — Telehealth: Payer: Self-pay | Admitting: Family Medicine

## 2022-12-18 ENCOUNTER — Inpatient Hospital Stay: Payer: Medicare Other | Attending: Hematology

## 2022-12-18 DIAGNOSIS — D509 Iron deficiency anemia, unspecified: Secondary | ICD-10-CM | POA: Insufficient documentation

## 2022-12-18 DIAGNOSIS — D508 Other iron deficiency anemias: Secondary | ICD-10-CM

## 2022-12-18 LAB — CBC WITH DIFFERENTIAL/PLATELET
Abs Immature Granulocytes: 0.02 10*3/uL (ref 0.00–0.07)
Basophils Absolute: 0 10*3/uL (ref 0.0–0.1)
Basophils Relative: 0 %
Eosinophils Absolute: 0.1 10*3/uL (ref 0.0–0.5)
Eosinophils Relative: 2 %
HCT: 39.9 % (ref 36.0–46.0)
Hemoglobin: 12.9 g/dL (ref 12.0–15.0)
Immature Granulocytes: 0 %
Lymphocytes Relative: 27 %
Lymphs Abs: 1.5 10*3/uL (ref 0.7–4.0)
MCH: 29.5 pg (ref 26.0–34.0)
MCHC: 32.3 g/dL (ref 30.0–36.0)
MCV: 91.3 fL (ref 80.0–100.0)
Monocytes Absolute: 0.6 10*3/uL (ref 0.1–1.0)
Monocytes Relative: 10 %
Neutro Abs: 3.4 10*3/uL (ref 1.7–7.7)
Neutrophils Relative %: 61 %
Platelets: 141 10*3/uL — ABNORMAL LOW (ref 150–400)
RBC: 4.37 MIL/uL (ref 3.87–5.11)
RDW: 21.6 % — ABNORMAL HIGH (ref 11.5–15.5)
WBC: 5.6 10*3/uL (ref 4.0–10.5)
nRBC: 0 % (ref 0.0–0.2)

## 2022-12-18 LAB — FERRITIN: Ferritin: 193 ng/mL (ref 11–307)

## 2022-12-18 LAB — FOLATE: Folate: 30 ng/mL (ref >5.9)

## 2022-12-18 LAB — IRON AND TIBC
Iron: 72 ug/dL (ref 28–170)
Saturation Ratios: 23 % (ref 10.4–31.8)
TIBC: 312 ug/dL (ref 250–450)
UIBC: 240 ug/dL

## 2022-12-18 LAB — VITAMIN B12: Vitamin B-12: 789 pg/mL (ref 180–914)

## 2022-12-18 NOTE — Telephone Encounter (Signed)
Radiology is backed up with having reports read, let pt know we will contact her as soon as results are in.

## 2022-12-18 NOTE — Telephone Encounter (Signed)
Patient called about CT scan results

## 2022-12-23 ENCOUNTER — Other Ambulatory Visit: Payer: Self-pay | Admitting: Family Medicine

## 2022-12-23 DIAGNOSIS — N83201 Unspecified ovarian cyst, right side: Secondary | ICD-10-CM

## 2022-12-23 NOTE — Progress Notes (Signed)
Please inform the patient that I have ordered a pelvic ultrasound to follow up on the right ovarian cyst observed in her imaging study.

## 2022-12-30 NOTE — Progress Notes (Unsigned)
[facility] [address], [place] [address]   CLINIC:  Medical Oncology/Hematology  PCP:  Gilmore Laroche, FNP [address] [place] [address] [phone]   REASON FOR VISIT:  Follow-up for iron deficiency anemia  CURRENT THERAPY: Intermittent IV iron + daily iron tablet  INTERVAL HISTORY:   Gloria Owens 87 y.o. female returns for routine follow-up of iron deficiency anemia.  She was last seen by Dr. Ellin Saba on 11/04/2022.  She received IV Feraheme on 11/06/2022 and 11/14/2022.  At today's visit, she reports feeling fair.  She felt much better after her IV iron.  She continues to take daily iron tablet.  She does report that she had some lip and mouth swelling, itching, and sores after her first iron infusion - she attributed this to "allergic reaction to thermometer probe cover;" reports that she refused temperature check at her second iron infusion and did not have any recurrent symptoms.  She reports stool that is dark from iron supplementation but denies any bright red blood per rectum or melena.  Her energy is significantly improved and she denies any residual fatigue.  Ice pica has resolved.  She has occasional headaches at baseline.  She denies any chest pain, dyspnea on exertion, lightheadedness, or syncope.  Regarding new finding of elevated light chains, she has intermittent pain and chronic neuropathy related to prior back surgeries, but denies any new onset bone pain or neurologic changes.  She denies any B symptoms.  She does have ongoing left lower quadrant abdominal pain and frequent UTIs, which is being worked up by her PCP.  She has been previously found to have sigmoid colon stenosis and experiences some chronic constipation related to this.  She has 80% energy and 75% appetite. She endorses that she is maintaining a stable weight.  ASSESSMENT & PLAN:  1.  Severe iron deficiency anemia: - Labs from 10/27/2022 showed hemoglobin 9, ferritin  9, iron saturation 4% - History of PRBC transfusions required at the time of back surgeries - Additional hematology workup (12/18/2022) showed normal B12, folate, MMA, copper. - Taking iron tablet daily since June 2024  - Feraheme x 2 on 11/06/2022 and 11/14/2022 - Most recent labs (12/18/2022): Hgb 12.9/MCV 91.3, ferritin 193, iron saturation 23% - No prior EGD.  Patient denies any recent colonoscopies - reports exploratory laparotomy in 1967, found to have extensive endometriosis.  Exploratory laparotomy in 1970 with total abdominal hysterectomy and sigmoid resection with reanastomosis.  Details of procedure are not available.  Patient reports that she previously had attempts at colonoscopy, which were unsuccessful due to stenosis, reports that "sigmoid colon is no larger than in [place]  and therefore evaluated by barium enemas only" - Barium enema of colon (03/28/2022): Diverticulosis, no evidence of malignancy. - CT abdomen/pelvis (12/12/2022): No evidence of stomach or bowel malignancy, but she does have extensive colonic diverticulosis.  No pathologically enlarged lymph nodes.  Additional findings including bilateral adnexal cysts are being worked up by her PCP. - Reports dark stool after starting iron tablets, but denies any gross rectal bleeding or melena - PLAN: No indication for IV iron at this time.  Continue daily iron supplement.  Recheck in 3 months and consider additional testing (i.e. stool cards and referral to GI for EGD/barium enema of colon) if recurrent anemia.  2.  Elevated free light chains - Workup of anemia revealed significantly elevated kappa free light chain 265.2, with normal lambda free light chain 9.7, elevated FLC  ratio 27.34.  SPEP was negative. - PLAN: Additional workup of MGUS/myeloma is needed.  We will check 24-hour urine with UPEP/UIFE, labs (serum immunofixation, LDH, beta-2 microglobulin, CMP), and skeletal survey. - RTC 2 to 3 weeks to discuss results and next  steps.  Will provide thorough education on MGUS versus myeloma once testing has been completed.  3.  Social/family history: - Lives by herself at home and also takes care of her brother.  She is able to do all her ADLs and also mows her [place].5 acres land.  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  Non-smoker.  No chemical exposure. - 4 brothers had lung cancer.  1 of those brothers also had leukemia.  1 sister had pancreatic cancer.  Niece and nephew had cancer.  PLAN SUMMARY: >> 24-hour urine study >> Skeletal survey TODAY >> Labs TODAY = immunofixation, LDH, beta-2 microglobulin, CMP >> OFFICE visit in 2-3 weeks      REVIEW OF SYSTEMS:   Review of Systems  Constitutional:  Negative for appetite change, chills, diaphoresis, fatigue, fever and unexpected weight change.  HENT:   Negative for lump/mass and nosebleeds.   Eyes:  Negative for eye problems.  Respiratory:  Negative for cough, hemoptysis and shortness of breath.   Cardiovascular:  Negative for chest pain, leg swelling and palpitations.  Gastrointestinal:  Positive for abdominal pain (LLQ). Negative for blood in stool, constipation, diarrhea, nausea and vomiting.  Genitourinary:  Positive for difficulty urinating. Negative for hematuria.   Musculoskeletal:  Positive for arthralgias.  Skin: Negative.   Neurological:  Positive for numbness. Negative for dizziness, headaches and light-headedness.  Hematological:  Does not bruise/bleed easily.  Psychiatric/Behavioral:  Positive for sleep disturbance.      PHYSICAL EXAM:  ECOG PERFORMANCE STATUS: 1 - Symptomatic but completely ambulatory  Vitals:   12/31/22 1253  BP: (!) 141/67  Pulse: 79  Resp: 18  SpO2: 91%   Filed Weights   12/31/22 1253  Weight: 143 lb 11.8 oz (65.2 kg)   Physical Exam Constitutional:      Appearance: Normal appearance. She is normal weight.  Cardiovascular:     Heart sounds: Normal heart sounds.  Pulmonary:     Breath sounds: Normal breath sounds.   Neurological:     General: No focal deficit present.     Mental Status: Mental status is at baseline.  Psychiatric:        Behavior: Behavior normal. Behavior is cooperative.    PAST MEDICAL/SURGICAL HISTORY:  Past Medical History:  Diagnosis Date   Hypercholesteremia    Seizures (HCC)    with eye surgery several years ago-unknown etiology-no meds   Past Surgical History:  Procedure Laterality Date   ABDOMINAL HYSTERECTOMY     total hysterectomy   BACK SURGERY     fusion of back x2 and 1 other back surgery   CHOLECYSTECTOMY     COLON RESECTION     from endometriosis   CYSTOSCOPY     fulgeration of tumors   HERNIA REPAIR Right    INGUINAL HERNIA REPAIR Left 06/20/2013   Procedure: HERNIA REPAIR INGUINAL ADULT;  Surgeon: Dalia Heading, MD;  Location: [facility];  Service: General;  Laterality: Left;   INSERTION OF MESH Left 06/20/2013   Procedure: INSERTION OF MESH;  Surgeon: Dalia Heading, MD;  Location: [facility];  Service: General;  Laterality: Left;    SOCIAL HISTORY:  Social History   Socioeconomic History   Marital status: Widowed    Spouse name: Not  on file   Number of children: 0   Years of education: 12   Highest education level: 12th grade  Occupational History   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  Tobacco Use   Smoking status: Never    Passive exposure: Never   Smokeless tobacco: Never  Vaping Use   Vaping status: Never Used  Substance and Sexual Activity   Alcohol use: No   Drug use: No   Sexual activity: Not Currently    Partners: Male    Birth control/protection: Surgical  Other Topics Concern   Not on file  Social History Narrative   No children- had endometriosis an dhysterectomy; husband recently deceased   Nephew is Tanay Skaja- security guard at [employer]      11/24/22 patient caring for 1 brother who has Alzheimer and another brother is in a facility   Social Determinants of Health   Financial Resource Strain: Low  Risk  (12/17/2022)   Overall Financial Resource Strain (CARDIA)    Difficulty of Paying Living Expenses: Not hard at all  Food Insecurity: No Food Insecurity (12/17/2022)   Hunger Vital Sign    Worried About Running Out of Food in the Last Year: Never true    Ran Out of Food in the Last Year: Never true  Transportation Needs: No Transportation Needs (12/17/2022)   PRAPARE - Administrator, Civil Service (Medical): No    Lack of Transportation (Non-Medical): No  Physical Activity: Sufficiently Active (12/17/2022)   Exercise Vital Sign    Days of Exercise per Week: 7 days    Minutes of Exercise per Session: 30 min  Stress: No Stress Concern Present (12/17/2022)   [employer] of Occupational Health - Occupational Stress Questionnaire    Feeling of Stress : Not at all  Social Connections: Moderately Integrated (12/17/2022)   Social Connection and Isolation Panel [NHANES]    Frequency of Communication with Friends and Family: More than three times a week    Frequency of Social Gatherings with Friends and Family: More than three times a week    Attends Religious Services: More than 4 times per year    Active Member of [employer] or Organizations: Yes    Attends Banker Meetings: More than 4 times per year    Marital Status: Widowed  Intimate Partner Violence: Not At Risk (12/17/2022)   Humiliation, Afraid, Rape, and Kick questionnaire    Fear of Current or Ex-Partner: No    Emotionally Abused: No    Physically Abused: No    Sexually Abused: No    FAMILY HISTORY:  No family history on file.  CURRENT MEDICATIONS:  Outpatient Encounter Medications as of 12/31/2022  Medication Sig   aspirin EC 81 MG tablet Take 81 mg by mouth daily.   Calcium Carb-Cholecalciferol (CALTRATE 600+D3) 600-20 MG-MCG TABS Take 600 mg by mouth 2 (two) times daily.   cholecalciferol (VITAMIN D3) 25 MCG (1000 UNIT) tablet Take 2,000 Units by mouth daily.   feeding supplement, GLUCERNA SHAKE,  (GLUCERNA SHAKE) LIQD Take 237 mLs by mouth 3 (three) times daily between meals.   hydrOXYzine (VISTARIL) 25 MG capsule Take 1 capsule (25 mg total) by mouth at bedtime.   Iron, Ferrous Sulfate, 325 (65 Fe) MG TABS Take 325 mg by mouth daily.   metFORMIN (GLUCOPHAGE) 500 MG tablet Take 1 tablet (500 mg total) by mouth 2 (two) times daily with a meal.   mirabegron ER (MYRBETRIQ) 25 MG TB24  tablet Take 1 tablet (25 mg total) by mouth daily.   nitrofurantoin (MACRODANTIN) 50 MG capsule Take 1 capsule (50 mg total) by mouth at bedtime.   olmesartan (BENICAR) 20 MG tablet Take 1 tablet (20 mg total) by mouth daily.   Semaglutide (RYBELSUS) 3 MG TABS Take 1 tablet (3 mg total) by mouth daily.   simvastatin (ZOCOR) 40 MG tablet TAKE ONE TABLET BY MOUTH ONCE DAILY.   sulfamethoxazole-trimethoprim (BACTRIM DS) 800-160 MG tablet Take 1 tablet by mouth 2 (two) times daily.   sulfamethoxazole-trimethoprim (BACTRIM DS) 800-160 MG tablet Take 1 tablet by mouth every 12 (twelve) hours.   No facility-administered encounter medications on file as of 12/31/2022.    ALLERGIES:  Allergies  Allergen Reactions   Penicillins     LABORATORY DATA:  I have reviewed the labs as listed.  CBC    Component Value Date/Time   WBC 5.6 12/18/2022 1325   RBC 4.37 12/18/2022 1325   HGB 12.9 12/18/2022 1325   HGB 9.0 (L) 10/21/2022 0813   HCT 39.9 12/18/2022 1325   HCT 29.5 (L) 10/21/2022 0813   PLT 141 (L) 12/18/2022 1325   PLT 152 10/21/2022 0813   MCV 91.3 12/18/2022 1325   MCV 81 10/21/2022 0813   MCH 29.5 12/18/2022 1325   MCHC 32.3 12/18/2022 1325   RDW 21.6 (H) 12/18/2022 1325   RDW 15.7 (H) 10/21/2022 0813   LYMPHSABS 1.5 12/18/2022 1325   LYMPHSABS 1.1 10/21/2022 0813   MONOABS 0.6 12/18/2022 1325   EOSABS 0.1 12/18/2022 1325   EOSABS 0.1 10/21/2022 0813   BASOSABS 0.0 12/18/2022 1325   BASOSABS 0.0 10/21/2022 0813      Latest Ref Rng & Units 12/12/2022   10:13 AM 10/21/2022    8:13 AM 03/17/2022     8:15 AM  CMP  Glucose 70 - 99 mg/dL  161  096   BUN 8 - 27 mg/dL  25  21   Creatinine 0.45 - 1.00 mg/dL 4.09  8.11  9.14   Sodium 134 - 144 mmol/L  137  134   Potassium 3.5 - 5.2 mmol/L  4.5  5.1   Chloride 96 - 106 mmol/L  102  99   CO2 20 - 29 mmol/L  20  21   Calcium 8.7 - 10.3 mg/dL  9.6  9.6   Total Protein 6.0 - 8.5 g/dL  6.4  6.8   Total Bilirubin 0.0 - 1.2 mg/dL  0.3  0.3   Alkaline Phos 44 - 121 IU/L  60  59   AST 0 - 40 IU/L  21  19   ALT 0 - 32 IU/L  17  13     DIAGNOSTIC IMAGING:  I have independently reviewed the relevant imaging and discussed with the patient.   WRAP UP:  All questions were answered. The patient knows to call the clinic with any problems, questions or concerns.  Medical decision making: Moderate  Time spent on visit: I spent 20 minutes counseling the patient face to face. The total time spent in the appointment was 30 minutes and more than 50% was on counseling.  Carnella Guadalajara, PA-C  12/31/22 2:25 PM

## 2022-12-31 ENCOUNTER — Ambulatory Visit (HOSPITAL_COMMUNITY)
Admission: RE | Admit: 2022-12-31 | Discharge: 2022-12-31 | Disposition: A | Discharge status: Home / Self Care | Payer: Medicare Other | Source: Ambulatory Visit | Attending: Physician Assistant | Admitting: Physician Assistant

## 2022-12-31 ENCOUNTER — Inpatient Hospital Stay: Payer: Medicare Other

## 2022-12-31 ENCOUNTER — Ambulatory Visit (HOSPITAL_COMMUNITY)
Admission: RE | Admit: 2022-12-31 | Discharge: 2022-12-31 | Disposition: A | Discharge status: Home / Self Care | Payer: Medicare Other | Source: Ambulatory Visit | Attending: Family Medicine | Admitting: Family Medicine

## 2022-12-31 ENCOUNTER — Inpatient Hospital Stay: Payer: Medicare Other | Attending: Hematology | Admitting: Physician Assistant

## 2022-12-31 VITALS — BP 141/67 | HR 79 | Resp 18 | Wt 143.7 lb

## 2022-12-31 DIAGNOSIS — N83201 Unspecified ovarian cyst, right side: Secondary | ICD-10-CM | POA: Diagnosis not present

## 2022-12-31 DIAGNOSIS — D508 Other iron deficiency anemias: Secondary | ICD-10-CM

## 2022-12-31 DIAGNOSIS — R768 Other specified abnormal immunological findings in serum: Secondary | ICD-10-CM

## 2022-12-31 DIAGNOSIS — C9000 Multiple myeloma not having achieved remission: Secondary | ICD-10-CM | POA: Diagnosis not present

## 2022-12-31 DIAGNOSIS — M47812 Spondylosis without myelopathy or radiculopathy, cervical region: Secondary | ICD-10-CM | POA: Diagnosis not present

## 2022-12-31 LAB — COMPREHENSIVE METABOLIC PANEL
ALT: 22 U/L (ref 0–44)
AST: 24 U/L (ref 15–41)
Albumin: 4.3 g/dL (ref 3.5–5.0)
Alkaline Phosphatase: 56 U/L (ref 38–126)
Anion gap: 8 (ref 5–15)
BUN: 19 mg/dL (ref 8–23)
CO2: 27 mmol/L (ref 22–32)
Calcium: 9.5 mg/dL (ref 8.9–10.3)
Chloride: 100 mmol/L (ref 98–111)
Creatinine, Ser: 0.68 mg/dL (ref 0.44–1.00)
GFR, Estimated: >60 mL/min (ref >60)
Glucose, Bld: 100 mg/dL — ABNORMAL HIGH (ref 70–99)
Potassium: 3.9 mmol/L (ref 3.5–5.1)
Sodium: 135 mmol/L (ref 135–145)
Total Bilirubin: 0.7 mg/dL (ref 0.3–1.2)
Total Protein: 7.4 g/dL (ref 6.5–8.1)

## 2022-12-31 LAB — LACTATE DEHYDROGENASE: LDH: 118 U/L (ref 98–192)

## 2022-12-31 NOTE — Patient Instructions (Signed)
[facility] at [facility] **VISIT SUMMARY & IMPORTANT INSTRUCTIONS **   You were seen today by Rojelio Brenner PA-C for your follow up visit.    ANEMIA Your blood and iron levels look much better after your IV iron treatments. Continue to take your iron pill every day. We will continue to monitor your blood count and iron levels in about 3 months.  ELEVATED LIGHT CHAINS The labs that we checked showed an abnormal elevation in certain proteins called kappa free light chains. This is associated with type of precancerous condition called "MGUS," or a type of cancer called "multiple myeloma." We need to check additional tests to find out what is going on. Whole-body x-rays (do this today in the radiology department) 24-hour urine study (return this to the Cancer Center next week) Labs (done today during your appointment)  FOLLOW-UP APPOINTMENT: Office visit in 2 to 3 weeks to discuss results and next steps  ** Thank you for trusting me with your healthcare!  I strive to provide all of my patients with quality care at each visit.  If you receive a survey for this visit, I would be so grateful to you for taking the time to provide feedback.  Thank you in advance!  ~ Mikaia Janvier                   Dr. Doreatha Massed   &   Rojelio Brenner, PA-C   - - - - - - - - - - - - - - - - - -    Thank you for choosing [facility] at [facility] to provide your oncology and hematology care.  To afford each patient quality time with our provider, please arrive at least 15 minutes before your scheduled appointment time.   If you have a lab appointment with the Cancer Center please come in thru the Main Entrance and check in at the main information desk.  You need to re-schedule your appointment should you arrive 10 or more minutes late.  We strive to give you quality time with our providers, and arriving late affects you and other patients whose  appointments are after yours.  Also, if you no show three or more times for appointments you may be dismissed from the clinic at the providers discretion.     Again, thank you for choosing [facility].  Our hope is that these requests will decrease the amount of time that you wait before being seen by our physicians.       _____________________________________________________________  Should you have questions after your visit to [facility], please contact our office at [phone] and follow the prompts.  Our office hours are 8:00 a.m. and 4:30 p.m. Monday - Friday.  Please note that voicemails left after 4:00 p.m. may not be returned until the following business day.  We are closed weekends and major holidays.  You do have access to a nurse 24-7, just call the main number to the clinic [phone] and do not press any options, hold on the line and a nurse will answer the phone.    For prescription refill requests, have your pharmacy contact our office and allow 72 hours.

## 2023-01-06 ENCOUNTER — Other Ambulatory Visit: Payer: Self-pay | Admitting: *Deleted

## 2023-01-06 DIAGNOSIS — D472 Monoclonal gammopathy: Secondary | ICD-10-CM | POA: Diagnosis not present

## 2023-01-06 DIAGNOSIS — D509 Iron deficiency anemia, unspecified: Secondary | ICD-10-CM | POA: Diagnosis not present

## 2023-01-06 DIAGNOSIS — R768 Other specified abnormal immunological findings in serum: Secondary | ICD-10-CM

## 2023-01-06 DIAGNOSIS — D508 Other iron deficiency anemias: Secondary | ICD-10-CM

## 2023-01-08 ENCOUNTER — Other Ambulatory Visit: Payer: Self-pay | Admitting: Family Medicine

## 2023-01-08 DIAGNOSIS — N83201 Unspecified ovarian cyst, right side: Secondary | ICD-10-CM

## 2023-01-08 NOTE — Progress Notes (Signed)
A referral has been made  to GYN

## 2023-01-08 NOTE — Progress Notes (Signed)
Please inform the patient that her imaging study has revealed multiple cysts in the right ovary. Ovarian cysts are typically benign. A yearly follow-up is recommended to assess the resolution of the cysts.

## 2023-01-09 LAB — UPEP/UIFE/LIGHT CHAINS/TP, 24-HR UR

## 2023-01-13 NOTE — Progress Notes (Unsigned)
[facility] [address], [place] [address]   CLINIC:  Medical Oncology/Hematology  PCP:  Gilmore Laroche, FNP [address] [place] [address] [phone]   REASON FOR VISIT:  Follow-up for iron deficiency anemia and MGUS  CURRENT THERAPY: Intermittent IV iron + daily iron tablet  INTERVAL HISTORY:   [name] 87 y.o. female returns for routine follow-up of iron deficiency anemia and MGUS.  She was seen by Rojelio Brenner PA-C on 12/31/2022 to address her iron deficiency anemia, and turns today to discuss workup of newly discovered MGUS.  She denies any changes since her visit 2 weeks ago.  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  She denies any B symptoms.  She does have ongoing left lower quadrant abdominal pain and frequent UTIs, which is being worked up by her PCP.  She has been previously found to have sigmoid colon stenosis and experiences some chronic constipation related to this.  She has 75% energy and 75% appetite. She endorses that she is maintaining a stable weight.  ASSESSMENT & PLAN:  1.  Severe iron deficiency anemia: - Not addressed at today's visit.  See assessment and plan from 12/31/2022.   - PLAN: No indication for IV iron at this time.  Continue daily iron supplement.  Recheck in 3 months and consider additional testing (i.e. stool cards and referral to GI for EGD/barium enema of colon) if recurrent anemia.  2.  Elevated free light chains, suspected light chain MGUS - Workup of anemia revealed significantly elevated kappa free light chain 265.2, with normal lambda free light chain 9.7, elevated FLC ratio 27.34.  SPEP was negative. - Additional labs (12/31/2022): Immunofixation = faint band in IgA, light chain specificity unable to be verified. Creatinine 0.68, calcium 9.5, Hgb 12.9. Beta-2 microglobulin 1.9, LDH 118. -  24-hour urine/UPEP (01/06/2023): Urine immunofixation negative.  Urine M spike not observed. - Skeletal survey (12/31/2022): No lytic or sclerotic lesions identified. - PLAN: Discussed diagnosis of MGUS and need for ongoing monitoring due to risk of progression to multiple myeloma. - Repeat MGUS/myeloma panel in 3 months, and every 6 months thereafter with annual skeletal survey  3.  Social/family history: - Lives by herself at home and also takes care of her brother.  She is able to do all her ADLs and also mows her 2.5 acres land.  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  Non-smoker.  No chemical exposure. - 4 brothers had lung cancer.  1 of those brothers also had leukemia.  1 sister had pancreatic cancer.  Niece and nephew had cancer.  PLAN SUMMARY: >> Labs in 3 months = CBC/D, CMP, SPEP, light chains, immunofixation, LDH, ferritin, iron/TIBC >> OFFICE visit in 3 months (1 week after labs)     REVIEW OF SYSTEMS:   Review of Systems  Constitutional:  Negative for appetite change, chills, diaphoresis, fatigue, fever and unexpected weight change.  HENT:   Negative for lump/mass and nosebleeds.   Eyes:  Negative for eye problems.  Respiratory:  Positive for cough. Negative for hemoptysis and shortness of breath.   Cardiovascular:  Negative for chest pain, leg swelling and palpitations.  Gastrointestinal:  Positive for abdominal pain (LLQ), constipation and nausea. Negative for blood in stool, diarrhea and vomiting.  Genitourinary:  Negative for hematuria.   Musculoskeletal:  Positive for arthralgias.  Skin: Negative.   Neurological:  Positive  for headaches. Negative for dizziness and light-headedness.  Hematological:  Does not bruise/bleed easily.  Psychiatric/Behavioral:  Positive for sleep disturbance.      PHYSICAL EXAM:  ECOG PERFORMANCE STATUS: 1 - Symptomatic but completely ambulatory  Vitals:   01/14/23 1047  BP: (!) 144/64  Pulse: 73  Resp: 16  Temp: 98.1 F (36.7 C)  SpO2: 98%     Filed Weights   01/14/23 1047  Weight: 144 lb 8 oz (65.5 kg)    Physical Exam Constitutional:      Appearance: Normal appearance. She is normal weight.  Cardiovascular:     Heart sounds: Normal heart sounds.  Pulmonary:     Breath sounds: Normal breath sounds.  Neurological:     General: No focal deficit present.     Mental Status: Mental status is at baseline.  Psychiatric:        Behavior: Behavior normal. Behavior is cooperative.    PAST MEDICAL/SURGICAL HISTORY:  Past Medical History:  Diagnosis Date   Hypercholesteremia    Seizures (HCC)    with eye surgery several years ago-unknown etiology-no meds   Past Surgical History:  Procedure Laterality Date   ABDOMINAL HYSTERECTOMY     total hysterectomy   BACK SURGERY     fusion of back x2 and 1 other back surgery   CHOLECYSTECTOMY     COLON RESECTION     from endometriosis   CYSTOSCOPY     fulgeration of tumors   HERNIA REPAIR Right    INGUINAL HERNIA REPAIR Left 06/20/2013   Procedure: HERNIA REPAIR INGUINAL ADULT;  Surgeon: Dalia Heading, MD;  Location: [facility];  Service: General;  Laterality: Left;   INSERTION OF MESH Left 06/20/2013   Procedure: INSERTION OF MESH;  Surgeon: Dalia Heading, MD;  Location: [facility];  Service: General;  Laterality: Left;    SOCIAL HISTORY:  Social History   Socioeconomic History   Marital status: Widowed    Spouse name: Not on file   Number of children: 0   Years of education: 22   Highest education level: 12th grade  Occupational History   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  Tobacco Use   Smoking status: Never    Passive exposure: Never   Smokeless tobacco: Never  Vaping Use   Vaping status: Never Used  Substance and Sexual Activity   Alcohol use: No   Drug use: No   Sexual activity: Not Currently    Partners: Male    Birth control/protection: Surgical  Other Topics Concern   Not on file  Social History Narrative   No children- had  endometriosis an dhysterectomy; husband recently deceased   Nephew is Kilynn Fiumara- security guard at [employer]      11/24/22 patient caring for 1 brother who has Alzheimer and another brother is in a facility   Social Determinants of Health   Financial Resource Strain: Low Risk  (12/17/2022)   Overall Financial Resource Strain (CARDIA)    Difficulty of Paying Living Expenses: Not hard at all  Food Insecurity: No Food Insecurity (12/17/2022)   Hunger Vital Sign    Worried About Running Out of Food in the Last Year: Never true    Ran Out of Food in the Last Year: Never true  Transportation Needs: No Transportation Needs (12/17/2022)   PRAPARE - Administrator, Civil Service (Medical): No    Lack of Transportation (Non-Medical): No  Physical Activity: Sufficiently Active (12/17/2022)  Exercise Vital Sign    Days of Exercise per Week: 7 days    Minutes of Exercise per Session: 30 min  Stress: No Stress Concern Present (12/17/2022)   [employer] of Occupational Health - Occupational Stress Questionnaire    Feeling of Stress : Not at all  Social Connections: Moderately Integrated (12/17/2022)   Social Connection and Isolation Panel [NHANES]    Frequency of Communication with Friends and Family: More than three times a week    Frequency of Social Gatherings with Friends and Family: More than three times a week    Attends Religious Services: More than 4 times per year    Active Member of [employer] or Organizations: Yes    Attends Banker Meetings: More than 4 times per year    Marital Status: Widowed  Intimate Partner Violence: Not At Risk (12/17/2022)   Humiliation, Afraid, Rape, and Kick questionnaire    Fear of Current or Ex-Partner: No    Emotionally Abused: No    Physically Abused: No    Sexually Abused: No    FAMILY HISTORY:  No family history on file.  CURRENT MEDICATIONS:  Outpatient Encounter Medications as of 01/14/2023  Medication Sig   aspirin EC 81 MG  tablet Take 81 mg by mouth daily.   Calcium Carb-Cholecalciferol (CALTRATE 600+D3) 600-20 MG-MCG TABS Take 600 mg by mouth 2 (two) times daily.   cholecalciferol (VITAMIN D3) 25 MCG (1000 UNIT) tablet Take 2,000 Units by mouth daily.   feeding supplement, GLUCERNA SHAKE, (GLUCERNA SHAKE) LIQD Take 237 mLs by mouth 3 (three) times daily between meals.   hydrOXYzine (VISTARIL) 25 MG capsule Take 1 capsule (25 mg total) by mouth at bedtime.   Iron, Ferrous Sulfate, 325 (65 Fe) MG TABS Take 325 mg by mouth daily.   metFORMIN (GLUCOPHAGE) 500 MG tablet Take 1 tablet (500 mg total) by mouth 2 (two) times daily with a meal.   mirabegron ER (MYRBETRIQ) 25 MG TB24 tablet Take 1 tablet (25 mg total) by mouth daily.   nitrofurantoin (MACRODANTIN) 50 MG capsule Take 1 capsule (50 mg total) by mouth at bedtime.   olmesartan (BENICAR) 20 MG tablet Take 1 tablet (20 mg total) by mouth daily.   Semaglutide (RYBELSUS) 3 MG TABS Take 1 tablet (3 mg total) by mouth daily.   simvastatin (ZOCOR) 40 MG tablet TAKE ONE TABLET BY MOUTH ONCE DAILY.   sulfamethoxazole-trimethoprim (BACTRIM DS) 800-160 MG tablet Take 1 tablet by mouth 2 (two) times daily.   sulfamethoxazole-trimethoprim (BACTRIM DS) 800-160 MG tablet Take 1 tablet by mouth every 12 (twelve) hours.   No facility-administered encounter medications on file as of 01/14/2023.    ALLERGIES:  Allergies  Allergen Reactions   Penicillins     LABORATORY DATA:  I have reviewed the labs as listed.  CBC    Component Value Date/Time   WBC 5.6 12/18/2022 1325   RBC 4.37 12/18/2022 1325   HGB 12.9 12/18/2022 1325   HGB 9.0 (L) 10/21/2022 0813   HCT 39.9 12/18/2022 1325   HCT 29.5 (L) 10/21/2022 0813   PLT 141 (L) 12/18/2022 1325   PLT 152 10/21/2022 0813   MCV 91.3 12/18/2022 1325   MCV 81 10/21/2022 0813   MCH 29.5 12/18/2022 1325   MCHC 32.3 12/18/2022 1325   RDW 21.6 (H) 12/18/2022 1325   RDW 15.7 (H) 10/21/2022 0813   LYMPHSABS 1.5 12/18/2022  1325   LYMPHSABS 1.1 10/21/2022 0813   MONOABS 0.6 12/18/2022 1325  EOSABS 0.1 12/18/2022 1325   EOSABS 0.1 10/21/2022 0813   BASOSABS 0.0 12/18/2022 1325   BASOSABS 0.0 10/21/2022 0813      Latest Ref Rng & Units 12/31/2022    1:44 PM 12/12/2022   10:13 AM 10/21/2022    8:13 AM  CMP  Glucose 70 - 99 mg/dL 161   096   BUN 8 - 23 mg/dL 19   25   Creatinine 0.45 - 1.00 mg/dL 4.09  8.11  9.14   Sodium 135 - 145 mmol/L 135   137   Potassium 3.5 - 5.1 mmol/L 3.9   4.5   Chloride 98 - 111 mmol/L 100   102   CO2 22 - 32 mmol/L 27   20   Calcium 8.9 - 10.3 mg/dL 9.5   9.6   Total Protein 6.5 - 8.1 g/dL 7.4   6.4   Total Bilirubin 0.3 - 1.2 mg/dL 0.7   0.3   Alkaline Phos 38 - 126 U/L 56   60   AST 15 - 41 U/L 24   21   ALT 0 - 44 U/L 22   17     DIAGNOSTIC IMAGING:  I have independently reviewed the relevant imaging and discussed with the patient.   WRAP UP:  All questions were answered. The patient knows to call the clinic with any problems, questions or concerns.  Medical decision making: Moderate  Time spent on visit: I spent 20 minutes counseling the patient face to face. The total time spent in the appointment was 30 minutes and more than 50% was on counseling.  Carnella Guadalajara, PA-C  01/14/23 12:09 PM

## 2023-01-14 ENCOUNTER — Ambulatory Visit: Payer: Self-pay | Admitting: *Deleted

## 2023-01-14 ENCOUNTER — Inpatient Hospital Stay: Payer: Medicare Other | Admitting: Physician Assistant

## 2023-01-14 VITALS — BP 144/64 | HR 73 | Temp 98.1°F | Resp 16 | Ht 65.0 in | Wt 144.5 lb

## 2023-01-14 DIAGNOSIS — D508 Other iron deficiency anemias: Secondary | ICD-10-CM

## 2023-01-14 DIAGNOSIS — R768 Other specified abnormal immunological findings in serum: Secondary | ICD-10-CM

## 2023-01-14 DIAGNOSIS — D509 Iron deficiency anemia, unspecified: Secondary | ICD-10-CM | POA: Diagnosis not present

## 2023-01-14 DIAGNOSIS — D472 Monoclonal gammopathy: Secondary | ICD-10-CM

## 2023-01-14 NOTE — Patient Instructions (Signed)
[facility] at [facility] **VISIT SUMMARY & IMPORTANT INSTRUCTIONS **   You were seen today by Rojelio Brenner PA-C for your follow up visit.    ANEMIA: As we discussed at your last visit, your blood and iron levels look much better after your IV iron.  You should continue to take your iron pill every day.  We will recheck your blood count and iron levels in about 3 months.  MGUS ("Monoclonal Gammopathy of Undetermined Significance"): As we discussed, this is a precancerous condition in which you have increased immunoglobulin proteins in your blood.  This can progress to a type of cancer called multiple myeloma.  You do NOT have multiple myeloma cancer at this time, but we will need to continue monitoring your labs in the future.  We will recheck these labs in 3 months, and every 6 months thereafter.  FOLLOW-UP APPOINTMENT: Lab send office visit in 3 months  ** Thank you for trusting me with your healthcare!  I strive to provide all of my patients with quality care at each visit.  If you receive a survey for this visit, I would be so grateful to you for taking the time to provide feedback.  Thank you in advance!  ~ Jlyn Cerros                   Dr. Doreatha Massed   &   Rojelio Brenner, PA-C   - - - - - - - - - - - - - - - - - -    Thank you for choosing [facility] at [facility] to provide your oncology and hematology care.  To afford each patient quality time with our provider, please arrive at least 15 minutes before your scheduled appointment time.   If you have a lab appointment with the Cancer Center please come in thru the Main Entrance and check in at the main information desk.  You need to re-schedule your appointment should you arrive 10 or more minutes late.  We strive to give you quality time with our providers, and arriving late affects you and other patients whose appointments are after yours.  Also, if you no show three or more  times for appointments you may be dismissed from the clinic at the providers discretion.     Again, thank you for choosing [facility].  Our hope is that these requests will decrease the amount of time that you wait before being seen by our physicians.       _____________________________________________________________  Should you have questions after your visit to [facility], please contact our office at [phone] and follow the prompts.  Our office hours are 8:00 a.m. and 4:30 p.m. Monday - Friday.  Please note that voicemails left after 4:00 p.m. may not be returned until the following business day.  We are closed weekends and major holidays.  You do have access to a nurse 24-7, just call the main number to the clinic [phone] and do not press any options, hold on the line and a nurse will answer the phone.    For prescription refill requests, have your pharmacy contact our office and allow 72 hours.

## 2023-01-15 ENCOUNTER — Encounter: Payer: Self-pay | Admitting: *Deleted

## 2023-01-15 NOTE — Patient Outreach (Signed)
Care Coordination   Follow Up Visit Note   01/15/2023 - Late Entry  Name: Gloria Owens MRN: [id number] DOB: 28-May-1935  Gloria Owens is a 87 y.o. year old female who sees Gloria Laroche, FNP for primary care. I spoke with Gloria Owens by phone today.  What matters to the patients health and wellness today?  Provide Engineer, technical sales & Resources.   Goals Addressed               This Visit's Progress     Provide Caregiver Services & Resources. (pt-stated)   On track     Care Coordination Interventions:  Interventions Today    Flowsheet Row Most Recent Value  Chronic Disease   Chronic disease during today's visit Diabetes, Hypertension (HTN), Other  [Caregiver Stress & Fatigue]  General Interventions   General Interventions Discussed/Reviewed General Interventions Discussed, Labs, Vaccines, Doctor Visits, Health Screening, Annual Foot Exam, Lipid Profile, General Interventions Reviewed, Annual Eye Exam, Durable Medical Equipment (DME), Community Resources, Level of Care, Communication with  [Encouraged]  Labs Hgb A1c every 3 months, Kidney Function  [Encouraged]  Vaccines COVID-19, Flu, Pneumonia, RSV, Shingles, Tetanus/Pertussis/Diphtheria  [Encouraged]  Doctor Visits Discussed/Reviewed Doctor Visits Discussed, Specialist, Doctor Visits Reviewed, Annual Wellness Visits, PCP  [Encouraged]  Health Screening Bone Density, Colonoscopy, Mammogram  [Encouraged]  Durable Medical Equipment (DME) Glucomoter, BP Cuff, Other  [Cane & Eyeglasses]  PCP/Specialist Visits Compliance with follow-up visit  [Encouraged]  Communication with PCP/Specialists, RN  [Encouraged]  Level of Care Adult Daycare, Personal Care Services, Applications, Assisted Living, Skilled Nursing Facility  [Encouraged]  Applications Medicaid, Personal Care Services, FL-2  [Encouraged]  Exercise Interventions   Exercise Discussed/Reviewed Exercise Discussed, Assistive device use and maintanence, Exercise Reviewed,  Physical Activity, Weight Managment  [Encouraged]  Physical Activity Discussed/Reviewed Physical Activity Discussed, Home Exercise Program (HEP), Physical Activity Reviewed, Types of exercise  [Encouraged]  Weight Management Weight maintenance  [Encouraged]  Education Interventions   Education Provided Provided Therapist, sports, Provided Web-based Education, Provided Education  [Encouraged]  Provided Verbal Education On Nutrition, Mental Health/Coping with Illness, When to see the doctor, Foot Care, Eye Care, Applications, Labs, Blood Sugar Monitoring, Exercise, Medication, Development worker, community, Clinical research associate, Personal Care Services, [place]-2  [Encouraged]  Mental Health Interventions   Mental Health Discussed/Reviewed Mental Health Discussed, Anxiety, Depression, Grief and Loss, Substance Abuse, Mental Health Reviewed, Coping Strategies, Suicide, Other, Crisis  [Domestic Violence]  Nutrition Interventions   Nutrition Discussed/Reviewed Nutrition Discussed, Adding fruits and vegetables, Increasing proteins, Decreasing fats, Decreasing salt, Supplemental nutrition, Decreasing sugar intake, Portion sizes, Carbohydrate meal planning, Fluid intake, Nutrition Reviewed  [Encouraged]  Pharmacy Interventions   Pharmacy Dicussed/Reviewed Pharmacy Topics Discussed, Medications and their functions, Medication Adherence, Affording Medications, Pharmacy Topics Reviewed  [Encouraged]  Safety Interventions   Safety Discussed/Reviewed Safety Discussed, Safety Reviewed  [Encouraged]  Advanced Directive Interventions   Advanced Directives Discussed/Reviewed Advanced Directives Discussed  [Completed]       Active Listening & Reflection Utilized.  Verbalization of Feelings Encouraged.  Emotional Support Provided. Feelings of Gratitude Validated. Diminished Symptoms of Anxiety & Stress Acknowledged. Problem Solving Interventions Activated. Task-Centered Solutions Indicated.    Solution-Focused Strategies Employed. Acceptance & Commitment Therapy Performed. Brief Cognitive Behavioral Therapy Initiated. Client-Centered Therapy Implemented. Encouraged Celebration of [name] that You Have Not Been Diagnosed with Multiple Myeloma. Encouraged Administration of Medications, Exactly as Prescribed. Encouraged Increased Level of Activity & Exercise, as Tolerated. Encouraged Implementation of Deep Breathing Exercises, Relaxation Techniques, &  Mindfulness Meditation Strategies Daily. Encouraged Review of Medicaid Application with Brother, Offering Assistance with Completion & Submission to The [facility] of Social Services [phone]) for Processing, During Next Scheduled Follow-Up Outreach Call. Encouraged Review of The Following List of [employer], Nurse, adult, & Resources in [place], [employer] of Interest, in An Effort to [employer] & Supervision for [employer], in The Home & Community: ~ Adult Day Care Programs  ~ In-Home Care & Respite Agencies ~ Home Health Care Agencies ~ Respite Care Agencies & Facilities ~ Personal Care Services Application  ~ Development worker, international aid Agency Providers ~ 2023 Medicaid Tips ~ Medicaid Application Encouraged Completion of Application for Personal Care Services & Submission to [employer] [phone]) for Processing, Once Brother is Approved for Medicaid, through [facility] of [facility] [phone]). Encouraged Contact with CSW (# [phone]), if You Have Questions, Need Assistance, or If Additional Social Work Needs Are Identified Between Now & Our Next Scheduled Follow-Up Outreach Call.      SDOH assessments and interventions completed:  Yes.  Care Coordination Interventions:  Yes, provided.   Follow up plan: Follow up call scheduled for 02/02/2023 at 12:00 pm.  Encounter Outcome:  Pt. Visit Completed.   Danford Bad, BSW,  MSW, Printmaker Social Work Case Set designer Health  [facility], Population Health Direct Dial: [phone]  Fax: [phone] Email: [email or web address]: [email or web address]

## 2023-01-15 NOTE — Patient Instructions (Signed)
Visit Information  Thank you for taking time to visit with me today. Please don't hesitate to contact me if I can be of assistance to you.   Following are the goals we discussed today:   Goals Addressed               This Visit's Progress     Provide Caregiver Services & Resources. (pt-stated)   On track     Care Coordination Interventions:  Interventions Today    Flowsheet Row Most Recent Value  Chronic Disease   Chronic disease during today's visit Diabetes, Hypertension (HTN), Other  [Caregiver Stress & Fatigue]  General Interventions   General Interventions Discussed/Reviewed General Interventions Discussed, Labs, Vaccines, Doctor Visits, Health Screening, Annual Foot Exam, Lipid Profile, General Interventions Reviewed, Annual Eye Exam, Durable Medical Equipment (DME), Community Resources, Level of Care, Communication with  [Encouraged]  Labs Hgb A1c every 3 months, Kidney Function  [Encouraged]  Vaccines COVID-19, Flu, Pneumonia, RSV, Shingles, Tetanus/Pertussis/Diphtheria  [Encouraged]  Doctor Visits Discussed/Reviewed Doctor Visits Discussed, Specialist, Doctor Visits Reviewed, Annual Wellness Visits, PCP  [Encouraged]  Health Screening Bone Density, Colonoscopy, Mammogram  [Encouraged]  Durable Medical Equipment (DME) Glucomoter, BP Cuff, Other  [Cane & Eyeglasses]  PCP/Specialist Visits Compliance with follow-up visit  [Encouraged]  Communication with PCP/Specialists, RN  [Encouraged]  Level of Care Adult Daycare, Personal Care Services, Applications, Assisted Living, Skilled Nursing Facility  [Encouraged]  Applications Medicaid, Personal Care Services, [place]-2  [Encouraged]  Exercise Interventions   Exercise Discussed/Reviewed Exercise Discussed, Assistive device use and maintanence, Exercise Reviewed, Physical Activity, Weight Managment  [Encouraged]  Physical Activity Discussed/Reviewed Physical Activity Discussed, Home Exercise Program (HEP), Physical Activity Reviewed,  Types of exercise  [Encouraged]  Weight Management Weight maintenance  [Encouraged]  Education Interventions   Education Provided Provided Therapist, sports, Provided Web-based Education, Provided Education  [Encouraged]  Provided Verbal Education On Nutrition, Mental Health/Coping with Illness, When to see the doctor, Foot Care, Eye Care, Applications, Labs, Blood Sugar Monitoring, Exercise, Medication, Development worker, community, Clinical research associate, Personal Care Services, [place]-2  [Encouraged]  Mental Health Interventions   Mental Health Discussed/Reviewed Mental Health Discussed, Anxiety, Depression, Grief and Loss, Substance Abuse, Mental Health Reviewed, Coping Strategies, Suicide, Other, Crisis  [Domestic Violence]  Nutrition Interventions   Nutrition Discussed/Reviewed Nutrition Discussed, Adding fruits and vegetables, Increasing proteins, Decreasing fats, Decreasing salt, Supplemental nutrition, Decreasing sugar intake, Portion sizes, Carbohydrate meal planning, Fluid intake, Nutrition Reviewed  [Encouraged]  Pharmacy Interventions   Pharmacy Dicussed/Reviewed Pharmacy Topics Discussed, Medications and their functions, Medication Adherence, Affording Medications, Pharmacy Topics Reviewed  [Encouraged]  Safety Interventions   Safety Discussed/Reviewed Safety Discussed, Safety Reviewed  [Encouraged]  Advanced Directive Interventions   Advanced Directives Discussed/Reviewed Advanced Directives Discussed  [Completed]       Active Listening & Reflection Utilized.  Verbalization of Feelings Encouraged.  Emotional Support Provided. Feelings of Gratitude Validated. Diminished Symptoms of Anxiety & Stress Acknowledged. Problem Solving Interventions Activated. Task-Centered Solutions Indicated.   Solution-Focused Strategies Employed. Acceptance & Commitment Therapy Performed. Brief Cognitive Behavioral Therapy Initiated. Client-Centered Therapy  Implemented. Encouraged Celebration of [name] that You Have Not Been Diagnosed with Multiple Myeloma. Encouraged Administration of Medications, Exactly as Prescribed. Encouraged Increased Level of Activity & Exercise, as Tolerated. Encouraged Implementation of Deep Breathing Exercises, Relaxation Techniques, & Mindfulness Meditation Strategies Daily. Encouraged Review of Medicaid Application with Brother, Offering Assistance with Completion & Submission to [facility] of Social Services [phone]) for Processing, During Next  Scheduled Follow-Up Outreach Call. Encouraged Review of The Following List of [employer], Nurse, adult, & Resources in [place], [employer] of Interest, in An Effort to [employer] & Supervision for [employer], in The Home & Community: ~ Adult Day Care Programs  ~ In-Home Care & Respite Agencies ~ Home Health Care Agencies ~ Respite Care Agencies & Facilities ~ Personal Care Services Application  ~ Development worker, international aid Agency Providers ~ 2023 Medicaid Tips ~ Medicaid Application Encouraged Completion of Application for Personal Care Services & Submission to [employer] [phone]) for Processing, Once Brother is Approved for Medicaid, through The [facility] [phone]). Encouraged Contact with CSW (# [phone]), if You Have Questions, Need Assistance, or If Additional Social Work Needs Are Identified Between Now & Our Next Scheduled Follow-Up Outreach Call.      Our next appointment is by telephone on 02/02/2023 at 12:00 pm.  Please call the care guide team at [phone] if you need to cancel or reschedule your appointment.   If you are experiencing a Mental Health or Behavioral Health Crisis or need someone to talk to, please call the Suicide and Crisis Lifeline: 988 call the [place] National Suicide Prevention Lifeline: 281-396-6493 or TTY: (978)234-7313  TTY (618)064-3711) to talk to a trained counselor call 1-800-273-TALK (toll free, 24 hour hotline) go to [facility] [address], [place] [phone]) call the [facility] Crisis Line: [phone] call 911  Patient verbalizes understanding of instructions and care plan provided today and agrees to view in MyChart. Active MyChart status and patient understanding of how to access instructions and care plan via MyChart confirmed with patient.     Telephone follow up appointment with care management team member scheduled for:  02/02/2023 at 12:00 pm.  Danford Bad, BSW, MSW, LCSW  Embedded Practice Social Work Case Manager  [facility], Population Health Direct Dial: [phone]  Fax: [phone] Email: [email or web address]

## 2023-01-19 ENCOUNTER — Telehealth: Payer: Self-pay | Admitting: Family Medicine

## 2023-01-19 NOTE — Telephone Encounter (Signed)
Patient calling says she was told she had a cyst on her ovary but says she doesn't have ovaries. Please advise, thank you

## 2023-01-20 NOTE — Telephone Encounter (Signed)
Pt informed of results.

## 2023-01-20 NOTE — Telephone Encounter (Addendum)
Imaging studies completed on January 06, 2023, revealed multiple cysts in the right ovary, with the largest measuring up to 2.5 cm.

## 2023-02-02 ENCOUNTER — Encounter: Payer: Self-pay | Admitting: *Deleted

## 2023-02-02 ENCOUNTER — Ambulatory Visit: Payer: Self-pay | Admitting: *Deleted

## 2023-02-02 NOTE — Patient Outreach (Signed)
Care Coordination   Follow Up Visit Note   02/02/2023  Name: Gloria Owens MRN: [id number] DOB: 02-27-1936  Gloria Owens is a 87 y.o. year old female who sees Gloria Laroche, FNP for primary care. I spoke with Gloria Owens by phone today.  What matters to the patients health and wellness today?  Provide Engineer, technical sales & Resources.    Goals Addressed               This Visit's Progress     Provide Caregiver Services & Resources. (pt-stated)   On track     Care Coordination Interventions:  Interventions Today    Flowsheet Row Most Recent Value  Chronic Disease   Chronic disease during today's visit Diabetes, Hypertension (HTN), Other  [Caregiver Stress & Fatigue]  General Interventions   General Interventions Discussed/Reviewed General Interventions Discussed, Labs, Vaccines, Doctor Visits, Health Screening, Annual Foot Exam, Lipid Profile, General Interventions Reviewed, Annual Eye Exam, Durable Medical Equipment (DME), Community Resources, Level of Care, Communication with  [Encouraged]  Labs Hgb A1c every 3 months, Kidney Function  [Encouraged]  Vaccines COVID-19, Flu, Pneumonia, RSV, Shingles, Tetanus/Pertussis/Diphtheria  [Encouraged]  Doctor Visits Discussed/Reviewed Doctor Visits Discussed, Specialist, Doctor Visits Reviewed, Annual Wellness Visits, PCP  [Encouraged]  Health Screening Bone Density, Colonoscopy, Mammogram  [Encouraged]  Durable Medical Equipment (DME) Glucomoter, BP Cuff, Other  [Cane & Eyeglasses]  PCP/Specialist Visits Compliance with follow-up visit  [Encouraged]  Communication with PCP/Specialists, RN  [Encouraged]  Level of Care Adult Daycare, Personal Care Services, Applications, Assisted Living, Skilled Nursing Facility  [Encouraged]  Applications Medicaid, Personal Care Services, FL-2  [Encouraged]  Exercise Interventions   Exercise Discussed/Reviewed Exercise Discussed, Assistive device use and maintanence, Exercise Reviewed, Physical  Activity, Weight Managment  [Encouraged]  Physical Activity Discussed/Reviewed Physical Activity Discussed, Home Exercise Program (HEP), Physical Activity Reviewed, Types of exercise  [Encouraged]  Weight Management Weight maintenance  [Encouraged]  Education Interventions   Education Provided Provided Therapist, sports, Provided Web-based Education, Provided Education  [Encouraged]  Provided Verbal Education On Nutrition, Mental Health/Coping with Illness, When to see the doctor, Foot Care, Eye Care, Applications, Labs, Blood Sugar Monitoring, Exercise, Medication, Development worker, community, Clinical research associate, Personal Care Services, FL-2  [Encouraged]  Mental Health Interventions   Mental Health Discussed/Reviewed Mental Health Discussed, Anxiety, Depression, Grief and Loss, Substance Abuse, Mental Health Reviewed, Coping Strategies, Suicide, Other, Crisis  [Domestic Violence]  Nutrition Interventions   Nutrition Discussed/Reviewed Nutrition Discussed, Adding fruits and vegetables, Increasing proteins, Decreasing fats, Decreasing salt, Supplemental nutrition, Decreasing sugar intake, Portion sizes, Carbohydrate meal planning, Fluid intake, Nutrition Reviewed  [Encouraged]  Pharmacy Interventions   Pharmacy Dicussed/Reviewed Pharmacy Topics Discussed, Medications and their functions, Medication Adherence, Affording Medications, Pharmacy Topics Reviewed  [Encouraged]  Safety Interventions   Safety Discussed/Reviewed Safety Discussed, Safety Reviewed  [Encouraged]  Advanced Directive Interventions   Advanced Directives Discussed/Reviewed Advanced Directives Discussed  [Completed]       Active Listening & Reflection Utilized.  Verbalization of Feelings Encouraged.  Emotional Support Provided. Problem Solving Interventions Employed. Task-Centered Solutions Implemented.   Solution-Focused Strategies Activated. Acceptance & Commitment Therapy Conducted. Cognitive  Behavioral Therapy Performed. Client-Centered Therapy Initiated. Encouraged Administration of Medications, Exactly as Prescribed. Encouraged Increased Level of Activity & Exercise, as Tolerated. Encouraged Review of Medicaid Application, Mailed Again on 02/02/2023, Due to Misplacement of Original Application. Encouraged Completion of Medicaid Application for Disabled Brother During Next Scheduled Follow-Up Outreach Call with CSW, & Submission to The [facility]  Department of Social Services [phone]), for Processing. Encouraged Completion of Application for Personal Care Services & Submission to [facility] [phone]) for Processing, Once Disabled Brother is Approved for Medicaid, through The [facility] of Social Services [phone]). Encouraged Attendance at Follow-Up Appointment with Family Nurse Practitioner, Gloria Owens with [facility] [phone]), Scheduled on 02/11/2023 at 2:40 PM. Encouraged Contact with CSW (# [phone]), if You Have Questions, Need Assistance, or If Additional Social Work Needs Are Identified Between Now & Our Next Follow-Up Outreach Call, Scheduled on 02/16/2023 at 1:00 PM. Encouraged Attendance at Follow-Up Appointment with Dr. Wilkie Aye, Urologist with [facility] at [place] [phone]), Scheduled on 02/18/2023 at 1:30 PM. Encouraged Attendance at Follow-Up Appointment with Rojelio Brenner, Physician Assistant with [facility] at [facility] [phone]), Scheduled on 04/21/2023 at 10:00 AM.      SDOH assessments and interventions completed:  Yes.  Care Coordination Interventions:  Yes, provided.   Follow up plan: Follow up call scheduled for 02/16/2023 at 1:00 pm.  Encounter Outcome:  Patient Visit Completed.   Gloria Owens, BSW, MSW, Printmaker Social Work Case Set designer Health  [facility], Population Health Direct Dial: [phone]  Fax: [phone] Email: [email or web address]

## 2023-02-02 NOTE — Patient Instructions (Signed)
Visit Information  Thank you for taking time to visit with me today. Please don't hesitate to contact me if I can be of assistance to you.   Following are the goals we discussed today:   Goals Addressed               This Visit's Progress     Provide Caregiver Services & Resources. (pt-stated)   On track     Care Coordination Interventions:  Interventions Today    Flowsheet Row Most Recent Value  Chronic Disease   Chronic disease during today's visit Diabetes, Hypertension (HTN), Other  [Caregiver Stress & Fatigue]  General Interventions   General Interventions Discussed/Reviewed General Interventions Discussed, Labs, Vaccines, Doctor Visits, Health Screening, Annual Foot Exam, Lipid Profile, General Interventions Reviewed, Annual Eye Exam, Durable Medical Equipment (DME), Community Resources, Level of Care, Communication with  [Encouraged]  Labs Hgb A1c every 3 months, Kidney Function  [Encouraged]  Vaccines COVID-19, Flu, Pneumonia, RSV, Shingles, Tetanus/Pertussis/Diphtheria  [Encouraged]  Doctor Visits Discussed/Reviewed Doctor Visits Discussed, Specialist, Doctor Visits Reviewed, Annual Wellness Visits, PCP  [Encouraged]  Health Screening Bone Density, Colonoscopy, Mammogram  [Encouraged]  Durable Medical Equipment (DME) Glucomoter, BP Cuff, Other  [Cane & Eyeglasses]  PCP/Specialist Visits Compliance with follow-up visit  [Encouraged]  Communication with PCP/Specialists, RN  [Encouraged]  Level of Care Adult Daycare, Personal Care Services, Applications, Assisted Living, Skilled Nursing Facility  [Encouraged]  Applications Medicaid, Personal Care Services, FL-2  [Encouraged]  Exercise Interventions   Exercise Discussed/Reviewed Exercise Discussed, Assistive device use and maintanence, Exercise Reviewed, Physical Activity, Weight Managment  [Encouraged]  Physical Activity Discussed/Reviewed Physical Activity Discussed, Home Exercise Program (HEP), Physical Activity Reviewed,  Types of exercise  [Encouraged]  Weight Management Weight maintenance  [Encouraged]  Education Interventions   Education Provided Provided Therapist, sports, Provided Web-based Education, Provided Education  [Encouraged]  Provided Verbal Education On Nutrition, Mental Health/Coping with Illness, When to see the doctor, Foot Care, Eye Care, Applications, Labs, Blood Sugar Monitoring, Exercise, Medication, Development worker, community, Clinical research associate, Personal Care Services, FL-2  [Encouraged]  Mental Health Interventions   Mental Health Discussed/Reviewed Mental Health Discussed, Anxiety, Depression, Grief and Loss, Substance Abuse, Mental Health Reviewed, Coping Strategies, Suicide, Other, Crisis  [Domestic Violence]  Nutrition Interventions   Nutrition Discussed/Reviewed Nutrition Discussed, Adding fruits and vegetables, Increasing proteins, Decreasing fats, Decreasing salt, Supplemental nutrition, Decreasing sugar intake, Portion sizes, Carbohydrate meal planning, Fluid intake, Nutrition Reviewed  [Encouraged]  Pharmacy Interventions   Pharmacy Dicussed/Reviewed Pharmacy Topics Discussed, Medications and their functions, Medication Adherence, Affording Medications, Pharmacy Topics Reviewed  [Encouraged]  Safety Interventions   Safety Discussed/Reviewed Safety Discussed, Safety Reviewed  [Encouraged]  Advanced Directive Interventions   Advanced Directives Discussed/Reviewed Advanced Directives Discussed  [Completed]       Active Listening & Reflection Utilized.  Verbalization of Feelings Encouraged.  Emotional Support Provided. Problem Solving Interventions Employed. Task-Centered Solutions Implemented.   Solution-Focused Strategies Activated. Acceptance & Commitment Therapy Conducted. Cognitive Behavioral Therapy Performed. Client-Centered Therapy Initiated. Encouraged Administration of Medications, Exactly as Prescribed. Encouraged Increased Level of  Activity & Exercise, as Tolerated. Encouraged Review of Medicaid Application, Mailed Again on 02/02/2023, Due to Misplacement of Original Application. Encouraged Completion of Medicaid Application for Disabled Brother During Next Scheduled Follow-Up Outreach Call with CSW, & Submission to [facility] of Social Services [phone]), for Processing. Encouraged Completion of Application for Personal Care Services & Submission to [facility] [phone]) for Processing, Once Disabled Brother is  Approved for Medicaid, through The [facility] of Social Services [phone]). Encouraged Attendance at Follow-Up Appointment with Family Nurse Practitioner, Gilmore Laroche with [facility] [phone]), Scheduled on 02/11/2023 at 2:40 PM. Encouraged Contact with CSW (# [phone]), if You Have Questions, Need Assistance, or If Additional Social Work Needs Are Identified Between Now & Our Next Follow-Up Outreach Call, Scheduled on 02/16/2023 at 1:00 PM. Encouraged Attendance at Follow-Up Appointment with Dr. Wilkie Aye, Urologist with [facility] at [place] [phone]), Scheduled on 02/18/2023 at 1:30 PM. Encouraged Attendance at Follow-Up Appointment with Rojelio Brenner, Physician Assistant with [facility] at [facility] [phone]), Scheduled on 04/21/2023 at 10:00 AM.      Our next appointment is by telephone on 02/16/2023 at 1:00 pm.  Please call the care guide team at [phone] if you need to cancel or reschedule your appointment.   If you are experiencing a Mental Health or Behavioral Health Crisis or need someone to talk to, please call the Suicide and Crisis Lifeline: 988 call the [place] National Suicide Prevention Lifeline: 503-045-3896 or TTY: 6416174743 TTY (785) 800-0978) to talk to a trained counselor call 1-800-273-TALK (toll free, 24 hour hotline) go  to [facility] [address], [place] [phone]) call the [facility] Crisis Line: [phone] call 911  Patient verbalizes understanding of instructions and care plan provided today and agrees to view in MyChart. Active MyChart status and patient understanding of how to access instructions and care plan via MyChart confirmed with patient.     Telephone follow up appointment with care management team member scheduled for:  02/16/2023 at 1:00 pm.  Danford Bad, BSW, MSW, LCSW  Embedded Practice Social Work Case Manager  [facility], Population Health Direct Dial: [phone]  Fax: [phone] Email: [email or web address] Website: [email or web address]

## 2023-02-11 ENCOUNTER — Ambulatory Visit (INDEPENDENT_AMBULATORY_CARE_PROVIDER_SITE_OTHER): Payer: Medicare Other | Admitting: Family Medicine

## 2023-02-11 ENCOUNTER — Encounter: Payer: Self-pay | Admitting: Family Medicine

## 2023-02-11 VITALS — BP 132/77 | HR 82 | Ht 65.0 in | Wt 142.0 lb

## 2023-02-11 DIAGNOSIS — E038 Other specified hypothyroidism: Secondary | ICD-10-CM

## 2023-02-11 DIAGNOSIS — E119 Type 2 diabetes mellitus without complications: Secondary | ICD-10-CM | POA: Diagnosis not present

## 2023-02-11 DIAGNOSIS — R630 Anorexia: Secondary | ICD-10-CM

## 2023-02-11 DIAGNOSIS — E559 Vitamin D deficiency, unspecified: Secondary | ICD-10-CM

## 2023-02-11 DIAGNOSIS — N3946 Mixed incontinence: Secondary | ICD-10-CM

## 2023-02-11 DIAGNOSIS — Z7984 Long term (current) use of oral hypoglycemic drugs: Secondary | ICD-10-CM

## 2023-02-11 DIAGNOSIS — E1165 Type 2 diabetes mellitus with hyperglycemia: Secondary | ICD-10-CM | POA: Diagnosis not present

## 2023-02-11 DIAGNOSIS — I10 Essential (primary) hypertension: Secondary | ICD-10-CM

## 2023-02-11 DIAGNOSIS — R351 Nocturia: Secondary | ICD-10-CM

## 2023-02-11 DIAGNOSIS — E7849 Other hyperlipidemia: Secondary | ICD-10-CM

## 2023-02-11 DIAGNOSIS — R32 Unspecified urinary incontinence: Secondary | ICD-10-CM | POA: Insufficient documentation

## 2023-02-11 DIAGNOSIS — R7301 Impaired fasting glucose: Secondary | ICD-10-CM

## 2023-02-11 MED ORDER — OLMESARTAN MEDOXOMIL 20 MG PO TABS: 20.0000 mg | ORAL_TABLET | Freq: Every day | ORAL | 1 refills | Status: DC

## 2023-02-11 MED ORDER — METFORMIN HCL 500 MG PO TABS
500.0000 mg | ORAL_TABLET | Freq: Two times a day (BID) | ORAL | 0 refills | Status: DC
Start: 2023-02-11 — End: 2023-06-16

## 2023-02-11 MED ORDER — GLUCERNA SHAKE PO LIQD: 237.0000 mL | Freq: Three times a day (TID) | ORAL | 2 refills | Status: AC

## 2023-02-11 MED ORDER — CALCIUM CARB-CHOLECALCIFEROL 600-20 MG-MCG PO TABS
600.0000 mg | ORAL_TABLET | Freq: Two times a day (BID) | ORAL | 4 refills | Status: DC
Start: 2023-02-11 — End: 2023-10-27

## 2023-02-11 MED ORDER — SIMVASTATIN 40 MG PO TABS
40.0000 mg | ORAL_TABLET | Freq: Every day | ORAL | 2 refills | Status: DC
Start: 2023-02-11 — End: 2023-12-23

## 2023-02-11 NOTE — Patient Instructions (Addendum)
I appreciate the opportunity to provide care to you today!    Follow up:  4 months  Fasting Labs: please stop by the lab during the week to get your blood drawn (CBC, CMP, TSH, Lipid profile, HgA1c, Vit D)  Management Plan for Urinary Incontinence -We will provide you with a sample of Myrbetriq 25 mg to be taken daily to assist with your urinary incontinence. I recommend following up with urology as scheduled. -Please inform me once you have completed the sample of Myrbetriq, so that I can send in a prescription if your insurance covers the medication.  Additionally, I suggest the following dietary modifications: -Reduce intake of caffeine, alcohol, and artificial sweeteners, as these can irritate the bladder. -Monitor your fluid intake to avoid excessive drinking prior to activities.   Please continue to a heart-healthy diet and increase your physical activities. Try to exercise for at least five days a week.    It was a pleasure to see you and I look forward to continuing to work together on your health and well-being. Please do not hesitate to call the office if you need care or have questions about your care.  In case of emergency, please visit the Emergency Department for urgent care, or contact our clinic at [phone] to schedule an appointment. We're here to help you!   Have a wonderful day and week. With Gratitude, Gilmore Laroche MSN, FNP-BC

## 2023-02-11 NOTE — Progress Notes (Signed)
Established Patient Office Visit  Subjective:  Patient ID: Gloria Owens, female    DOB: December 08, 1935  Age: 88 y.o. MRN: [id number]  CC:  Chief Complaint  Patient presents with   Care Management    3 month f/u   Results    Pt worried about ct/abdomen US results. States someone called and said she needed surgery.     HPI Gloria Owens is a 87 y.o. female with past medical history of hypertension, hyperlipidemia, type 2 diabetes, urinary continence presents for f/u of  chronic medical conditions.  Hypertension: The patient is taking olmesartan 20 mg daily and reports full compliance with her treatment. She denies experiencing any headaches, dizziness, blurred vision, chest pain, or palpitations.  Hyperlipidemia: The patient is taking simvastatin 40 mg daily and also reports compliance with her treatment regimen.  Type 2 Diabetes: The patient is on metformin 500 mg daily and confirms adherence to her treatment plan.  Urinary Incontinence: The patient reports experiencing involuntary leakage of urine, often being unaware of when she urinates. She notes that she saturates her pads quickly and changes them multiple times daily. The patient denies any fever, dysuria, urinary odor, or suprapubic pain. She has a follow-up appointment with urology and is currently taking a prophylactic antibiotic for recurrent urinary tract infections.  Past Medical History:  Diagnosis Date   Hypercholesteremia    Seizures (HCC)    with eye surgery several years ago-unknown etiology-no meds    Past Surgical History:  Procedure Laterality Date   ABDOMINAL HYSTERECTOMY     total hysterectomy   BACK SURGERY     fusion of back x2 and 1 other back surgery   CHOLECYSTECTOMY     COLON RESECTION     from endometriosis   CYSTOSCOPY     fulgeration of tumors   HERNIA REPAIR Right    INGUINAL HERNIA REPAIR Left 06/20/2013   Procedure: HERNIA REPAIR INGUINAL ADULT;  Surgeon: Dalia Heading, MD;  Location: [facility];  Service: General;  Laterality: Left;   INSERTION OF MESH Left 06/20/2013   Procedure: INSERTION OF MESH;  Surgeon: Dalia Heading, MD;  Location: [facility];  Service: General;  Laterality: Left;    History reviewed. No pertinent family history.  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  Tobacco Use   Smoking status: Never    Passive exposure: Never   Smokeless tobacco: Never  Vaping Use   Vaping status: Never Used  Substance and Sexual Activity   Alcohol use: No   Drug use: No   Sexual activity: Not Currently    Partners: Male    Birth control/protection: Surgical  Other Topics Concern   Not on file  Social History Narrative   No children- had endometriosis an dhysterectomy; husband recently deceased   Nephew is Deaysia Aragon- security guard at [employer]      11/24/22 patient caring for 1 brother who has Alzheimer and another brother is in a facility   Social Determinants of Health   Financial Resource Strain: Low Risk  (12/17/2022)   Overall Financial Resource Strain (CARDIA)    Difficulty of Paying Living Expenses: Not hard at all  Food Insecurity: No Food Insecurity (12/17/2022)  Hunger Vital Sign    Worried About Running Out of Food in the Last Year: Never true    Ran Out of Food in the Last Year: Never true  Transportation Needs: No Transportation Needs (12/17/2022)   PRAPARE - Administrator, Civil Service (Medical): No    Lack of Transportation (Non-Medical): No  Physical Activity: Sufficiently Active (12/17/2022)   Exercise Vital Sign    Days of Exercise per Week: 7 days    Minutes of Exercise per Session: 30 min  Stress: No Stress Concern Present (12/17/2022)   [employer] of Occupational Health - Occupational Stress  Questionnaire    Feeling of Stress : Not at all  Social Connections: Moderately Integrated (12/17/2022)   Social Connection and Isolation Panel [NHANES]    Frequency of Communication with Friends and Family: More than three times a week    Frequency of Social Gatherings with Friends and Family: More than three times a week    Attends Religious Services: More than 4 times per year    Active Member of [employer] or Organizations: Yes    Attends Banker Meetings: More than 4 times per year    Marital Status: Widowed  Intimate Partner Violence: Not At Risk (12/17/2022)   Humiliation, Afraid, Rape, and Kick questionnaire    Fear of Current or Ex-Partner: No    Emotionally Abused: No    Physically Abused: No    Sexually Abused: No    Outpatient Medications Prior to Visit  Medication Sig Dispense Refill   aspirin EC 81 MG tablet Take 81 mg by mouth daily.     cholecalciferol (VITAMIN D3) 25 MCG (1000 UNIT) tablet Take 2,000 Units by mouth daily.     Iron, Ferrous Sulfate, 325 (65 Fe) MG TABS Take 325 mg by mouth daily. 30 tablet 2   Calcium Carb-Cholecalciferol (CALTRATE 600+D3) 600-20 MG-MCG TABS Take 600 mg by mouth 2 (two) times daily. 60 tablet 4   feeding supplement, GLUCERNA SHAKE, (GLUCERNA SHAKE) LIQD Take 237 mLs by mouth 3 (three) times daily between meals. 30 mL 2   hydrOXYzine (VISTARIL) 25 MG capsule Take 1 capsule (25 mg total) by mouth at bedtime. 30 capsule 0   metFORMIN (GLUCOPHAGE) 500 MG tablet Take 1 tablet (500 mg total) by mouth 2 (two) times daily with a meal. 180 tablet 0   mirabegron ER (MYRBETRIQ) 25 MG TB24 tablet Take 1 tablet (25 mg total) by mouth daily. 30 tablet 0   nitrofurantoin (MACRODANTIN) 50 MG capsule Take 1 capsule (50 mg total) by mouth at bedtime. 30 capsule 11   olmesartan (BENICAR) 20 MG tablet Take 1 tablet (20 mg total) by mouth daily. 90 tablet 1   Semaglutide (RYBELSUS) 3 MG TABS Take 1 tablet (3 mg total) by mouth daily. 30 tablet 2    simvastatin (ZOCOR) 40 MG tablet TAKE ONE TABLET BY MOUTH ONCE DAILY. 90 tablet 0   sulfamethoxazole-trimethoprim (BACTRIM DS) 800-160 MG tablet Take 1 tablet by mouth 2 (two) times daily. 14 tablet 0   sulfamethoxazole-trimethoprim (BACTRIM DS) 800-160 MG tablet Take 1 tablet by mouth every 12 (twelve) hours. 14 tablet 5   No facility-administered medications prior to visit.    Allergies  Allergen Reactions   Penicillins     ROS Review of Systems  Constitutional:  Negative for chills and fever.  Eyes:  Negative for visual disturbance.  Respiratory:  Negative for chest tightness and shortness of breath.   Genitourinary:  Involuntary leakage of urine  Neurological:  Negative for dizziness and headaches.      Objective:    Physical Exam HENT:     Head: Normocephalic.     Mouth/Throat:     Mouth: Mucous membranes are moist.  Cardiovascular:     Rate and Rhythm: Normal rate.     Heart sounds: Normal heart sounds.  Pulmonary:     Effort: Pulmonary effort is normal.     Breath sounds: Normal breath sounds.  Abdominal:     Tenderness: There is no abdominal tenderness. There is no right CVA tenderness or left CVA tenderness.  Neurological:     Mental Status: She is alert.     BP 132/77   Pulse 82   Ht 5\' 5"  (1.651 m)   Wt 142 lb 0.6 oz (64.4 kg)   SpO2 95%   BMI 23.64 kg/m  Wt Readings from Last 3 Encounters:  02/11/23 142 lb 0.6 oz (64.4 kg)  01/14/23 144 lb 8 oz (65.5 kg)  12/31/22 143 lb 11.8 oz (65.2 kg)    Lab Results  Component Value Date   TSH 1.900 02/12/2023   Lab Results  Component Value Date   WBC 4.7 02/12/2023   HGB 13.2 02/12/2023   HCT 40.2 02/12/2023   MCV 97 02/12/2023   PLT 119 (L) 02/12/2023   Lab Results  Component Value Date   NA 139 02/12/2023   K 4.1 02/12/2023   CO2 22 02/12/2023   GLUCOSE 181 (H) 02/12/2023   BUN 15 02/12/2023   CREATININE 0.67 02/12/2023   BILITOT 0.6 02/12/2023   ALKPHOS 55 02/12/2023   AST 19  02/12/2023   ALT 13 02/12/2023   PROT 6.4 02/12/2023   ALBUMIN 4.2 02/12/2023   CALCIUM 9.8 02/12/2023   ANIONGAP 8 12/31/2022   EGFR 86 02/12/2023   Lab Results  Component Value Date   CHOL 141 02/12/2023   Lab Results  Component Value Date   HDL 56 02/12/2023   Lab Results  Component Value Date   LDLCALC 66 02/12/2023   Lab Results  Component Value Date   TRIG 103 02/12/2023   Lab Results  Component Value Date   CHOLHDL 2.5 02/12/2023   Lab Results  Component Value Date   HGBA1C 6.5 (H) 02/12/2023      Assessment & Plan:  Hypertension, essential Assessment & Plan: Controlled She takes olmesartan 20mg  daily Encouraged low-sodium diet with increased physical activity BP Readings from Last 3 Encounters:  02/11/23 132/77  01/14/23 (!) 144/64  12/31/22 (!) 141/67   Controlled Encouraged to continue taking olmesartan 20mg  daily Low-sodium diet increase physical activity encouraged BP Readings from Last 3 Encounters:  02/11/23 132/77  01/14/23 (!) 144/64  12/31/22 (!) 141/67     Orders: -     Olmesartan Medoxomil; Take 1 tablet (20 mg total) by mouth daily.  Dispense: 90 tablet; Refill: 1  Uncontrolled type 2 diabetes mellitus with hyperglycemia (HCC) Assessment & Plan: Encouraged to continue taking metformin 500 mg daily Encouraged to avoid high sugar foods and beverages with increased physical activity Lab Results  Component Value Date   HGBA1C 6.5 (H) 02/12/2023     Orders: -     metFORMIN HCl; Take 1 tablet (500 mg total) by mouth 2 (two) times daily with a meal.  Dispense: 180 tablet; Refill: 0  Other hyperlipidemia Assessment & Plan: Encouraged to continue taking simvastatin 40 mg daily and avoid greasy, starchy, fatty foods with increased physical activity  Lab Results  Component Value Date   CHOL 141 02/12/2023   HDL 56 02/12/2023   LDLCALC 66 02/12/2023   TRIG 103 02/12/2023   CHOLHDL 2.5 02/12/2023      Orders: -     Simvastatin;  Take 1 tablet (40 mg total) by mouth daily.  Dispense: 90 tablet; Refill: 2 -     Lipid panel -     CMP14+EGFR -     CBC with Differential/Platelet  Type 2 diabetes mellitus without complication, without long-term current use of insulin (HCC) Assessment & Plan: Encouraged to continue taking metformin 500 mg daily Encouraged to avoid high sugar foods and beverages with increased physical activity Lab Results  Component Value Date   HGBA1C 6.5 (H) 02/12/2023     Orders: -     metFORMIN HCl; Take 1 tablet (500 mg total) by mouth 2 (two) times daily with a meal.  Dispense: 180 tablet; Refill: 0  Mixed stress and urge urinary incontinence Assessment & Plan: - provided a sample of Myrbetriq 25 mg to be taken daily to assist with urinary incontinence.  -I recommend following up with urology as scheduled. -Encouraged to inform me once she has completed the sample of Myrbetriq and if the medication helped relieve her symptoms    IFG (impaired fasting glucose) -     Hemoglobin A1c  Vitamin D deficiency -     VITAMIN D 25 Hydroxy (Vit-D Deficiency, Fractures)  Other specified hypothyroidism -     TSH + free T4  Poor appetite -     Glucerna Shake; Take 237 mLs by mouth 3 (three) times daily between meals.  Dispense: 30 mL; Refill: 2  Low calcium levels -     Calcium Carb-Cholecalciferol; Take 600 mg by mouth 2 (two) times daily.  Dispense: 60 tablet; Refill: 4   Note: This chart has been completed using Engineer, civil (consulting) software, and while attempts have been made to ensure accuracy, certain words and phrases may not be transcribed as intended.   Follow-up: Return in about 4 months (around 06/13/2023).   Gilmore Laroche, FNP

## 2023-02-11 NOTE — Assessment & Plan Note (Addendum)
Encouraged to continue taking metformin 500 mg daily Encouraged to avoid high sugar foods and beverages with increased physical activity Lab Results  Component Value Date   HGBA1C 6.5 (H) 02/12/2023

## 2023-02-11 NOTE — Assessment & Plan Note (Addendum)
Controlled She takes olmesartan 20mg  daily Encouraged low-sodium diet with increased physical activity BP Readings from Last 3 Encounters:  02/11/23 132/77  01/14/23 (!) 144/64  12/31/22 (!) 141/67   Controlled Encouraged to continue taking olmesartan 20mg  daily Low-sodium diet increase physical activity encouraged BP Readings from Last 3 Encounters:  02/11/23 132/77  01/14/23 (!) 144/64  12/31/22 (!) 141/67

## 2023-02-11 NOTE — Assessment & Plan Note (Addendum)
-   provided a sample of Myrbetriq 25 mg to be taken daily to assist with urinary incontinence.  -I recommend following up with urology as scheduled. -Encouraged to inform me once she has completed the sample of Myrbetriq and if the medication helped relieve her symptoms

## 2023-02-12 DIAGNOSIS — E7849 Other hyperlipidemia: Secondary | ICD-10-CM | POA: Diagnosis not present

## 2023-02-12 DIAGNOSIS — E038 Other specified hypothyroidism: Secondary | ICD-10-CM | POA: Diagnosis not present

## 2023-02-12 DIAGNOSIS — R7301 Impaired fasting glucose: Secondary | ICD-10-CM | POA: Diagnosis not present

## 2023-02-12 DIAGNOSIS — E559 Vitamin D deficiency, unspecified: Secondary | ICD-10-CM | POA: Diagnosis not present

## 2023-02-13 LAB — CBC WITH DIFFERENTIAL/PLATELET
Basophils Absolute: 0 10*3/uL (ref 0.0–0.2)
Basos: 0 %
EOS (ABSOLUTE): 0.1 10*3/uL (ref 0.0–0.4)
Eos: 3 %
Hematocrit: 40.2 % (ref 34.0–46.6)
Hemoglobin: 13.2 g/dL (ref 11.1–15.9)
Immature Grans (Abs): 0 10*3/uL (ref 0.0–0.1)
Immature Granulocytes: 0 %
Lymphocytes Absolute: 1 10*3/uL (ref 0.7–3.1)
Lymphs: 22 %
MCH: 32 pg (ref 26.6–33.0)
MCHC: 32.8 g/dL (ref 31.5–35.7)
MCV: 97 fL (ref 79–97)
Monocytes Absolute: 0.4 10*3/uL (ref 0.1–0.9)
Monocytes: 8 %
Neutrophils Absolute: 3.1 10*3/uL (ref 1.4–7.0)
Neutrophils: 67 %
Platelets: 119 10*3/uL — ABNORMAL LOW (ref 150–450)
RBC: 4.13 x10E6/uL (ref 3.77–5.28)
RDW: 14.5 % (ref 11.7–15.4)
WBC: 4.7 10*3/uL (ref 3.4–10.8)

## 2023-02-13 LAB — CMP14+EGFR
ALT: 13 IU/L (ref 0–32)
AST: 19 IU/L (ref 0–40)
Albumin: 4.2 g/dL (ref 3.7–4.7)
Alkaline Phosphatase: 55 IU/L (ref 44–121)
BUN/Creatinine Ratio: 22 (ref 12–28)
BUN: 15 mg/dL (ref 8–27)
Bilirubin Total: 0.6 mg/dL (ref 0.0–1.2)
CO2: 22 mmol/L (ref 20–29)
Calcium: 9.8 mg/dL (ref 8.7–10.3)
Chloride: 102 mmol/L (ref 96–106)
Creatinine, Ser: 0.67 mg/dL (ref 0.57–1.00)
Globulin, Total: 2.2 g/dL (ref 1.5–4.5)
Glucose: 181 mg/dL — ABNORMAL HIGH (ref 70–99)
Potassium: 4.1 mmol/L (ref 3.5–5.2)
Sodium: 139 mmol/L (ref 134–144)
Total Protein: 6.4 g/dL (ref 6.0–8.5)
eGFR: 86 mL/min/{1.73_m2} (ref >59)

## 2023-02-13 LAB — TSH+FREE T4
Free T4: 1.14 ng/dL (ref 0.82–1.77)
TSH: 1.9 u[IU]/mL (ref 0.450–4.500)

## 2023-02-13 LAB — LIPID PANEL
Chol/HDL Ratio: 2.5 ratio (ref 0.0–4.4)
Cholesterol, Total: 141 mg/dL (ref 100–199)
HDL: 56 mg/dL (ref >39)
LDL Chol Calc (NIH): 66 mg/dL (ref 0–99)
Triglycerides: 103 mg/dL (ref 0–149)
VLDL Cholesterol Cal: 19 mg/dL (ref 5–40)

## 2023-02-13 LAB — HEMOGLOBIN A1C
Est. average glucose Bld gHb Est-mCnc: 140 mg/dL
Hgb A1c MFr Bld: 6.5 % — ABNORMAL HIGH (ref 4.8–5.6)

## 2023-02-13 LAB — VITAMIN D 25 HYDROXY (VIT D DEFICIENCY, FRACTURES): Vit D, 25-Hydroxy: 54.9 ng/mL (ref 30.0–100.0)

## 2023-02-13 NOTE — Assessment & Plan Note (Signed)
Encouraged to continue taking simvastatin 40 mg daily and avoid greasy, starchy, fatty foods with increased physical activity Lab Results  Component Value Date   CHOL 141 02/12/2023   HDL 56 02/12/2023   LDLCALC 66 02/12/2023   TRIG 103 02/12/2023   CHOLHDL 2.5 02/12/2023

## 2023-02-14 NOTE — Progress Notes (Signed)
Please inform the patient that her hemoglobin A1c has decreased from 7.2 to 6.5 . Great efforts have been made, and I recommend continuing her current treatment regimen. All other lab results are stable.

## 2023-02-16 ENCOUNTER — Ambulatory Visit: Payer: Self-pay | Admitting: *Deleted

## 2023-02-16 NOTE — Patient Outreach (Signed)
Care Coordination   Follow Up Visit Note   02/16/2023  Name: Gloria Owens MRN: [id number] DOB: 1935/10/11  Gloria Owens is a 87 y.o. year old female who sees Gilmore Laroche, FNP for primary care. I spoke with Gloria Owens by phone today.  What matters to the patients health and wellness today?  Provide Engineer, technical sales & Resources.    Goals Addressed               This Visit's Progress     Provide Caregiver Services & Resources. (pt-stated)   On track     Care Coordination Interventions:  Interventions Today    Flowsheet Row Most Recent Value  Chronic Disease   Chronic disease during today's visit Diabetes, Hypertension (HTN), Other  [Caregiver Stress & Fatigue]  General Interventions   General Interventions Discussed/Reviewed General Interventions Discussed, Labs, Vaccines, Doctor Visits, Health Screening, Annual Foot Exam, Lipid Profile, General Interventions Reviewed, Annual Eye Exam, Durable Medical Equipment (DME), Community Resources, Level of Care, Communication with  [Encouraged]  Labs Hgb A1c every 3 months, Kidney Function  [Encouraged]  Vaccines COVID-19, Flu, Pneumonia, RSV, Shingles, Tetanus/Pertussis/Diphtheria  [Encouraged]  Doctor Visits Discussed/Reviewed Doctor Visits Discussed, Specialist, Doctor Visits Reviewed, Annual Wellness Visits, PCP  [Encouraged]  Health Screening Bone Density, Colonoscopy, Mammogram  [Encouraged]  Durable Medical Equipment (DME) Glucomoter, BP Cuff, Other  [Cane & Eyeglasses]  PCP/Specialist Visits Compliance with follow-up visit  [Encouraged]  Communication with PCP/Specialists, RN  [Encouraged]  Level of Care Adult Daycare, Personal Care Services, Applications, Assisted Living, Skilled Nursing Facility  [Encouraged]  Applications Medicaid, Personal Care Services, [place]-2  [Encouraged]  Exercise Interventions   Exercise Discussed/Reviewed Exercise Discussed, Assistive device use and maintanence, Exercise Reviewed, Physical  Activity, Weight Managment  [Encouraged]  Physical Activity Discussed/Reviewed Physical Activity Discussed, Home Exercise Program (HEP), Physical Activity Reviewed, Types of exercise  [Encouraged]  Weight Management Weight maintenance  [Encouraged]  Education Interventions   Education Provided Provided Therapist, sports, Provided Web-based Education, Provided Education  [Encouraged]  Provided Verbal Education On Nutrition, Mental Health/Coping with Illness, When to see the doctor, Foot Care, Eye Care, Applications, Labs, Blood Sugar Monitoring, Exercise, Medication, Development worker, community, Clinical research associate, Personal Care Services, [place]-2  [Encouraged]  Mental Health Interventions   Mental Health Discussed/Reviewed Mental Health Discussed, Anxiety, Depression, Grief and Loss, Substance Abuse, Mental Health Reviewed, Coping Strategies, Suicide, Other, Crisis  [Domestic Violence]  Nutrition Interventions   Nutrition Discussed/Reviewed Nutrition Discussed, Adding fruits and vegetables, Increasing proteins, Decreasing fats, Decreasing salt, Supplemental nutrition, Decreasing sugar intake, Portion sizes, Carbohydrate meal planning, Fluid intake, Nutrition Reviewed  [Encouraged]  Pharmacy Interventions   Pharmacy Dicussed/Reviewed Pharmacy Topics Discussed, Medications and their functions, Medication Adherence, Affording Medications, Pharmacy Topics Reviewed  [Encouraged]  Safety Interventions   Safety Discussed/Reviewed Safety Discussed, Safety Reviewed  [Encouraged]  Advanced Directive Interventions   Advanced Directives Discussed/Reviewed Advanced Directives Discussed  [Completed]       Active Listening & Reflection Utilized.  Verbalization of Feelings Encouraged.  Emotional Support Provided. Feelings of Contentment, Happiness & Joy Acknowledged. Caregiver Stress, Burnout, & Fatigue Validated. Problem Solving Interventions Conducted. Task-Centered Solutions  Activated.   Solution-Focused Strategies Employed. Acceptance & Commitment Therapy Initiated. Cognitive Behavioral Therapy Indicated. Client-Centered Therapy Performed. Encouraged Daily Implementation of Deep Breathing Exercises, Relaxation Techniques, & Mindfulness Meditation Strategies. Encouraged Routine Engagement in Activities of Interest, Inside & Outside the Home. Encouraged Administration of Medications, Exactly as Prescribed. Encouraged Increased Level of Activity & Exercise, as  Tolerated. Confirmed Receipt & Thoroughly Reviewed Medicaid Application for Disabled Brother, Offering Assistance with Completion & Submission to [facility] of Social Services [phone]) for Processing. Confirmed Receipt & Thoroughly Reviewed Personal Care Services Application for Disabled Brother, Offering Assistance with Completion & Submission to [employer] [phone]) for Processing, Once Approved for Medicaid, through [facility] of Social Services [phone]). Encouraged Contact with CSW (# [phone]), if You Have Questions, Need Assistance, or If Additional Social Work Needs Are Identified Between Now & Our Next Follow-Up Outreach Call, Scheduled on 03/03/2023 at 11:00 AM. Encouraged Attendance at Follow-Up Appointment with Dr. Wilkie Aye, Urologist with [facility] [place] [phone]), Scheduled on 02/18/2023 at 1:30 PM. Encouraged Attendance at Follow-Up Appointment with Phlebotomy at [facility] [phone]), Scheduled on 04/14/2023 at 10:15 AM. Encouraged Attendance at Follow-Up Appointment with Rojelio Brenner, Physician Assistant with [facility] [phone]), Scheduled on 04/21/2023 at 10:00 AM. Encouraged Attendance at Follow-Up Appointment with Gilmore Laroche, Family Nurse Practitioner with [facility] [phone]), Scheduled on 06/15/2023 at 2:40 PM.      SDOH assessments and interventions completed:  Yes.  Care Coordination Interventions:  Yes, provided.   Follow up plan: Follow up call scheduled for 03/03/2023 at 11:00 am.  Encounter Outcome:  Patient Visit Completed.   Danford Bad, BSW, MSW, Printmaker Social Work Case Set designer Health  [facility], Population Health Direct Dial: [phone]  Fax: [phone] Email: [email or web address]: [email or web address]

## 2023-02-16 NOTE — Patient Instructions (Signed)
Visit Information  Thank you for taking time to visit with me today. Please don't hesitate to contact me if I can be of assistance to you.   Following are the goals we discussed today:   Goals Addressed               This Visit's Progress     Provide Caregiver Services & Resources. (pt-stated)   On track     Care Coordination Interventions:  Interventions Today    Flowsheet Row Most Recent Value  Chronic Disease   Chronic disease during today's visit Diabetes, Hypertension (HTN), Other  [Caregiver Stress & Fatigue]  General Interventions   General Interventions Discussed/Reviewed General Interventions Discussed, Labs, Vaccines, Doctor Visits, Health Screening, Annual Foot Exam, Lipid Profile, General Interventions Reviewed, Annual Eye Exam, Durable Medical Equipment (DME), Community Resources, Level of Care, Communication with  [Encouraged]  Labs Hgb A1c every 3 months, Kidney Function  [Encouraged]  Vaccines COVID-19, Flu, Pneumonia, RSV, Shingles, Tetanus/Pertussis/Diphtheria  [Encouraged]  Doctor Visits Discussed/Reviewed Doctor Visits Discussed, Specialist, Doctor Visits Reviewed, Annual Wellness Visits, PCP  [Encouraged]  Health Screening Bone Density, Colonoscopy, Mammogram  [Encouraged]  Durable Medical Equipment (DME) Glucomoter, BP Cuff, Other  [Cane & Eyeglasses]  PCP/Specialist Visits Compliance with follow-up visit  [Encouraged]  Communication with PCP/Specialists, RN  [Encouraged]  Level of Care Adult Daycare, Personal Care Services, Applications, Assisted Living, Skilled Nursing Facility  [Encouraged]  Applications Medicaid, Personal Care Services, [place]-2  [Encouraged]  Exercise Interventions   Exercise Discussed/Reviewed Exercise Discussed, Assistive device use and maintanence, Exercise Reviewed, Physical Activity, Weight Managment  [Encouraged]  Physical Activity Discussed/Reviewed Physical Activity Discussed, Home Exercise Program (HEP), Physical Activity Reviewed,  Types of exercise  [Encouraged]  Weight Management Weight maintenance  [Encouraged]  Education Interventions   Education Provided Provided Therapist, sports, Provided Web-based Education, Provided Education  [Encouraged]  Provided Verbal Education On Nutrition, Mental Health/Coping with Illness, When to see the doctor, Foot Care, Eye Care, Applications, Labs, Blood Sugar Monitoring, Exercise, Medication, Development worker, community, Clinical research associate, Personal Care Services, [place]-2  [Encouraged]  Mental Health Interventions   Mental Health Discussed/Reviewed Mental Health Discussed, Anxiety, Depression, Grief and Loss, Substance Abuse, Mental Health Reviewed, Coping Strategies, Suicide, Other, Crisis  [Domestic Violence]  Nutrition Interventions   Nutrition Discussed/Reviewed Nutrition Discussed, Adding fruits and vegetables, Increasing proteins, Decreasing fats, Decreasing salt, Supplemental nutrition, Decreasing sugar intake, Portion sizes, Carbohydrate meal planning, Fluid intake, Nutrition Reviewed  [Encouraged]  Pharmacy Interventions   Pharmacy Dicussed/Reviewed Pharmacy Topics Discussed, Medications and their functions, Medication Adherence, Affording Medications, Pharmacy Topics Reviewed  [Encouraged]  Safety Interventions   Safety Discussed/Reviewed Safety Discussed, Safety Reviewed  [Encouraged]  Advanced Directive Interventions   Advanced Directives Discussed/Reviewed Advanced Directives Discussed  [Completed]       Active Listening & Reflection Utilized.  Verbalization of Feelings Encouraged.  Emotional Support Provided. Feelings of Contentment, Happiness & Joy Acknowledged. Caregiver Stress, Burnout, & Fatigue Validated. Problem Solving Interventions Conducted. Task-Centered Solutions Activated.   Solution-Focused Strategies Employed. Acceptance & Commitment Therapy Initiated. Cognitive Behavioral Therapy Indicated. Client-Centered Therapy  Performed. Encouraged Daily Implementation of Deep Breathing Exercises, Relaxation Techniques, & Mindfulness Meditation Strategies. Encouraged Routine Engagement in Activities of Interest, Inside & Outside the Home. Encouraged Administration of Medications, Exactly as Prescribed. Encouraged Increased Level of Activity & Exercise, as Tolerated. Confirmed Receipt & Thoroughly Reviewed Medicaid Application for Disabled Brother, Offering Assistance with Completion & Submission to [facility] of Social Services [phone]) for Processing.  Confirmed Receipt & Thoroughly Reviewed Personal Care Services Application for Disabled Brother, Offering Assistance with Completion & Submission to [employer] [phone]) for Processing, Once Approved for Medicaid, through [facility] of Social Services [phone]). Encouraged Contact with CSW (# [phone]), if You Have Questions, Need Assistance, or If Additional Social Work Needs Are Identified Between Now & Our Next Follow-Up Outreach Call, Scheduled on 03/03/2023 at 11:00 AM. Encouraged Attendance at Follow-Up Appointment with Dr. Wilkie Aye, Urologist with [facility] at [place] [phone]), Scheduled on 02/18/2023 at 1:30 PM. Encouraged Attendance at Follow-Up Appointment with Phlebotomy at [facility] at [facility] (# [phone]), Scheduled on 04/14/2023 at 10:15 AM. Encouraged Attendance at Follow-Up Appointment with Rojelio Brenner, Physician Assistant with [facility] at [facility] [phone]), Scheduled on 04/21/2023 at 10:00 AM. Encouraged Attendance at Follow-Up Appointment with Gilmore Laroche, Family Nurse Practitioner with [facility] [phone]), Scheduled on 06/15/2023 at 2:40 PM.      Our next appointment is by telephone on 03/03/2023 at 11:00 am.  Please call the care guide  team at [phone] if you need to cancel or reschedule your appointment.   If you are experiencing a Mental Health or Behavioral Health Crisis or need someone to talk to, please call the Suicide and Crisis Lifeline: 988 call the [place] National Suicide Prevention Lifeline: (305)547-5762 or TTY: 510-152-2617 TTY 330-359-7374) to talk to a trained counselor call 1-800-273-TALK (toll free, 24 hour hotline) go to [facility] [address] [place] [phone]) call the [facility] Crisis Line: [phone] call 911  Patient verbalizes understanding of instructions and care plan provided today and agrees to view in MyChart. Active MyChart status and patient understanding of how to access instructions and care plan via MyChart confirmed with patient.     Telephone follow up appointment with care management team member scheduled for:  03/03/2023 at 11:00 am.  Danford Bad, BSW, MSW, LCSW  Embedded Practice Social Work Case Manager  [facility], Population Health Direct Dial: [phone]  Fax: [phone] Email: [email or web address] Website: [email or web address]

## 2023-02-18 ENCOUNTER — Ambulatory Visit: Payer: Medicare Other | Admitting: Urology

## 2023-02-18 VITALS — BP 146/67 | HR 96

## 2023-02-18 DIAGNOSIS — Z09 Encounter for follow-up examination after completed treatment for conditions other than malignant neoplasm: Secondary | ICD-10-CM

## 2023-02-18 DIAGNOSIS — Z87440 Personal history of urinary (tract) infections: Secondary | ICD-10-CM | POA: Diagnosis not present

## 2023-02-18 MED ORDER — NITROFURANTOIN MACROCRYSTAL 50 MG PO CAPS: 50.0000 mg | ORAL_CAPSULE | Freq: Every day | ORAL | 11 refills | Status: DC

## 2023-02-18 MED ORDER — SULFAMETHOXAZOLE-TRIMETHOPRIM 800-160 MG PO TABS: 1.0000 | ORAL_TABLET | Freq: Two times a day (BID) | ORAL | 3 refills | Status: DC

## 2023-02-22 ENCOUNTER — Encounter: Payer: Self-pay | Admitting: Urology

## 2023-02-22 NOTE — Patient Instructions (Signed)
Urinary Tract Infection, Adult  A urinary tract infection (UTI) is an infection of any part of the urinary tract. The urinary tract includes the kidneys, ureters, bladder, and urethra. These organs make, store, and get rid of urine in the body. An upper UTI affects the ureters and kidneys. A lower UTI affects the bladder and urethra. What are the causes? Most urinary tract infections are caused by bacteria in your genital area around your urethra, where urine leaves your body. These bacteria grow and cause inflammation of your urinary tract. What increases the risk? You are more likely to develop this condition if: You have a urinary catheter that stays in place. You are not able to control when you urinate or have a bowel movement (incontinence). You are female and you: Use a spermicide or diaphragm for birth control. Have low estrogen levels. Are pregnant. You have certain genes that increase your risk. You are sexually active. You take antibiotic medicines. You have a condition that causes your flow of urine to slow down, such as: An enlarged prostate, if you are female. Blockage in your urethra. A kidney stone. A nerve condition that affects your bladder control (neurogenic bladder). Not getting enough to drink, or not urinating often. You have certain medical conditions, such as: Diabetes. A weak disease-fighting system (immunesystem). Sickle cell disease. Gout. Spinal cord injury. What are the signs or symptoms? Symptoms of this condition include: Needing to urinate right away (urgency). Frequent urination. This may include small amounts of urine each time you urinate. Pain or burning with urination. Blood in the urine. Urine that smells bad or unusual. Trouble urinating. Cloudy urine. Vaginal discharge, if you are female. Pain in the abdomen or the lower back. You may also have: Vomiting or a decreased appetite. Confusion. Irritability or tiredness. A fever or  chills. Diarrhea. The first symptom in older adults may be confusion. In some cases, they may not have any symptoms until the infection has worsened. How is this diagnosed? This condition is diagnosed based on your medical history and a physical exam. You may also have other tests, including: Urine tests. Blood tests. Tests for STIs (sexually transmitted infections). If you have had more than one UTI, a cystoscopy or imaging studies may be done to determine the cause of the infections. How is this treated? Treatment for this condition includes: Antibiotic medicine. Over-the-counter medicines to treat discomfort. Drinking enough water to stay hydrated. If you have frequent infections or have other conditions such as a kidney stone, you may need to see a health care provider who specializes in the urinary tract (urologist). In rare cases, urinary tract infections can cause sepsis. Sepsis is a life-threatening condition that occurs when the body responds to an infection. Sepsis is treated in the hospital with IV antibiotics, fluids, and other medicines. Follow these instructions at home:  Medicines Take over-the-counter and prescription medicines only as told by your health care provider. If you were prescribed an antibiotic medicine, take it as told by your health care provider. Do not stop using the antibiotic even if you start to feel better. General instructions Make sure you: Empty your bladder often and completely. Do not hold urine for long periods of time. Empty your bladder after sex. Wipe from front to back after urinating or having a bowel movement if you are female. Use each tissue only one time when you wipe. Drink enough fluid to keep your urine pale yellow. Keep all follow-up visits. This is important. Contact a health   care provider if: Your symptoms do not get better after 1-2 days. Your symptoms go away and then return. Get help right away if: You have severe pain in  your back or your lower abdomen. You have a fever or chills. You have nausea or vomiting. Summary A urinary tract infection (UTI) is an infection of any part of the urinary tract, which includes the kidneys, ureters, bladder, and urethra. Most urinary tract infections are caused by bacteria in your genital area. Treatment for this condition often includes antibiotic medicines. If you were prescribed an antibiotic medicine, take it as told by your health care provider. Do not stop using the antibiotic even if you start to feel better. Keep all follow-up visits. This is important. This information is not intended to replace advice given to you by your health care provider. Make sure you discuss any questions you have with your health care provider. Document Revised: 12/18/2019 Document Reviewed: 12/23/2019 Elsevier Patient Education  2024 Elsevier Inc.  

## 2023-03-03 ENCOUNTER — Ambulatory Visit: Payer: Self-pay | Admitting: *Deleted

## 2023-03-03 NOTE — Patient Instructions (Signed)
Visit Information  Thank you for taking time to visit with me today. Please don't hesitate to contact me if I can be of assistance to you.   Following are the goals we discussed today:   Goals Addressed               This Visit's Progress     Provide Caregiver Services & Resources. (pt-stated)   On track     Care Coordination Interventions:  Interventions Today    Flowsheet Row Most Recent Value  Chronic Disease   Chronic disease during today's visit Diabetes, Hypertension (HTN), Other  [Caregiver Stress & Fatigue]  General Interventions   General Interventions Discussed/Reviewed General Interventions Discussed, Labs, Vaccines, Doctor Visits, Health Screening, Annual Foot Exam, Lipid Profile, General Interventions Reviewed, Annual Eye Exam, Durable Medical Equipment (DME), Community Resources, Level of Care, Communication with  [Encouraged]  Labs Hgb A1c every 3 months, Kidney Function  [Encouraged]  Vaccines COVID-19, Flu, Pneumonia, RSV, Shingles, Tetanus/Pertussis/Diphtheria  [Encouraged]  Doctor Visits Discussed/Reviewed Doctor Visits Discussed, Specialist, Doctor Visits Reviewed, Annual Wellness Visits, PCP  [Encouraged]  Health Screening Bone Density, Colonoscopy, Mammogram  [Encouraged]  Durable Medical Equipment (DME) Glucomoter, BP Cuff, Other  [Cane & Eyeglasses]  PCP/Specialist Visits Compliance with follow-up visit  [Encouraged]  Communication with PCP/Specialists, RN  [Encouraged]  Level of Care Adult Daycare, Personal Care Services, Applications, Assisted Living, Skilled Nursing Facility  [Encouraged]  Applications Medicaid, Personal Care Services, [place]-2  [Encouraged]  Exercise Interventions   Exercise Discussed/Reviewed Exercise Discussed, Assistive device use and maintanence, Exercise Reviewed, Physical Activity, Weight Managment  [Encouraged]  Physical Activity Discussed/Reviewed Physical Activity Discussed, Home Exercise Program (HEP), Physical Activity Reviewed,  Types of exercise  [Encouraged]  Weight Management Weight maintenance  [Encouraged]  Education Interventions   Education Provided Provided Therapist, sports, Provided Web-based Education, Provided Education  [Encouraged]  Provided Verbal Education On Nutrition, Mental Health/Coping with Illness, When to see the doctor, Foot Care, Eye Care, Applications, Labs, Blood Sugar Monitoring, Exercise, Medication, Development worker, community, Clinical research associate, Personal Care Services, [place]-2  [Encouraged]  Mental Health Interventions   Mental Health Discussed/Reviewed Mental Health Discussed, Anxiety, Depression, Grief and Loss, Substance Abuse, Mental Health Reviewed, Coping Strategies, Suicide, Other, Crisis  [Domestic Violence]  Nutrition Interventions   Nutrition Discussed/Reviewed Nutrition Discussed, Adding fruits and vegetables, Increasing proteins, Decreasing fats, Decreasing salt, Supplemental nutrition, Decreasing sugar intake, Portion sizes, Carbohydrate meal planning, Fluid intake, Nutrition Reviewed  [Encouraged]  Pharmacy Interventions   Pharmacy Dicussed/Reviewed Pharmacy Topics Discussed, Medications and their functions, Medication Adherence, Affording Medications, Pharmacy Topics Reviewed  [Encouraged]  Safety Interventions   Safety Discussed/Reviewed Safety Discussed, Safety Reviewed  [Encouraged]  Advanced Directive Interventions   Advanced Directives Discussed/Reviewed Advanced Directives Discussed  [Completed]       Active Listening & Reflection Utilized.  Verbalization of Feelings Encouraged.  Emotional Support Provided. Caregiver Stress, Burnout, & Fatigue Validated. Problem Solving Interventions Initiated. Task-Centered Solutions Employed.   Solution-Focused Strategies Indicated. Acceptance & Commitment Therapy Implemented. Cognitive Behavioral Therapy Performed. Encouraged Daily Implementation of Deep Breathing Exercises, Relaxation Techniques, &  Mindfulness Meditation Strategies. Encouraged Journaling, As a Means to Express Thoughts, Feelings, & Emotions. Encouraged Routine Engagement in Activities of Interest, Inside & Outside the Home. Encouraged Administration of Medications, Exactly as Prescribed. Encouraged Increased Level of Activity & Exercise, as Tolerated. Encouraged Self-Enrollment in Caregiver Support Group of Interest in [facility], from List Provided, to [employer], Cytogeneticist, Resources, Education, Sales promotion account executive, Etc., in An Effort to  Reduce & Manage Symptoms of Anxiety & Caregiver Stress. Encouraged Engagement with Representative from [facility] of Social Services [phone]), to Periodically Check Status of Application for Medicaid for Disabled Brother.  Encouraged Engagement with Representative from [facility] [phone]), to Periodically Check Status of Application for Personal Care Services for Disabled Brother. Encouraged Engagement with Danford Bad, Social Work Case Manager with [facility] [phone]), if You Have Questions, Need Assistance, or If Additional Social Work Needs Are Identified Between Now & Our Next Follow-Up Outreach Call, Scheduled on 04/02/2023 at 10:00 AM. Encouraged Attendance at Follow-Up Appointment with Phlebotomy at [facility] at [facility] (# [phone]), Scheduled on 04/14/2023 at 10:15 AM. Encouraged Attendance at Follow-Up Appointment with Rojelio Brenner, Physician Assistant with [facility] at [facility] [phone]), Scheduled on 04/21/2023 at 10:00 AM. Encouraged Attendance at Follow-Up Appointment with Primary Care Provider, Gilmore Laroche, Family Nurse Practitioner with [facility] [phone]), Scheduled on 06/15/2023 at 2:40 PM.      Our next appointment is by telephone on 04/02/2023 at 10:00 am.  Please call the care  guide team at [phone] if you need to cancel or reschedule your appointment.   If you are experiencing a Mental Health or Behavioral Health Crisis or need someone to talk to, please call the Suicide and Crisis Lifeline: 988 call the [place] National Suicide Prevention Lifeline: (762)593-4504 or TTY: (848)154-4519 TTY 423-019-2376) to talk to a trained counselor call 1-800-273-TALK (toll free, 24 hour hotline) go to [facility] [address] [place] [phone]) call the [facility] Crisis Line: [phone] call 911  Patient verbalizes understanding of instructions and care plan provided today and agrees to view in MyChart. Active MyChart status and patient understanding of how to access instructions and care plan via MyChart confirmed with patient.     Telephone follow up appointment with care management team member scheduled for:  04/02/2023 at 10:00 am.  Danford Bad, BSW, MSW, LCSW  Embedded Practice Social Work Case Manager  [facility], Population Health Direct Dial: [phone]  Fax: [phone] Email: [email or web address] Website: [email or web address]

## 2023-03-03 NOTE — Patient Outreach (Signed)
Care Coordination   Follow Up Visit Note   03/03/2023  Name: Gloria Owens MRN: [id number] DOB: 06-13-1935  Gloria Owens is a 87 y.o. year old female who sees Gloria Laroche, FNP for primary care. I spoke with [name] by phone today.  What matters to the patients health and wellness today?  Provide Engineer, technical sales & Resources.    Goals Addressed               This Visit's Progress     Provide Caregiver Services & Resources. (pt-stated)   On track     Care Coordination Interventions:  Interventions Today    Flowsheet Row Most Recent Value  Chronic Disease   Chronic disease during today's visit Diabetes, Hypertension (HTN), Other  [Caregiver Stress & Fatigue]  General Interventions   General Interventions Discussed/Reviewed General Interventions Discussed, Labs, Vaccines, Doctor Visits, Health Screening, Annual Foot Exam, Lipid Profile, General Interventions Reviewed, Annual Eye Exam, Durable Medical Equipment (DME), Community Resources, Level of Care, Communication with  [Encouraged]  Labs Hgb A1c every 3 months, Kidney Function  [Encouraged]  Vaccines COVID-19, Flu, Pneumonia, RSV, Shingles, Tetanus/Pertussis/Diphtheria  [Encouraged]  Doctor Visits Discussed/Reviewed Doctor Visits Discussed, Specialist, Doctor Visits Reviewed, Annual Wellness Visits, PCP  [Encouraged]  Health Screening Bone Density, Colonoscopy, Mammogram  [Encouraged]  Durable Medical Equipment (DME) Glucomoter, BP Cuff, Other  [Cane & Eyeglasses]  PCP/Specialist Visits Compliance with follow-up visit  [Encouraged]  Communication with PCP/Specialists, RN  [Encouraged]  Level of Care Adult Daycare, Personal Care Services, Applications, Assisted Living, Skilled Nursing Facility  [Encouraged]  Applications Medicaid, Personal Care Services, [place]-2  [Encouraged]  Exercise Interventions   Exercise Discussed/Reviewed Exercise Discussed, Assistive device use and maintanence, Exercise Reviewed, Physical  Activity, Weight Managment  [Encouraged]  Physical Activity Discussed/Reviewed Physical Activity Discussed, Home Exercise Program (HEP), Physical Activity Reviewed, Types of exercise  [Encouraged]  Weight Management Weight maintenance  [Encouraged]  Education Interventions   Education Provided Provided Therapist, sports, Provided Web-based Education, Provided Education  [Encouraged]  Provided Verbal Education On Nutrition, Mental Health/Coping with Illness, When to see the doctor, Foot Care, Eye Care, Applications, Labs, Blood Sugar Monitoring, Exercise, Medication, Development worker, community, Clinical research associate, Personal Care Services, FL-2  [Encouraged]  Mental Health Interventions   Mental Health Discussed/Reviewed Mental Health Discussed, Anxiety, Depression, Grief and Loss, Substance Abuse, Mental Health Reviewed, Coping Strategies, Suicide, Other, Crisis  [Domestic Violence]  Nutrition Interventions   Nutrition Discussed/Reviewed Nutrition Discussed, Adding fruits and vegetables, Increasing proteins, Decreasing fats, Decreasing salt, Supplemental nutrition, Decreasing sugar intake, Portion sizes, Carbohydrate meal planning, Fluid intake, Nutrition Reviewed  [Encouraged]  Pharmacy Interventions   Pharmacy Dicussed/Reviewed Pharmacy Topics Discussed, Medications and their functions, Medication Adherence, Affording Medications, Pharmacy Topics Reviewed  [Encouraged]  Safety Interventions   Safety Discussed/Reviewed Safety Discussed, Safety Reviewed  [Encouraged]  Advanced Directive Interventions   Advanced Directives Discussed/Reviewed Advanced Directives Discussed  [Completed]       Active Listening & Reflection Utilized.  Verbalization of Feelings Encouraged.  Emotional Support Provided. Caregiver Stress, Burnout, & Fatigue Validated. Problem Solving Interventions Initiated. Task-Centered Solutions Employed.   Solution-Focused Strategies  Indicated. Acceptance & Commitment Therapy Implemented. Cognitive Behavioral Therapy Performed. Encouraged Daily Implementation of Deep Breathing Exercises, Relaxation Techniques, & Mindfulness Meditation Strategies. Encouraged Journaling, As a Means to Express Thoughts, Feelings, & Emotions. Encouraged Routine Engagement in Activities of Interest, Inside & Outside the Home. Encouraged Administration of Medications, Exactly as Prescribed. Encouraged Increased Level of Activity & Exercise,  as Tolerated. Encouraged Self-Enrollment in Caregiver Support Group of Interest in [facility], from List Provided, to [employer], Cytogeneticist, Resources, Education, Sales promotion account executive, Etc., in An Effort to Reduce & Manage Symptoms of Anxiety & Caregiver Stress. Encouraged Engagement with Representative from [facility] of Social Services [phone]), to Periodically Check Status of Application for Medicaid for Disabled Brother.  Encouraged Engagement with Representative from [facility] [phone]), to Periodically Check Status of Application for Personal Care Services for Disabled Brother. Encouraged Engagement with Danford Bad, Social Work Case Manager with [facility] [phone]), if You Have Questions, Need Assistance, or If Additional Social Work Needs Are Identified Between Now & Our Next Follow-Up Outreach Call, Scheduled on 04/02/2023 at 10:00 AM. Encouraged Attendance at Follow-Up Appointment with Phlebotomy at [facility] at [facility] (# [phone]), Scheduled on 04/14/2023 at 10:15 AM. Encouraged Attendance at Follow-Up Appointment with Rojelio Brenner, Physician Assistant with [facility] at [facility] [phone]), Scheduled on 04/21/2023 at 10:00 AM. Encouraged Attendance at Follow-Up Appointment with Primary Care Provider, Gloria Owens, Family Nurse Practitioner with  [facility] [phone]), Scheduled on 06/15/2023 at 2:40 PM.      SDOH assessments and interventions completed:  Yes.  Care Coordination Interventions:  Yes, provided.   Follow up plan: Follow up call scheduled for 04/02/2023 at 10:00 am.  Encounter Outcome:  Patient Visit Completed.   Danford Bad, BSW, MSW, Printmaker Social Work Case Set designer Health  [facility], Population Health Direct Dial: [phone]  Fax: [phone] Email: [email or web address] Website: [email or web address]

## 2023-04-02 ENCOUNTER — Ambulatory Visit: Payer: Self-pay | Admitting: *Deleted

## 2023-04-02 NOTE — Patient Instructions (Signed)
Visit Information  Thank you for taking time to visit with me today. Please don't hesitate to contact me if I can be of assistance to you.   Following are the goals we discussed today:   Goals Addressed               This Visit's Progress     Provide Caregiver Services & Resources. (pt-stated)   On track     Care Coordination Interventions:  Interventions Today    Flowsheet Row Most Recent Value  Chronic Disease   Chronic disease during today's visit Diabetes, Hypertension (HTN), Other  [Caregiver Stress, Burnout & Fatigue, Sleep Disturbance, Left Lower Quadrant Abdominal Pain]  General Interventions   General Interventions Discussed/Reviewed General Interventions Discussed, Doctor Visits, Communication with, General Interventions Reviewed, Community Resources, Horticulturist, commercial (DME)  [Communication with Care Team Members]  Doctor Visits Discussed/Reviewed Doctor Visits Discussed, Doctor Visits Reviewed, Annual Wellness Visits, PCP, Specialist  [Encouraged Engagement]  Horticulturist, commercial (DME) BP Cuff, Glucomoter, Environmental consultant, Other  [Cane, Prescription Glasses, Hand-Held Shower [place], Engineer, materials in Air traffic controller, Reacher]  PCP/Specialist Visits Compliance with follow-up visit  [Encouraged]  Communication with PCP/Specialists, Charity fundraiser, Pharmacists, Social Work  [Encouraged]  Exercise Interventions   Exercise Discussed/Reviewed Exercise Discussed, Exercise Reviewed, Physical Activity, Weight Managment, Assistive device use and maintanence  [Encouraged]  Physical Activity Discussed/Reviewed Physical Activity Discussed, Physical Activity Reviewed, Types of exercise, Home Exercise Program (HEP), PREP, Gym  [Encouraged]  Weight Management Weight maintenance  [Encouraged]  Education Interventions   Education Provided Provided Education  Provided Verbal Education On Nutrition, Exercise, Medication, When to see the doctor, [facility], Mental Health/Coping with Illness  Mental Health  Interventions   Mental Health Discussed/Reviewed Mental Health Discussed, Substance Abuse, Suicide, Mental Health Reviewed, Coping Strategies, Crisis, Anxiety, Depression, Grief and Loss  [Assessed]  Nutrition Interventions   Nutrition Discussed/Reviewed Nutrition Discussed, Portion sizes, Decreasing sugar intake, Nutrition Reviewed, Increasing proteins, Carbohydrate meal planning, Decreasing fats, Adding fruits and vegetables, Decreasing salt, Fluid intake, Supplemental nutrition  [Encouraged]  Pharmacy Interventions   Pharmacy Dicussed/Reviewed Affording Medications, Pharmacy Topics Discussed, Pharmacy Topics Reviewed, Medications and their functions, Medication Adherence  [Encouraged]  Medication Adherence --  [Medication Compliant]  Safety Interventions   Safety Discussed/Reviewed Safety Discussed, Safety Reviewed, Fall Risk  [Encouraged]  Advanced Directive Interventions   Advanced Directives Discussed/Reviewed Advanced Directives Discussed  [Advanced Directives in Place]      Active Listening & Reflection Utilized.  Verbalization of Feelings Encouraged.  Emotional Support Provided. Solution-Focused Strategies Implemented. Acceptance & Commitment Therapy Performed. Cognitive Behavioral Therapy Initiated. Encouraged Self-Enrollment in Caregiver Support Group of Interest in [facility], from List Provided, to [employer], Cytogeneticist, Resources, Education, Sales promotion account executive, Etc., in An Effort to Reduce & Manage Symptoms of Anxiety & Caregiver Burnout, Stress & Fatigue. Encouraged Routine Engagement with Representative from [facility] of Social Services [phone]), to Check Status of Medicaid Application for Disabled Brother.  Encouraged Routine Engagement with Representative from [employer] [phone]), to Check Status of Personal Care Services Application for Disabled Brother. Encouraged Engagement with Danford Bad, Social Work  Case Manager with [facility] [phone]), if You Have Questions, Need Assistance, or If Additional Social Work Needs Are Identified Between Now & Our Next Follow-Up Outreach Call, Scheduled on 04/16/2023 at 11:00 AM.      Our next appointment is by telephone on 04/16/2023 at 11:00 am.  Please call the care guide team at [phone] if you need to cancel or  reschedule your appointment.   If you are experiencing a Mental Health or Behavioral Health Crisis or need someone to talk to, please call the Suicide and Crisis Lifeline: 988 call the [place] National Suicide Prevention Lifeline: (563)195-5214 or TTY: 805-631-3132 TTY 339-317-8824) to talk to a trained counselor call 1-800-273-TALK (toll free, 24 hour hotline) go to [facility] [address] [place] [phone]) call the [facility] Crisis Line: [phone] call 911  Patient verbalizes understanding of instructions and care plan provided today and agrees to view in MyChart. Active MyChart status and patient understanding of how to access instructions and care plan via MyChart confirmed with patient.     Telephone follow up appointment with care management team member scheduled for:  04/16/2023 at 11:00 am.  Danford Bad, BSW, MSW, LCSW  Embedded Practice Social Work Case Manager  [facility], Population Health Direct Dial: [phone]  Fax: [phone] Email: [email or web address] Website: [email or web address]

## 2023-04-02 NOTE — Patient Outreach (Signed)
Care Coordination   Follow Up Visit Note   04/02/2023  Name: Gloria Owens MRN: [id number] DOB: Nov 12, 1935  Gloria Owens is a 87 y.o. year old female who sees Gilmore Laroche, FNP for primary care. I spoke with [name] by phone today.  What matters to the patients health and wellness today?  Provide Engineer, technical sales & Resources.     Goals Addressed               This Visit's Progress     Provide Caregiver Services & Resources. (pt-stated)   On track     Care Coordination Interventions:  Interventions Today    Flowsheet Row Most Recent Value  Chronic Disease   Chronic disease during today's visit Diabetes, Hypertension (HTN), Other  [Caregiver Stress, Burnout & Fatigue, Sleep Disturbance, Left Lower Quadrant Abdominal Pain]  General Interventions   General Interventions Discussed/Reviewed General Interventions Discussed, Doctor Visits, Communication with, General Interventions Reviewed, Community Resources, Horticulturist, commercial (DME)  [Communication with Care Team Members]  Doctor Visits Discussed/Reviewed Doctor Visits Discussed, Doctor Visits Reviewed, Annual Wellness Visits, PCP, Specialist  [Encouraged Engagement]  Horticulturist, commercial (DME) BP Cuff, Glucomoter, Environmental consultant, Other  [Cane, Prescription Glasses, Hand-Held Shower [place], Engineer, materials in Air traffic controller, Reacher]  PCP/Specialist Visits Compliance with follow-up visit  [Encouraged]  Communication with PCP/Specialists, Charity fundraiser, Pharmacists, Social Work  [Encouraged]  Exercise Interventions   Exercise Discussed/Reviewed Exercise Discussed, Exercise Reviewed, Physical Activity, Weight Managment, Assistive device use and maintanence  [Encouraged]  Physical Activity Discussed/Reviewed Physical Activity Discussed, Physical Activity Reviewed, Types of exercise, Home Exercise Program (HEP), PREP, Gym  [Encouraged]  Weight Management Weight maintenance  [Encouraged]  Education Interventions   Education Provided Provided  Education  Provided Verbal Education On Nutrition, Exercise, Medication, When to see the doctor, [facility], Mental Health/Coping with Illness  Mental Health Interventions   Mental Health Discussed/Reviewed Mental Health Discussed, Substance Abuse, Suicide, Mental Health Reviewed, Coping Strategies, Crisis, Anxiety, Depression, Grief and Loss  [Assessed]  Nutrition Interventions   Nutrition Discussed/Reviewed Nutrition Discussed, Portion sizes, Decreasing sugar intake, Nutrition Reviewed, Increasing proteins, Carbohydrate meal planning, Decreasing fats, Adding fruits and vegetables, Decreasing salt, Fluid intake, Supplemental nutrition  [Encouraged]  Pharmacy Interventions   Pharmacy Dicussed/Reviewed Affording Medications, Pharmacy Topics Discussed, Pharmacy Topics Reviewed, Medications and their functions, Medication Adherence  [Encouraged]  Medication Adherence --  [Medication Compliant]  Safety Interventions   Safety Discussed/Reviewed Safety Discussed, Safety Reviewed, Fall Risk  [Encouraged]  Advanced Directive Interventions   Advanced Directives Discussed/Reviewed Advanced Directives Discussed  [Advanced Directives in Place]      Active Listening & Reflection Utilized.  Verbalization of Feelings Encouraged.  Emotional Support Provided. Solution-Focused Strategies Implemented. Acceptance & Commitment Therapy Performed. Cognitive Behavioral Therapy Initiated. Encouraged Self-Enrollment in Caregiver Support Group of Interest in [facility], from List Provided, to [employer], Cytogeneticist, Resources, Education, Sales promotion account executive, Etc., in An Effort to Reduce & Manage Symptoms of Anxiety & Caregiver Burnout, Stress & Fatigue. Encouraged Routine Engagement with Representative from [facility] of Social Services [phone]), to Check Status of Medicaid Application for Disabled Brother.  Encouraged Routine Engagement with Representative from  [employer] [phone]), to Check Status of Personal Care Services Application for Disabled Brother. Encouraged Engagement with Danford Bad, Social Work Case Manager with [facility] [phone]), if You Have Questions, Need Assistance, or If Additional Social Work Needs Are Identified Between Now & Our Next Follow-Up Outreach Call, Scheduled on 04/16/2023 at 11:00 AM.  SDOH assessments and interventions completed:  Yes.  Care Coordination Interventions:  Yes, provided.   Follow up plan: Follow up call scheduled for 04/16/2023 at 11:00 am.  Encounter Outcome:  Patient Visit Completed.   Danford Bad, BSW, MSW, Printmaker Social Work Case Set designer Health  [facility], Population Health Direct Dial: [phone]  Fax: [phone] Email: [email or web address] Website: [email or web address]

## 2023-04-14 ENCOUNTER — Inpatient Hospital Stay: Payer: Medicare Other | Attending: Hematology

## 2023-04-14 DIAGNOSIS — D508 Other iron deficiency anemias: Secondary | ICD-10-CM

## 2023-04-14 DIAGNOSIS — D472 Monoclonal gammopathy: Secondary | ICD-10-CM

## 2023-04-14 DIAGNOSIS — D509 Iron deficiency anemia, unspecified: Secondary | ICD-10-CM | POA: Insufficient documentation

## 2023-04-14 LAB — COMPREHENSIVE METABOLIC PANEL
ALT: 21 U/L (ref 0–44)
AST: 22 U/L (ref 15–41)
Albumin: 4.1 g/dL (ref 3.5–5.0)
Alkaline Phosphatase: 51 U/L (ref 38–126)
Anion gap: 8 (ref 5–15)
BUN: 16 mg/dL (ref 8–23)
CO2: 27 mmol/L (ref 22–32)
Calcium: 9.4 mg/dL (ref 8.9–10.3)
Chloride: 101 mmol/L (ref 98–111)
Creatinine, Ser: 0.7 mg/dL (ref 0.44–1.00)
GFR, Estimated: >60 mL/min (ref >60)
Glucose, Bld: 113 mg/dL — ABNORMAL HIGH (ref 70–99)
Potassium: 4.1 mmol/L (ref 3.5–5.1)
Sodium: 136 mmol/L (ref 135–145)
Total Bilirubin: 0.6 mg/dL (ref <1.2)
Total Protein: 7.2 g/dL (ref 6.5–8.1)

## 2023-04-14 LAB — CBC WITH DIFFERENTIAL/PLATELET
Abs Immature Granulocytes: 0.01 10*3/uL (ref 0.00–0.07)
Basophils Absolute: 0 10*3/uL (ref 0.0–0.1)
Basophils Relative: 0 %
Eosinophils Absolute: 0.1 10*3/uL (ref 0.0–0.5)
Eosinophils Relative: 2 %
HCT: 42 % (ref 36.0–46.0)
Hemoglobin: 14.3 g/dL (ref 12.0–15.0)
Immature Granulocytes: 0 %
Lymphocytes Relative: 20 %
Lymphs Abs: 1.2 10*3/uL (ref 0.7–4.0)
MCH: 34.1 pg — ABNORMAL HIGH (ref 26.0–34.0)
MCHC: 34 g/dL (ref 30.0–36.0)
MCV: 100.2 fL — ABNORMAL HIGH (ref 80.0–100.0)
Monocytes Absolute: 0.5 10*3/uL (ref 0.1–1.0)
Monocytes Relative: 8 %
Neutro Abs: 4.2 10*3/uL (ref 1.7–7.7)
Neutrophils Relative %: 70 %
Platelets: 219 10*3/uL (ref 150–400)
RBC: 4.19 MIL/uL (ref 3.87–5.11)
RDW: 12.6 % (ref 11.5–15.5)
WBC: 6 10*3/uL (ref 4.0–10.5)
nRBC: 0 % (ref 0.0–0.2)

## 2023-04-14 LAB — IRON AND TIBC
Iron: 90 ug/dL (ref 28–170)
Saturation Ratios: 26 % (ref 10.4–31.8)
TIBC: 346 ug/dL (ref 250–450)
UIBC: 256 ug/dL

## 2023-04-14 LAB — FERRITIN: Ferritin: 82 ng/mL (ref 11–307)

## 2023-04-14 LAB — LACTATE DEHYDROGENASE: LDH: 109 U/L (ref 98–192)

## 2023-04-15 LAB — KAPPA/LAMBDA LIGHT CHAINS
Kappa free light chain: 309.2 mg/L — ABNORMAL HIGH (ref 3.3–19.4)
Kappa, lambda light chain ratio: 26.43 — ABNORMAL HIGH (ref 0.26–1.65)
Lambda free light chains: 11.7 mg/L (ref 5.7–26.3)

## 2023-04-16 ENCOUNTER — Encounter: Payer: Self-pay | Admitting: *Deleted

## 2023-04-16 ENCOUNTER — Ambulatory Visit: Payer: Self-pay | Admitting: *Deleted

## 2023-04-16 NOTE — Patient Outreach (Signed)
Care Coordination   Follow Up Visit Note   04/16/2023  Name: Gloria Owens MRN: [id number] DOB: 18-Apr-1936  Gloria Owens is a 87 y.o. year old female who sees Gilmore Laroche, FNP for primary care. I spoke with Gloria Owens by phone today.  What matters to the patients health and wellness today?  Provide Engineer, technical sales & Resources.    Goals Addressed               This Visit's Progress     Provide Caregiver Services & Resources. (pt-stated)   On track     Care Coordination Interventions:  Interventions Today    Flowsheet Row Most Recent Value  Chronic Disease   Chronic disease during today's visit Diabetes, Hypertension (HTN), Other  [Caregiver Stress, Burnout & Fatigue, Sleep Disturbance, Left Lower Quadrant Abdominal Pain]  General Interventions   General Interventions Discussed/Reviewed General Interventions Discussed, Doctor Visits, Communication with, General Interventions Reviewed, Community Resources, Horticulturist, commercial (DME)  [Communication with Care Team Members]  Doctor Visits Discussed/Reviewed Doctor Visits Discussed, Doctor Visits Reviewed, Annual Wellness Visits, PCP, Specialist  [Encouraged Engagement]  Horticulturist, commercial (DME) BP Cuff, Glucomoter, Environmental consultant, Other  [Cane, Prescription Glasses, Hand-Held Shower [place], Engineer, materials in Air traffic controller, Reacher]  PCP/Specialist Visits Compliance with follow-up visit  [Encouraged]  Communication with PCP/Specialists, Charity fundraiser, Pharmacists, Social Work  [Encouraged]  Exercise Interventions   Exercise Discussed/Reviewed Exercise Discussed, Exercise Reviewed, Physical Activity, Weight Managment, Assistive device use and maintanence  [Encouraged]  Physical Activity Discussed/Reviewed Physical Activity Discussed, Physical Activity Reviewed, Types of exercise, Home Exercise Program (HEP), PREP, Gym  [Encouraged]  Weight Management Weight maintenance  [Encouraged]  Education Interventions   Education Provided Provided  Education  Provided Verbal Education On Nutrition, Exercise, Medication, When to see the doctor, [facility], Mental Health/Coping with Illness  Mental Health Interventions   Mental Health Discussed/Reviewed Mental Health Discussed, Substance Abuse, Suicide, Mental Health Reviewed, Coping Strategies, Crisis, Anxiety, Depression, Grief and Loss  [Assessed]  Nutrition Interventions   Nutrition Discussed/Reviewed Nutrition Discussed, Portion sizes, Decreasing sugar intake, Nutrition Reviewed, Increasing proteins, Carbohydrate meal planning, Decreasing fats, Adding fruits and vegetables, Decreasing salt, Fluid intake, Supplemental nutrition  [Encouraged]  Pharmacy Interventions   Pharmacy Dicussed/Reviewed Affording Medications, Pharmacy Topics Discussed, Pharmacy Topics Reviewed, Medications and their functions, Medication Adherence  [Encouraged]  Medication Adherence --  [Medication Compliant]  Safety Interventions   Safety Discussed/Reviewed Safety Discussed, Safety Reviewed, Fall Risk  [Encouraged]  Advanced Directive Interventions   Advanced Directives Discussed/Reviewed Advanced Directives Discussed  [Advanced Directives in Place]      Active Listening & Reflection Utilized.  Verbalization of Feelings Encouraged.  Emotional Support Provided. Solution-Focused Strategies Implemented. Acceptance & Commitment Therapy Performed. Cognitive Behavioral Therapy Initiated. Confirmed Increased Level of Activity. Offered Counseling & Supportive Services While Discussing Anxiety Level Regarding Upcoming Oncology Staging Appointment, Scheduled on 04/21/2023 at 10:00 AM, with Rojelio Brenner, Certified Physician Assistant with [facility] [phone]). Encouraged Daily Implementation of Deep Breathing Exercises, Relaxation Techniques & Mindfulness Meditation Strategies. Encouraged Routine Engagement in Activities of Interest, Inside & Outside the  Home. Encouraged Routine Engagement with Danford Bad, Licensed Clinical Social Worker with [facility] [phone]), if You Have Questions, Need Assistance, or If Additional Social Work Needs Are Identified Between Now & Our Next Follow-Up Outreach Call, Scheduled on 05/15/2023 at 9:00 AM.        SDOH assessments and interventions completed:  Yes.  SDOH Interventions Today  Flowsheet Row Most Recent Value  SDOH Interventions   Food Insecurity Interventions Intervention Not Indicated  Housing Interventions Intervention Not Indicated  Transportation Interventions Intervention Not Indicated, Patient Resources (Friends/Family)  Utilities Interventions Intervention Not Indicated  Alcohol Usage Interventions Intervention Not Indicated (Score <7)  Financial Strain Interventions Intervention Not Indicated  Physical Activity Interventions Intervention Not Indicated  Stress Interventions Intervention Not Indicated  Social Connections Interventions Intervention Not Indicated  Health Literacy Interventions Intervention Not Indicated     Care Coordination Interventions:  Yes, provided.   Follow up plan: Follow up call scheduled for 05/15/2023 at 9:00 am.  Encounter Outcome:  Patient Visit Completed.   Danford Bad, BSW, MSW, Printmaker Social Work Case Set designer Health  [facility], Population Health Direct Dial: [phone]  Fax: [phone] Email: [email or web address]

## 2023-04-16 NOTE — Patient Instructions (Signed)
Visit Information  Thank you for taking time to visit with me today. Please don't hesitate to contact me if I can be of assistance to you.   Following are the goals we discussed today:   Goals Addressed               This Visit's Progress     Provide Caregiver Services & Resources. (pt-stated)   On track     Care Coordination Interventions:  Interventions Today    Flowsheet Row Most Recent Value  Chronic Disease   Chronic disease during today's visit Diabetes, Hypertension (HTN), Other  [Caregiver Stress, Burnout & Fatigue, Sleep Disturbance, Left Lower Quadrant Abdominal Pain]  General Interventions   General Interventions Discussed/Reviewed General Interventions Discussed, Doctor Visits, Communication with, General Interventions Reviewed, Community Resources, Horticulturist, commercial (DME)  [Communication with Care Team Members]  Doctor Visits Discussed/Reviewed Doctor Visits Discussed, Doctor Visits Reviewed, Annual Wellness Visits, PCP, Specialist  [Encouraged Engagement]  Horticulturist, commercial (DME) BP Cuff, Glucomoter, Environmental consultant, Other  [Cane, Prescription Glasses, Hand-Held Shower [place], Engineer, materials in Air traffic controller, Reacher]  PCP/Specialist Visits Compliance with follow-up visit  [Encouraged]  Communication with PCP/Specialists, Charity fundraiser, Pharmacists, Social Work  [Encouraged]  Exercise Interventions   Exercise Discussed/Reviewed Exercise Discussed, Exercise Reviewed, Physical Activity, Weight Managment, Assistive device use and maintanence  [Encouraged]  Physical Activity Discussed/Reviewed Physical Activity Discussed, Physical Activity Reviewed, Types of exercise, Home Exercise Program (HEP), PREP, Gym  [Encouraged]  Weight Management Weight maintenance  [Encouraged]  Education Interventions   Education Provided Provided Education  Provided Verbal Education On Nutrition, Exercise, Medication, When to see the doctor, [facility], Mental Health/Coping with Illness  Mental Health  Interventions   Mental Health Discussed/Reviewed Mental Health Discussed, Substance Abuse, Suicide, Mental Health Reviewed, Coping Strategies, Crisis, Anxiety, Depression, Grief and Loss  [Assessed]  Nutrition Interventions   Nutrition Discussed/Reviewed Nutrition Discussed, Portion sizes, Decreasing sugar intake, Nutrition Reviewed, Increasing proteins, Carbohydrate meal planning, Decreasing fats, Adding fruits and vegetables, Decreasing salt, Fluid intake, Supplemental nutrition  [Encouraged]  Pharmacy Interventions   Pharmacy Dicussed/Reviewed Affording Medications, Pharmacy Topics Discussed, Pharmacy Topics Reviewed, Medications and their functions, Medication Adherence  [Encouraged]  Medication Adherence --  [Medication Compliant]  Safety Interventions   Safety Discussed/Reviewed Safety Discussed, Safety Reviewed, Fall Risk  [Encouraged]  Advanced Directive Interventions   Advanced Directives Discussed/Reviewed Advanced Directives Discussed  [Advanced Directives in Place]      Active Listening & Reflection Utilized.  Verbalization of Feelings Encouraged.  Emotional Support Provided. Solution-Focused Strategies Implemented. Acceptance & Commitment Therapy Performed. Cognitive Behavioral Therapy Initiated. Confirmed Increased Level of Activity. Offered Counseling & Supportive Services While Discussing Anxiety Level Regarding Upcoming Oncology Staging Appointment, Scheduled on 04/21/2023 at 10:00 AM, with Rojelio Brenner, Certified Physician Assistant with [facility] [phone]). Encouraged Daily Implementation of Deep Breathing Exercises, Relaxation Techniques & Mindfulness Meditation Strategies. Encouraged Routine Engagement in Activities of Interest, Inside & Outside the Home. Encouraged Routine Engagement with Danford Bad, Licensed Clinical Social Worker with [facility] [phone]), if You Have Questions, Need  Assistance, or If Additional Social Work Needs Are Identified Between Now & Our Next Follow-Up Outreach Call, Scheduled on 05/15/2023 at 9:00 AM.      Our next appointment is by telephone on 05/15/2023 at 9:00 am.  Please call the care guide team at [phone] if you need to cancel or reschedule your appointment.   If you are experiencing a Mental Health or Behavioral Health Crisis or  need someone to talk to, please call the Suicide and Crisis Lifeline: 988 call the [place] National Suicide Prevention Lifeline: 6153577915 or TTY: 6466030621 TTY 920-086-7333) to talk to a trained counselor call 1-800-273-TALK (toll free, 24 hour hotline) go to [facility] [address] [place] [phone]) call the [facility] Crisis Line: [phone] call 911  Patient verbalizes understanding of instructions and care plan provided today and agrees to view in MyChart. Active MyChart status and patient understanding of how to access instructions and care plan via MyChart confirmed with patient.     Telephone follow up appointment with care management team member scheduled for:  05/15/2023 at 9:00 am.  Danford Bad, BSW, MSW, LCSW  Embedded Practice Social Work Case Manager  [facility], Population Health Direct Dial: [phone]  Fax: [phone] Email: [email or web address]: [email or web address]

## 2023-04-19 LAB — IMMUNOFIXATION ELECTROPHORESIS
IgA: 150 mg/dL (ref 64–422)
IgG (Immunoglobin G), Serum: 927 mg/dL (ref 586–1602)
IgM (Immunoglobulin M), Srm: 63 mg/dL (ref 26–217)
Total Protein ELP: 6.8 g/dL (ref 6.0–8.5)

## 2023-04-20 NOTE — Progress Notes (Unsigned)
VIRTUAL VISIT via TELEPHONE NOTE [facility]   I connected with Gloria Owens  on 04/21/23 at  10:30 AM by telephone and verified that I am speaking with the correct person using two identifiers.  Location: Patient: Home Provider: [facility]   I discussed the limitations, risks, security and privacy concerns of performing an evaluation and management service by telephone and the availability of in person appointments. I also discussed with the patient that there may be a patient responsible charge related to this service. The patient expressed understanding and agreed to proceed.  REASON FOR VISIT:  Follow-up for iron deficiency anemia and MGUS   CURRENT THERAPY: Intermittent IV iron + daily iron tablet  INTERVAL HISTORY:  Gloria Owens is contacted today for follow-up of iron deficiency anemia and MGUS.  She was seen by Rojelio Brenner PA-C on 01/14/2023.  At today's visit, she reports feeling fairly well.  She continues to have improved energy after her IV iron from June 2024.  She reports stool that is dark from iron supplementation but denies any bright red blood per rectum or melena.  Bowel movements have improved after taking Colace along with iron supplement.  No recurrent ice pica.  She has occasional headaches at baseline.   She denies any chest pain, dyspnea on exertion, lightheadedness, or syncope.   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  She denies any B symptoms.c She does have ongoing left lower quadrant abdominal pain and frequent UTIs, which is being worked up by her PCP.  She has been previously found to have sigmoid colon stenosis and experiences some chronic constipation related to this.   She has 100% energy and 25% appetite. She has lost about 10 pounds in the past year due to poor appetite and decreased oral intake  after her husband passed away.  She is going to start drinking Glucerna to supplement her caloric intake.  REVIEW OF SYSTEMS:   Review of Systems  Constitutional:  Negative for chills, diaphoresis, fever, malaise/fatigue and weight loss.  Respiratory:  Negative for cough and shortness of breath.   Cardiovascular:  Negative for chest pain and palpitations.  Gastrointestinal:  Positive for abdominal pain (LLQ). Negative for blood in stool, melena, nausea and vomiting.  Neurological:  Negative for dizziness and headaches.     PHYSICAL EXAM: (per limitations of virtual telephone visit)  The patient is alert and oriented x 3, exhibiting adequate mentation, good mood, and ability to speak in full sentences and execute sound judgement.  ASSESSMENT & PLAN:  1.  Severe iron deficiency anemia: - Labs from 10/27/2022 showed hemoglobin 9, ferritin 9, iron saturation 4% - History of PRBC transfusions required at the time of back surgeries - Additional hematology workup (12/18/2022) showed normal B12, folate, MMA, copper. - Taking iron tablet daily since June 2024  - Feraheme x 2 on 11/06/2022 and 11/14/2022 - No prior EGD.  Patient denies any recent colonoscopies - reports exploratory laparotomy in 1967, found to have extensive endometriosis.  Exploratory laparotomy in 1970 with total abdominal hysterectomy and sigmoid resection with reanastomosis.  Details of procedure are not available.  Patient reports that she previously had attempts at colonoscopy, which were unsuccessful due to stenosis, reports that "sigmoid colon is no larger than an English pea and therefore evaluated by barium enemas only" - Barium enema of colon (03/28/2022): Diverticulosis,  no evidence of malignancy. - CT abdomen/pelvis (12/12/2022): No evidence of stomach or bowel malignancy, but she does have extensive colonic diverticulosis.  No pathologically enlarged lymph nodes.  Additional findings including bilateral adnexal cysts are being  worked up by her PCP. - Reports dark stool after starting iron tablets, but denies any gross rectal bleeding or melena - Most recent labs (04/14/2023): Hgb 14.3, ferritin 82, iron saturation 26% - PLAN: No indication for IV iron at this time.  - Continue daily iron supplement. - Recheck in 6 months and consider additional testing (i.e. stool cards and referral to GI for EGD/barium enema of colon) if recurrent anemia.   2.  IgA light chain MGUS - Workup of anemia revealed significantly elevated kappa free light chain 265.2, with normal lambda free light chain 9.7, elevated FLC ratio 27.34.  SPEP was negative. - Additional labs (12/31/2022): Immunofixation = faint band in IgA, light chain specificity unable to be verified. Creatinine 0.68, calcium 9.5, Hgb 12.9. Beta-2 microglobulin 1.9, LDH 118. - 24-hour urine/UPEP (01/06/2023): Urine immunofixation negative.  Urine M spike not observed. - Skeletal survey (12/31/2022): No lytic or sclerotic lesions identified. - Most recent MGUS/myeloma panel (04/14/2023): Immunofixation again shows faint band in IgA, with light chain specificity unable to be verified. SPEP pending Elevated kappa free light chain 309.2, normal lambda 11.7, elevated FLC ratio 26.43 Normal LDH.  Creatinine 0.70, calcium 9.4, Hgb 14.3 - We have discussed diagnosis of MGUS and need for ongoing monitoring due to risk of progression to multiple myeloma. - PLAN: Repeat MGUS/myeloma panel in 6 months - Annual skeletal survey and 24-hour urine due around August 2025    3.  Social/family history: - Lives by herself at home and also takes care of her brother.  She is able to do all her ADLs and also mows her [place].5 acres land.  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  Non-smoker.  No chemical exposure. - 4 brothers had lung cancer.  1 of those brothers also had leukemia.  1 sister had pancreatic cancer.  Niece and nephew had cancer.   PLAN SUMMARY: >> Labs in 6 months = CBC/D, CMP, SPEP, light  chains, immunofixation, LDH, ferritin, iron/TIBC >> OFFICE visit in 6 months (1 week after labs)     I discussed the assessment and treatment plan with the patient. The patient was provided an opportunity to ask questions and all were answered. The patient agreed with the plan and demonstrated an understanding of the instructions.   The patient was advised to call back or seek an in-person evaluation if the symptoms worsen or if the condition fails to improve as anticipated.  I provided 22 minutes of non-face-to-face time during this encounter.  Carnella Guadalajara, PA-C 04/21/23 10:53 AM

## 2023-04-21 ENCOUNTER — Inpatient Hospital Stay: Payer: Medicare Other | Admitting: Physician Assistant

## 2023-04-21 DIAGNOSIS — R7689 Other specified abnormal immunological findings in serum: Secondary | ICD-10-CM

## 2023-04-21 DIAGNOSIS — D508 Other iron deficiency anemias: Secondary | ICD-10-CM | POA: Diagnosis not present

## 2023-04-21 DIAGNOSIS — D472 Monoclonal gammopathy: Secondary | ICD-10-CM | POA: Diagnosis not present

## 2023-04-21 DIAGNOSIS — R768 Other specified abnormal immunological findings in serum: Secondary | ICD-10-CM

## 2023-04-22 LAB — PROTEIN ELECTROPHORESIS, SERUM
A/G Ratio: 1.6 (ref 0.7–1.7)
Albumin ELP: 4 g/dL (ref 2.9–4.4)
Alpha-1-Globulin: 0.2 g/dL (ref 0.0–0.4)
Alpha-2-Globulin: 0.7 g/dL (ref 0.4–1.0)
Beta Globulin: 1 g/dL (ref 0.7–1.3)
Gamma Globulin: 0.6 g/dL (ref 0.4–1.8)
Globulin, Total: 2.5 g/dL (ref 2.2–3.9)
Total Protein ELP: 6.5 g/dL (ref 6.0–8.5)

## 2023-05-15 ENCOUNTER — Ambulatory Visit: Payer: Self-pay | Admitting: *Deleted

## 2023-05-15 NOTE — Patient Outreach (Signed)
Care Coordination   Follow Up Visit Note   05/15/2023  Name: Gloria Owens MRN: [id number] DOB: 1935/10/19  Gloria Owens is a 87 y.o. year old female who sees Gilmore Laroche, FNP for primary care. I spoke with [name] by phone today.  What matters to the patients health and wellness today?  Provide Engineer, technical sales & Resources.   Goals Addressed               This Visit's Progress     Provide Caregiver Services & Resources. (pt-stated)   On track     Care Coordination Interventions:   Interventions Today    Flowsheet Row Most Recent Value  Chronic Disease   Chronic disease during today's visit Diabetes, Hypertension (HTN), Other  [Monoclonal Gammopathy of Undetermined Significance, Caregiver Stress, Burnout & Fatigue, Sleep Disturbance, Left Lower Quadrant Abdominal Pain, Diminished Appetite, Unintentional Weight Loss.]  General Interventions   General Interventions Discussed/Reviewed General Interventions Discussed, Lipid Profile, Doctor Visits, Durable Medical Equipment (DME), Communication with, General Interventions Reviewed, Annual Eye Exam, Labs, Annual Foot Exam, [employer], Health Screening, Vaccines, Level of Care  [Encouraged Routine Engagement with Care Team Members.]  Labs Hgb A1c every 3 months, Kidney Function  [Encouraged Routine Labwork.]  Vaccines COVID-19, Flu, Pneumonia, RSV, Shingles, Tetanus/Pertussis/Diphtheria  [Encouraged Annual Vaccinations.]  Doctor Visits Discussed/Reviewed Doctor Visits Discussed, Doctor Visits Reviewed, Annual Wellness Visits, PCP, Specialist  [Encouraged Routine Engagement with Care Team Members.]  Health Screening Bone Density, Colonoscopy, Mammogram  [Encouraged Annual Health Screenings.]  Durable Medical Equipment (DME) BP Cuff, Glucomoter, Other  [Eyeglasses, Hand-Held Shower Hose, Engineer, materials in [place], Cane.]  PCP/Specialist Visits Compliance with follow-up visit  [Encouraged Routine Engagement with Care Team  Members.]  Communication with PCP/Specialists, Charity fundraiser, Pharmacists, Social Work  [employer] Routine Engagement with Care Team Members.]  Level of Care Adult Daycare, Air traffic controller, Assisted Living, Skilled Nursing Facility  [Confirmed Disinterest in Enrollment in Adult Day Care Program or Receiving Assistance Pursuing Higher Level of Care Placement Options (I.e Assisted Living Versus Skilled Nursing Facility).]  Applications Medicaid, Personal Care Services  [Confirmed Ineligibility for Medicaid & Personal Care Services.]  Exercise Interventions   Exercise Discussed/Reviewed Exercise Discussed, Exercise Reviewed, Physical Activity, Weight Managment, Assistive device use and maintanence  [Encouraged Routine Engagement in Activities of Interest.]  Physical Activity Discussed/Reviewed Physical Activity Discussed, Physical Activity Reviewed, Types of exercise, Home Exercise Program (HEP), PREP, Gym  [Encouraged Increased Level of Activity & Exercise, Inside & Outside the Home.]  Weight Management Weight maintenance  [Encouraged.]  Education Interventions   Education Provided Provided Education  [Educational Material Provided & Reviewed.]  Provided Verbal Education On Nutrition, Foot Care, Eye Care, Labs, Blood Sugar Monitoring, Mental Health/Coping with Illness, Applications, Exercise, Medication, When to see the doctor, [employer], Building services engineer, Entertained Questions to Ensure Understanding & Encouraged Implementation.]  Labs Reviewed Hgb A1c  [Encouraged Routine Monitoring.]  Applications Medicaid, Personal Care Services  [Confirmed Ineligibility for Medicaid & Personal Care Services.]  Mental Health Interventions   Mental Health Discussed/Reviewed Mental Health Discussed, Substance Abuse, Suicide, Mental Health Reviewed, Other, Coping Strategies, Crisis, Anxiety, Depression, Grief and Loss  [Assessed Mental Health & Cognitive Status.]  Nutrition  Interventions   Nutrition Discussed/Reviewed Nutrition Discussed, Portion sizes, Decreasing sugar intake, Nutrition Reviewed, Increasing proteins, Carbohydrate meal planning, Decreasing fats, Adding fruits and vegetables, Decreasing salt, Fluid intake, Supplemental nutrition  [Encouraged Heart-Healthy, Diabetic-Friendly, Low-Fat, Protein-Rich Diet.]  Pharmacy Interventions   Pharmacy Dicussed/Reviewed Pharmacy Topics  Discussed, Affording Medications, Pharmacy Topics Reviewed, Medications and their functions, Medication Adherence  [Confirmed Prescription Medication Compliance & Ability to Afford Prescription Medications.]  Medication Adherence --  [Administering Medications as Prescribed.]  Safety Interventions   Safety Discussed/Reviewed Safety Discussed, Safety Reviewed, Fall Risk, Home Safety  [Encouraged Routine Use of Home Safety Devices & Durable Medical Equipment.]  Home Safety Assistive Devices, Need for home safety assessment  [Encouraged Consideration of Home Safety Evaluation.]  Advanced Directive Interventions   Advanced Directives Discussed/Reviewed Advanced Directives Discussed, Advanced Directives Reviewed  Retail banker (Living Will & Healthcare Power of Attorney Documents) Initated & Copies Requested to Scan into Electronic Medical Record in Epic.]      Active Listening & Reflection Utilized.  Verbalization of Feelings Encouraged.  Emotional Support Provided. Cognitive Behavioral Therapy Indicated. Acceptance & Commitment Therapy Implemented. Client-Centered Therapy Initiated. Encouraged Routine Engagement with Danford Bad, Licensed Clinical Social Worker with [facility] [phone]), if You Have Questions, Need Assistance, or If Additional Social Work Needs Are Identified Between Now & Our Next Follow-Up Outreach Call, Scheduled on 06/12/2023 at 11:30 AM.      SDOH assessments and interventions completed:  Yes.  Care Coordination Interventions:   Yes, provided.   Follow up plan: Follow up call scheduled for 06/12/2023 at 11:30 am.  Encounter Outcome:  Patient Visit Completed.   Danford Bad, BSW, MSW, Printmaker Social Work Case Set designer Health  [facility], Population Health Direct Dial: [phone]  Fax: [phone] Email: [email or web address]: [email or web address]

## 2023-05-15 NOTE — Patient Instructions (Signed)
Visit Information  Thank you for taking time to visit with me today. Please don't hesitate to contact me if I can be of assistance to you.   Following are the goals we discussed today:   Goals Addressed               This Visit's Progress     Provide Caregiver Services & Resources. (pt-stated)   On track     Care Coordination Interventions:   Interventions Today    Flowsheet Row Most Recent Value  Chronic Disease   Chronic disease during today's visit Diabetes, Hypertension (HTN), Other  [Monoclonal Gammopathy of Undetermined Significance, Caregiver Stress, Burnout & Fatigue, Sleep Disturbance, Left Lower Quadrant Abdominal Pain, Diminished Appetite, Unintentional Weight Loss.]  General Interventions   General Interventions Discussed/Reviewed General Interventions Discussed, Lipid Profile, Doctor Visits, Durable Medical Equipment (DME), Communication with, General Interventions Reviewed, Annual Eye Exam, Labs, Annual Foot Exam, [employer], Health Screening, Vaccines, Level of Care  [Encouraged Routine Engagement with Care Team Members.]  Labs Hgb A1c every 3 months, Kidney Function  [Encouraged Routine Labwork.]  Vaccines COVID-19, Flu, Pneumonia, RSV, Shingles, Tetanus/Pertussis/Diphtheria  [Encouraged Annual Vaccinations.]  Doctor Visits Discussed/Reviewed Doctor Visits Discussed, Doctor Visits Reviewed, Annual Wellness Visits, PCP, Specialist  [Encouraged Routine Engagement with Care Team Members.]  Health Screening Bone Density, Colonoscopy, Mammogram  [Encouraged Annual Health Screenings.]  Durable Medical Equipment (DME) BP Cuff, Glucomoter, Other  [Eyeglasses, Hand-Held Shower Hose, Engineer, materials in [place], Cane.]  PCP/Specialist Visits Compliance with follow-up visit  [Encouraged Routine Engagement with Care Team Members.]  Communication with PCP/Specialists, Charity fundraiser, Pharmacists, Social Work  [employer] Routine Engagement with Care Team Members.]  Level of Care Adult Daycare,  Air traffic controller, Assisted Living, Skilled Nursing Facility  [Confirmed Disinterest in Enrollment in Adult Day Care Program or Receiving Assistance Pursuing Higher Level of Care Placement Options (I.e Assisted Living Versus Skilled Nursing Facility).]  Applications Medicaid, Personal Care Services  [Confirmed Ineligibility for Medicaid & Personal Care Services.]  Exercise Interventions   Exercise Discussed/Reviewed Exercise Discussed, Exercise Reviewed, Physical Activity, Weight Managment, Assistive device use and maintanence  [Encouraged Routine Engagement in Activities of Interest.]  Physical Activity Discussed/Reviewed Physical Activity Discussed, Physical Activity Reviewed, Types of exercise, Home Exercise Program (HEP), PREP, Gym  [Encouraged Increased Level of Activity & Exercise, Inside & Outside the Home.]  Weight Management Weight maintenance  [Encouraged.]  Education Interventions   Education Provided Provided Education  [Educational Material Provided & Reviewed.]  Provided Verbal Education On Nutrition, Foot Care, Eye Care, Labs, Blood Sugar Monitoring, Mental Health/Coping with Illness, Applications, Exercise, Medication, When to see the doctor, [employer], Building services engineer, Entertained Questions to Ensure Understanding & Encouraged Implementation.]  Labs Reviewed Hgb A1c  [Encouraged Routine Monitoring.]  Applications Medicaid, Personal Care Services  [Confirmed Ineligibility for Medicaid & Personal Care Services.]  Mental Health Interventions   Mental Health Discussed/Reviewed Mental Health Discussed, Substance Abuse, Suicide, Mental Health Reviewed, Other, Coping Strategies, Crisis, Anxiety, Depression, Grief and Loss  [Assessed Mental Health & Cognitive Status.]  Nutrition Interventions   Nutrition Discussed/Reviewed Nutrition Discussed, Portion sizes, Decreasing sugar intake, Nutrition Reviewed, Increasing proteins, Carbohydrate meal  planning, Decreasing fats, Adding fruits and vegetables, Decreasing salt, Fluid intake, Supplemental nutrition  [Encouraged Heart-Healthy, Diabetic-Friendly, Low-Fat, Protein-Rich Diet.]  Pharmacy Interventions   Pharmacy Dicussed/Reviewed Pharmacy Topics Discussed, Affording Medications, Pharmacy Topics Reviewed, Medications and their functions, Medication Adherence  [Confirmed Prescription Medication Compliance & Ability to Afford Prescription Medications.]  Medication Adherence --  [  Administering Medications as Prescribed.]  Safety Interventions   Safety Discussed/Reviewed Safety Discussed, Safety Reviewed, Fall Risk, Home Safety  [Encouraged Routine Use of Home Safety Devices & Durable Medical Equipment.]  Home Safety Assistive Devices, Need for home safety assessment  [Encouraged Consideration of Home Safety Evaluation.]  Advanced Directive Interventions   Advanced Directives Discussed/Reviewed Advanced Directives Discussed, Advanced Directives Reviewed  Retail banker (Living Will & Healthcare Power of Attorney Documents) Initated & Copies Requested to Scan into Electronic Medical Record in Epic.]      Active Listening & Reflection Utilized.  Verbalization of Feelings Encouraged.  Emotional Support Provided. Cognitive Behavioral Therapy Indicated. Acceptance & Commitment Therapy Implemented. Client-Centered Therapy Initiated. Encouraged Routine Engagement with Danford Bad, Licensed Clinical Social Worker with [facility] [phone]), if You Have Questions, Need Assistance, or If Additional Social Work Needs Are Identified Between Now & Our Next Follow-Up Outreach Call, Scheduled on 06/12/2023 at 11:30 AM.      Our next appointment is by telephone on 06/12/2023 at 11:30 am.  Please call the care guide team at [phone] if you need to cancel or reschedule your appointment.   If you are experiencing a Mental Health or Behavioral Health Crisis or need  someone to talk to, please call the Suicide and Crisis Lifeline: 988 call the [place] National Suicide Prevention Lifeline: (205)225-2396 or TTY: 856 364 0760 TTY 5628328894) to talk to a trained counselor call 1-800-273-TALK (toll free, 24 hour hotline) go to [facility] [address], [place] [phone]) call the [facility] Crisis Line: [phone] call 911  Patient verbalizes understanding of instructions and care plan provided today and agrees to view in MyChart. Active MyChart status and patient understanding of how to access instructions and care plan via MyChart confirmed with patient.     Telephone follow up appointment with care management team member scheduled for:  06/12/2023 at 11:30 am.  Danford Bad, BSW, MSW, LCSW  Embedded Practice Social Work Case Manager  [facility], Population Health Direct Dial: [phone]  Fax: [phone] Email: [email or web address] Website: [email or web address]

## 2023-06-11 ENCOUNTER — Ambulatory Visit: Payer: Self-pay | Admitting: *Deleted

## 2023-06-11 NOTE — Patient Instructions (Signed)
Visit Information  Thank you for taking time to visit with me today. Please don't hesitate to contact me if I can be of assistance to you.   Following are the goals we discussed today:   Goals Addressed               This Visit's Progress     Provide Caregiver Services & Resources. (pt-stated)   On track     Care Coordination Interventions:   Interventions Today    Flowsheet Row Most Recent Value  Chronic Disease   Chronic disease during today's visit Diabetes, Hypertension (HTN), Other  [Monoclonal Gammopathy of Undetermined Significance, Caregiver Stress, Burnout & Fatigue, Sleep Disturbance, Left Lower Quadrant Abdominal Pain, Diminished Appetite, Unintentional Weight Loss.]  General Interventions   General Interventions Discussed/Reviewed General Interventions Discussed, Lipid Profile, Doctor Visits, Durable Medical Equipment (DME), Communication with, General Interventions Reviewed, Annual Eye Exam, Labs, Annual Foot Exam, [employer], Health Screening, Vaccines, Level of Care  [Encouraged Routine Engagement with Care Team Members.]  Labs Hgb A1c every 3 months, Kidney Function  [Encouraged Routine Labwork.]  Vaccines COVID-19, Flu, Pneumonia, RSV, Shingles, Tetanus/Pertussis/Diphtheria  [Encouraged Annual Vaccinations.]  Doctor Visits Discussed/Reviewed Doctor Visits Discussed, Doctor Visits Reviewed, Annual Wellness Visits, PCP, Specialist  [Encouraged Routine Engagement with Care Team Members.]  Health Screening Bone Density, Colonoscopy, Mammogram  [Encouraged Annual Health Screenings.]  Durable Medical Equipment (DME) BP Cuff, Glucomoter, Other  [Eyeglasses, Hand-Held Shower Hose, Engineer, materials in [place], Cane.]  PCP/Specialist Visits Compliance with follow-up visit  [Encouraged Routine Engagement with Care Team Members.]  Communication with PCP/Specialists, Charity fundraiser, Pharmacists, Social Work  [employer] Routine Engagement with Care Team Members.]  Level of Care Adult Daycare,  Air traffic controller, Assisted Living, Skilled Nursing Facility  [Confirmed Disinterest in Enrollment in Adult Day Care Program or Receiving Assistance Pursuing Higher Level of Care Placement Options (I.e Assisted Living Versus Skilled Nursing Facility).]  Applications Medicaid, Personal Care Services  [Confirmed Ineligibility for Medicaid & Personal Care Services.]  Exercise Interventions   Exercise Discussed/Reviewed Exercise Discussed, Exercise Reviewed, Physical Activity, Weight Managment, Assistive device use and maintanence  [Encouraged Routine Engagement in Activities of Interest.]  Physical Activity Discussed/Reviewed Physical Activity Discussed, Physical Activity Reviewed, Types of exercise, Home Exercise Program (HEP), PREP, Gym  [Encouraged Increased Level of Activity & Exercise, Inside & Outside the Home.]  Weight Management Weight maintenance  [Encouraged.]  Education Interventions   Education Provided Provided Education  [Educational Material Provided & Reviewed.]  Provided Verbal Education On Nutrition, Foot Care, Eye Care, Labs, Blood Sugar Monitoring, Mental Health/Coping with Illness, Applications, Exercise, Medication, When to see the doctor, [employer], Building services engineer, Entertained Questions to Ensure Understanding & Encouraged Implementation.]  Labs Reviewed Hgb A1c  [Encouraged Routine Monitoring.]  Applications Medicaid, Personal Care Services  [Confirmed Ineligibility for Medicaid & Personal Care Services.]  Mental Health Interventions   Mental Health Discussed/Reviewed Mental Health Discussed, Substance Abuse, Suicide, Mental Health Reviewed, Other, Coping Strategies, Crisis, Anxiety, Depression, Grief and Loss  [Assessed Mental Health & Cognitive Status.]  Nutrition Interventions   Nutrition Discussed/Reviewed Nutrition Discussed, Portion sizes, Decreasing sugar intake, Nutrition Reviewed, Increasing proteins, Carbohydrate meal  planning, Decreasing fats, Adding fruits and vegetables, Decreasing salt, Fluid intake, Supplemental nutrition  [Encouraged Heart-Healthy, Diabetic-Friendly, Low-Fat, Protein-Rich Diet.]  Pharmacy Interventions   Pharmacy Dicussed/Reviewed Pharmacy Topics Discussed, Affording Medications, Pharmacy Topics Reviewed, Medications and their functions, Medication Adherence  [Confirmed Prescription Medication Compliance & Ability to Afford Prescription Medications.]  Medication Adherence --  [  Administering Medications as Prescribed.]  Safety Interventions   Safety Discussed/Reviewed Safety Discussed, Safety Reviewed, Fall Risk, Home Safety  [Encouraged Routine Use of Home Safety Devices & Durable Medical Equipment.]  Home Safety Assistive Devices, Need for home safety assessment  [Encouraged Consideration of Home Safety Evaluation.]  Advanced Directive Interventions   Advanced Directives Discussed/Reviewed Advanced Directives Discussed, Advanced Directives Reviewed  Retail banker (Living Will & Healthcare Power of Attorney Documents) Initated & Copies Requested to Scan into Electronic Medical Record in Epic.]      Active Listening & Reflection Utilized.  Verbalization of Feelings Encouraged.  Emotional Support Provided. Cognitive Behavioral Therapy Indicated. Acceptance & Commitment Therapy Implemented. Client-Centered Therapy Initiated. Patient Admitted to Being Sick with Worry, As Brother, Ladene Artist Has Been Reported Missing Since 06/08/2023. Encouraged Daily Implementation of Deep Breathing Exercises, Relaxation Techniques & Mindfulness Meditation Strategies.  Encouraged Daily Journaling, As a Means of Expressing Thoughts, Feelings, Emotions, Symptoms, Etc. Encouraged Routine Engagement with Danford Bad, Licensed Clinical Social Worker with [facility] [phone]), if You Have Questions, Need Assistance, or If Additional Social Work Needs Are Identified Between Now &  Our Next Follow-Up Outreach Call, Scheduled on 07/01/2023 at 2:30 PM.      Our next appointment is by telephone on 07/01/2023 at 2:30 pm.  Please call the care guide team at [phone] if you need to cancel or reschedule your appointment.   If you are experiencing a Mental Health or Behavioral Health Crisis or need someone to talk to, please call the Suicide and Crisis Lifeline: 988 call the [place] National Suicide Prevention Lifeline: (203) 883-6919 or TTY: 587 825 7950 TTY 347 441 1949) to talk to a trained counselor call 1-800-273-TALK (toll free, 24 hour hotline) go to [facility] [address] [place] [phone]) call the [facility] Crisis Line: [phone] call 911  Patient verbalizes understanding of instructions and care plan provided today and agrees to view in MyChart. Active MyChart status and patient understanding of how to access instructions and care plan via MyChart confirmed with patient.     Telephone follow up appointment with care management team member scheduled for:  07/01/2023 at 2:30 pm.  Danford Bad, BSW, MSW, LCSW  [facility], [facility] Clinical Social Worker II Direct Dial: [phone]  Fax: [phone] Mailing Address: [facility] [address]  [place]  [address] Website: [email or web address]

## 2023-06-11 NOTE — Patient Outreach (Signed)
Care Coordination   Follow Up Visit Note   06/11/2023  Name: Gloria Owens MRN: [id number] DOB: 1935/10/02  Gloria Owens is a 88 y.o. year old female who sees Gilmore Laroche, FNP for primary care. I spoke with Gloria Owens by phone today.  What matters to the patients health and wellness today?  Provide Engineer, technical sales & Resources.    Goals Addressed               This Visit's Progress     Provide Caregiver Services & Resources. (pt-stated)   On track     Care Coordination Interventions:   Interventions Today    Flowsheet Row Most Recent Value  Chronic Disease   Chronic disease during today's visit Diabetes, Hypertension (HTN), Other  [Monoclonal Gammopathy of Undetermined Significance, Caregiver Stress, Burnout & Fatigue, Sleep Disturbance, Left Lower Quadrant Abdominal Pain, Diminished Appetite, Unintentional Weight Loss.]  General Interventions   General Interventions Discussed/Reviewed General Interventions Discussed, Lipid Profile, Doctor Visits, Durable Medical Equipment (DME), Communication with, General Interventions Reviewed, Annual Eye Exam, Labs, Annual Foot Exam, [employer], Health Screening, Vaccines, Level of Care  [Encouraged Routine Engagement with Care Team Members.]  Labs Hgb A1c every 3 months, Kidney Function  [Encouraged Routine Labwork.]  Vaccines COVID-19, Flu, Pneumonia, RSV, Shingles, Tetanus/Pertussis/Diphtheria  [Encouraged Annual Vaccinations.]  Doctor Visits Discussed/Reviewed Doctor Visits Discussed, Doctor Visits Reviewed, Annual Wellness Visits, PCP, Specialist  [Encouraged Routine Engagement with Care Team Members.]  Health Screening Bone Density, Colonoscopy, Mammogram  [Encouraged Annual Health Screenings.]  Durable Medical Equipment (DME) BP Cuff, Glucomoter, Other  [Eyeglasses, Hand-Held Shower Hose, Engineer, materials in [place], [place].]  PCP/Specialist Visits Compliance with follow-up visit  [Encouraged Routine Engagement with Care Team  Members.]  Communication with PCP/Specialists, Charity fundraiser, Pharmacists, Social Work  [employer] Routine Engagement with Care Team Members.]  Level of Care Adult Daycare, Air traffic controller, Assisted Living, Skilled Nursing Facility  [Confirmed Disinterest in Enrollment in Adult Day Care Program or Receiving Assistance Pursuing Higher Level of Care Placement Options (I.e Assisted Living Versus Skilled Nursing Facility).]  Applications Medicaid, Personal Care Services  [Confirmed Ineligibility for Medicaid & Personal Care Services.]  Exercise Interventions   Exercise Discussed/Reviewed Exercise Discussed, Exercise Reviewed, Physical Activity, Weight Managment, Assistive device use and maintanence  [Encouraged Routine Engagement in Activities of Interest.]  Physical Activity Discussed/Reviewed Physical Activity Discussed, Physical Activity Reviewed, Types of exercise, Home Exercise Program (HEP), PREP, Gym  [Encouraged Increased Level of Activity & Exercise, Inside & Outside the Home.]  Weight Management Weight maintenance  [Encouraged.]  Education Interventions   Education Provided Provided Education  [Educational Material Provided & Reviewed.]  Provided Verbal Education On Nutrition, Foot Care, Eye Care, Labs, Blood Sugar Monitoring, Mental Health/Coping with Illness, Applications, Exercise, Medication, When to see the doctor, [employer], Building services engineer, Entertained Questions to Ensure Understanding & Encouraged Implementation.]  Labs Reviewed Hgb A1c  [Encouraged Routine Monitoring.]  Applications Medicaid, Personal Care Services  [Confirmed Ineligibility for Medicaid & Personal Care Services.]  Mental Health Interventions   Mental Health Discussed/Reviewed Mental Health Discussed, Substance Abuse, Suicide, Mental Health Reviewed, Other, Coping Strategies, Crisis, Anxiety, Depression, Grief and Loss  [Assessed Mental Health & Cognitive Status.]  Nutrition  Interventions   Nutrition Discussed/Reviewed Nutrition Discussed, Portion sizes, Decreasing sugar intake, Nutrition Reviewed, Increasing proteins, Carbohydrate meal planning, Decreasing fats, Adding fruits and vegetables, Decreasing salt, Fluid intake, Supplemental nutrition  [Encouraged Heart-Healthy, Diabetic-Friendly, Low-Fat, Protein-Rich Diet.]  Pharmacy Interventions   Pharmacy Dicussed/Reviewed Pharmacy  Topics Discussed, Affording Medications, Pharmacy Topics Reviewed, Medications and their functions, Medication Adherence  [Confirmed Prescription Medication Compliance & Ability to Afford Prescription Medications.]  Medication Adherence --  [Administering Medications as Prescribed.]  Safety Interventions   Safety Discussed/Reviewed Safety Discussed, Safety Reviewed, Fall Risk, Home Safety  [Encouraged Routine Use of Home Safety Devices & Durable Medical Equipment.]  Home Safety Assistive Devices, Need for home safety assessment  [Encouraged Consideration of Home Safety Evaluation.]  Advanced Directive Interventions   Advanced Directives Discussed/Reviewed Advanced Directives Discussed, Advanced Directives Reviewed  Retail banker (Living Will & Healthcare Power of Attorney Documents) Initated & Copies Requested to Scan into Electronic Medical Record in Epic.]      Active Listening & Reflection Utilized.  Verbalization of Feelings Encouraged.  Emotional Support Provided. Cognitive Behavioral Therapy Indicated. Acceptance & Commitment Therapy Implemented. Client-Centered Therapy Initiated. Patient Admitted to Being Sick with Worry, As Brother, Ladene Artist Has Been Reported Missing Since 06/08/2023. Encouraged Daily Implementation of Deep Breathing Exercises, Relaxation Techniques & Mindfulness Meditation Strategies.  Encouraged Daily Journaling, As a Means of Expressing Thoughts, Feelings, Emotions, Symptoms, Etc. Encouraged Routine Engagement with Danford Bad, Licensed Clinical  Social Worker with [facility] [phone]), if You Have Questions, Need Assistance, or If Additional Social Work Needs Are Identified Between Now & Our Next Follow-Up Outreach Call, Scheduled on 07/01/2023 at 2:30 PM.      SDOH assessments and interventions completed:  Yes.  Care Coordination Interventions:  Yes, provided.   Follow up plan: Follow up call scheduled for 07/01/2023 at 2:30 pm.  Encounter Outcome:  Patient Visit Completed.   Danford Bad, BSW, MSW, LCSW  [facility], [facility] Clinical Social Worker II Direct Dial: [phone]  Fax: [phone] Mailing Address: [facility] [address]  [place]  [address] Website: [email or web address]

## 2023-06-12 ENCOUNTER — Encounter: Payer: Self-pay | Admitting: *Deleted

## 2023-06-15 ENCOUNTER — Ambulatory Visit: Payer: Medicare Other | Admitting: Family Medicine

## 2023-06-16 ENCOUNTER — Other Ambulatory Visit: Payer: Self-pay | Admitting: Family Medicine

## 2023-06-16 ENCOUNTER — Ambulatory Visit: Payer: Self-pay | Admitting: *Deleted

## 2023-06-16 ENCOUNTER — Encounter: Payer: Self-pay | Admitting: *Deleted

## 2023-06-16 DIAGNOSIS — E119 Type 2 diabetes mellitus without complications: Secondary | ICD-10-CM

## 2023-06-16 DIAGNOSIS — E1165 Type 2 diabetes mellitus with hyperglycemia: Secondary | ICD-10-CM

## 2023-06-16 NOTE — Patient Instructions (Signed)
Visit Information  Thank you for taking time to visit with me today. Please don't hesitate to contact me if I can be of assistance to you.   Following are the goals we discussed today:   Goals Addressed               This Visit's Progress     Provide Caregiver Services & Resources. (pt-stated)   On track     Care Coordination Interventions:   Interventions Today    Flowsheet Row Most Recent Value  Chronic Disease   Chronic disease during today's visit Diabetes, Hypertension (HTN), Other  [Monoclonal Gammopathy of Undetermined Significance, Caregiver Stress, Burnout & Fatigue, Sleep Disturbance, Left Lower Quadrant Abdominal Pain, Diminished Appetite, Unintentional Weight Loss.]  General Interventions   General Interventions Discussed/Reviewed General Interventions Discussed, Lipid Profile, Doctor Visits, Durable Medical Equipment (DME), Communication with, General Interventions Reviewed, Annual Eye Exam, Labs, Annual Foot Exam, [employer], Health Screening, Vaccines, Level of Care  [Encouraged Routine Engagement with Care Team Members.]  Labs Hgb A1c every 3 months, Kidney Function  [Encouraged Routine Labwork.]  Vaccines COVID-19, Flu, Pneumonia, RSV, Shingles, Tetanus/Pertussis/Diphtheria  [Encouraged Annual Vaccinations.]  Doctor Visits Discussed/Reviewed Doctor Visits Discussed, Doctor Visits Reviewed, Annual Wellness Visits, PCP, Specialist  [Encouraged Routine Engagement with Care Team Members.]  Health Screening Bone Density, Colonoscopy, Mammogram  [Encouraged Annual Health Screenings.]  Durable Medical Equipment (DME) BP Cuff, Glucomoter, Other  [Eyeglasses, Hand-Held Shower Hose, Engineer, materials in [place], Cane.]  PCP/Specialist Visits Compliance with follow-up visit  [Encouraged Routine Engagement with Care Team Members.]  Communication with PCP/Specialists, Charity fundraiser, Pharmacists, Social Work  [employer] Routine Engagement with Care Team Members.]  Level of Care Adult Daycare,  Air traffic controller, Assisted Living, Skilled Nursing Facility  [Confirmed Disinterest in Enrollment in Adult Day Care Program or Receiving Assistance Pursuing Higher Level of Care Placement Options (I.e Assisted Living Versus Skilled Nursing Facility).]  Applications Medicaid, Personal Care Services  [Confirmed Ineligibility for Medicaid & Personal Care Services.]  Exercise Interventions   Exercise Discussed/Reviewed Exercise Discussed, Exercise Reviewed, Physical Activity, Weight Managment, Assistive device use and maintanence  [Encouraged Routine Engagement in Activities of Interest.]  Physical Activity Discussed/Reviewed Physical Activity Discussed, Physical Activity Reviewed, Types of exercise, Home Exercise Program (HEP), PREP, Gym  [Encouraged Increased Level of Activity & Exercise, Inside & Outside the Home.]  Weight Management Weight maintenance  [Encouraged.]  Education Interventions   Education Provided Provided Education  [Educational Material Provided & Reviewed.]  Provided Verbal Education On Nutrition, Foot Care, Eye Care, Labs, Blood Sugar Monitoring, Mental Health/Coping with Illness, Applications, Exercise, Medication, When to see the doctor, [employer], Building services engineer, Entertained Questions to Ensure Understanding & Encouraged Implementation.]  Labs Reviewed Hgb A1c  [Encouraged Routine Monitoring.]  Applications Medicaid, Personal Care Services  [Confirmed Ineligibility for Medicaid & Personal Care Services.]  Mental Health Interventions   Mental Health Discussed/Reviewed Mental Health Discussed, Substance Abuse, Suicide, Mental Health Reviewed, Other, Coping Strategies, Crisis, Anxiety, Depression, Grief and Loss  [Assessed Mental Health & Cognitive Status.]  Nutrition Interventions   Nutrition Discussed/Reviewed Nutrition Discussed, Portion sizes, Decreasing sugar intake, Nutrition Reviewed, Increasing proteins, Carbohydrate meal  planning, Decreasing fats, Adding fruits and vegetables, Decreasing salt, Fluid intake, Supplemental nutrition  [Encouraged Heart-Healthy, Diabetic-Friendly, Low-Fat, Protein-Rich Diet.]  Pharmacy Interventions   Pharmacy Dicussed/Reviewed Pharmacy Topics Discussed, Affording Medications, Pharmacy Topics Reviewed, Medications and their functions, Medication Adherence  [Confirmed Prescription Medication Compliance & Ability to Afford Prescription Medications.]  Medication Adherence --  [  Administering Medications as Prescribed.]  Safety Interventions   Safety Discussed/Reviewed Safety Discussed, Safety Reviewed, Fall Risk, Home Safety  [Encouraged Routine Use of Home Safety Devices & Durable Medical Equipment.]  Home Safety Assistive Devices, Need for home safety assessment  [Encouraged Consideration of Home Safety Evaluation.]  Advanced Directive Interventions   Advanced Directives Discussed/Reviewed Advanced Directives Discussed, Advanced Directives Reviewed  Retail banker (Living Will & Healthcare Power of Attorney Documents) Initated & Copies Requested to Scan into Electronic Medical Record in Epic.]      Active Listening & Reflection Utilized.  Verbalization of Feelings Encouraged.  Emotional Support Provided. Cognitive Behavioral Therapy Indicated. Acceptance & Commitment Therapy Implemented. Client-Centered Therapy Initiated. Offered Sincerest Condolences for Tragic Death of Brother, Found Deceased on Jul 02, 2023. Encouraged Self-Enrollment in Grief & Loss Support Group of Interest in [facility], from List Provided. Encouraged Daily Implementation of Deep Breathing Exercises, Relaxation Techniques, & Mindfulness Meditation Strategies.  Encouraged Daily Journaling, As a Means of Expressing Thoughts, Feelings, Emotions, Symptoms, & Grief.  Encouraged Routine Engagement with Danford Bad, Licensed Clinical Social Worker with [facility] [phone]), if You  Have Questions, Need Assistance, or If Additional Social Work Needs Are Identified Between Now & Our Next Follow-Up Outreach Call, Scheduled on 07/01/2023 at 2:30 PM.      Our next appointment is by telephone on 07/01/2023 at 2:30 pm.  Please call the care guide team at [phone] if you need to cancel or reschedule your appointment.   If you are experiencing a Mental Health or Behavioral Health Crisis or need someone to talk to, please call the Suicide and Crisis Lifeline: 988 call the [place] National Suicide Prevention Lifeline: 339-038-1739 or TTY: 949-504-1009 TTY 7251655336) to talk to a trained counselor call 1-800-273-TALK (toll free, 24 hour hotline) go to [facility] [address] [place] [phone]) call the [facility] Crisis Line: [phone] call 911  Patient verbalizes understanding of instructions and care plan provided today and agrees to view in MyChart. Active MyChart status and patient understanding of how to access instructions and care plan via MyChart confirmed with patient.     Telephone follow up appointment with care management team member scheduled for:  07/01/2023 at 2:30 pm.  Danford Bad, BSW, MSW, LCSW [place]  [facility], [facility] Clinical Social Worker II Direct Dial: [phone]  Fax: [phone] Website: [email or web address]

## 2023-06-16 NOTE — Patient Outreach (Signed)
Care Coordination   Follow Up Visit Note   06/16/2023  Name: Gloria Owens MRN: [id number] DOB: Sep 16, 1935  Gloria Owens is a 88 y.o. year old female who sees Gilmore Laroche, FNP for primary care. I spoke with [name] by phone today.  What matters to the patients health and wellness today?  Provide Engineer, technical sales & Resources.     Goals Addressed               This Visit's Progress     Provide Caregiver Services & Resources. (pt-stated)   On track     Care Coordination Interventions:   Interventions Today    Flowsheet Row Most Recent Value  Chronic Disease   Chronic disease during today's visit Diabetes, Hypertension (HTN), Other  [Monoclonal Gammopathy of Undetermined Significance, Caregiver Stress, Burnout & Fatigue, Sleep Disturbance, Left Lower Quadrant Abdominal Pain, Diminished Appetite, Unintentional Weight Loss.]  General Interventions   General Interventions Discussed/Reviewed General Interventions Discussed, Lipid Profile, Doctor Visits, Durable Medical Equipment (DME), Communication with, General Interventions Reviewed, Annual Eye Exam, Labs, Annual Foot Exam, [employer], Health Screening, Vaccines, Level of Care  [Encouraged Routine Engagement with Care Team Members.]  Labs Hgb A1c every 3 months, Kidney Function  [Encouraged Routine Labwork.]  Vaccines COVID-19, Flu, Pneumonia, RSV, Shingles, Tetanus/Pertussis/Diphtheria  [Encouraged Annual Vaccinations.]  Doctor Visits Discussed/Reviewed Doctor Visits Discussed, Doctor Visits Reviewed, Annual Wellness Visits, PCP, Specialist  [Encouraged Routine Engagement with Care Team Members.]  Health Screening Bone Density, Colonoscopy, Mammogram  [Encouraged Annual Health Screenings.]  Durable Medical Equipment (DME) BP Cuff, Glucomoter, Other  [Eyeglasses, Hand-Held Shower Hose, Engineer, materials in [place], Cane.]  PCP/Specialist Visits Compliance with follow-up visit  [Encouraged Routine Engagement with Care  Team Members.]  Communication with PCP/Specialists, Charity fundraiser, Pharmacists, Social Work  [employer] Routine Engagement with Care Team Members.]  Level of Care Adult Daycare, Air traffic controller, Assisted Living, Skilled Nursing Facility  [Confirmed Disinterest in Enrollment in Adult Day Care Program or Receiving Assistance Pursuing Higher Level of Care Placement Options (I.e Assisted Living Versus Skilled Nursing Facility).]  Applications Medicaid, Personal Care Services  [Confirmed Ineligibility for Medicaid & Personal Care Services.]  Exercise Interventions   Exercise Discussed/Reviewed Exercise Discussed, Exercise Reviewed, Physical Activity, Weight Managment, Assistive device use and maintanence  [Encouraged Routine Engagement in Activities of Interest.]  Physical Activity Discussed/Reviewed Physical Activity Discussed, Physical Activity Reviewed, Types of exercise, Home Exercise Program (HEP), PREP, Gym  [Encouraged Increased Level of Activity & Exercise, Inside & Outside the Home.]  Weight Management Weight maintenance  [Encouraged.]  Education Interventions   Education Provided Provided Education  [Educational Material Provided & Reviewed.]  Provided Verbal Education On Nutrition, Foot Care, Eye Care, Labs, Blood Sugar Monitoring, Mental Health/Coping with Illness, Applications, Exercise, Medication, When to see the doctor, [employer], Building services engineer, Entertained Questions to Ensure Understanding & Encouraged Implementation.]  Labs Reviewed Hgb A1c  [Encouraged Routine Monitoring.]  Applications Medicaid, Personal Care Services  [Confirmed Ineligibility for Medicaid & Personal Care Services.]  Mental Health Interventions   Mental Health Discussed/Reviewed Mental Health Discussed, Substance Abuse, Suicide, Mental Health Reviewed, Other, Coping Strategies, Crisis, Anxiety, Depression, Grief and Loss  [Assessed Mental Health & Cognitive Status.]  Nutrition  Interventions   Nutrition Discussed/Reviewed Nutrition Discussed, Portion sizes, Decreasing sugar intake, Nutrition Reviewed, Increasing proteins, Carbohydrate meal planning, Decreasing fats, Adding fruits and vegetables, Decreasing salt, Fluid intake, Supplemental nutrition  [Encouraged Heart-Healthy, Diabetic-Friendly, Low-Fat, Protein-Rich Diet.]  Pharmacy Interventions   Pharmacy Dicussed/Reviewed  Pharmacy Topics Discussed, Affording Medications, Pharmacy Topics Reviewed, Medications and their functions, Medication Adherence  [Confirmed Prescription Medication Compliance & Ability to Afford Prescription Medications.]  Medication Adherence --  [Administering Medications as Prescribed.]  Safety Interventions   Safety Discussed/Reviewed Safety Discussed, Safety Reviewed, Fall Risk, Home Safety  [Encouraged Routine Use of Home Safety Devices & Durable Medical Equipment.]  Home Safety Assistive Devices, Need for home safety assessment  [Encouraged Consideration of Home Safety Evaluation.]  Advanced Directive Interventions   Advanced Directives Discussed/Reviewed Advanced Directives Discussed, Advanced Directives Reviewed  Retail banker (Living Will & Healthcare Power of Attorney Documents) Initated & Copies Requested to Scan into Electronic Medical Record in Epic.]      Active Listening & Reflection Utilized.  Verbalization of Feelings Encouraged.  Emotional Support Provided. Cognitive Behavioral Therapy Indicated. Acceptance & Commitment Therapy Implemented. Client-Centered Therapy Initiated. Offered Sincerest Condolences for Tragic Death of Brother, Found Deceased on 2023-07-15. Encouraged Self-Enrollment in Grief & Loss Support Group of Interest in [facility], from List Provided. Encouraged Daily Implementation of Deep Breathing Exercises, Relaxation Techniques, & Mindfulness Meditation Strategies.  Encouraged Daily Journaling, As a Means of Expressing Thoughts, Feelings,  Emotions, Symptoms, & Grief.  Encouraged Routine Engagement with Danford Bad, Licensed Clinical Social Worker with [facility] [phone]), if You Have Questions, Need Assistance, or If Additional Social Work Needs Are Identified Between Now & Our Next Follow-Up Outreach Call, Scheduled on 07/01/2023 at 2:30 PM.        SDOH assessments and interventions completed:  Yes.  SDOH Interventions Today    Flowsheet Row Most Recent Value  SDOH Interventions   Food Insecurity Interventions Intervention Not Indicated  Housing Interventions Intervention Not Indicated  Transportation Interventions Intervention Not Indicated, Patient Resources (Friends/Family)  Utilities Interventions Intervention Not Indicated  Alcohol Usage Interventions Intervention Not Indicated (Score <7)  Depression Interventions/Treatment  Medication, Counseling, Community Resources Provided  Financial Strain Interventions Intervention Not Indicated  Physical Activity Interventions Intervention Not Indicated  Stress Interventions Community Resources Provided, Offered [employer], Provide Counseling  Social Connections Interventions Intervention Not Indicated, Community Resources Provided  Health Literacy Interventions Intervention Not Indicated     Care Coordination Interventions:  Yes, provided.   Follow up plan: Follow up call scheduled for 07/01/2023 at 2:30 pm.  Encounter Outcome:  Patient Visit Completed.   Danford Bad, BSW, MSW, LCSW [facility], [facility] Clinical Social Worker II Direct Dial: [phone]  Fax: [phone] Website: [email or web address]

## 2023-06-23 ENCOUNTER — Ambulatory Visit: Payer: Self-pay | Admitting: *Deleted

## 2023-06-23 NOTE — Patient Instructions (Signed)
Visit Information  Thank you for taking time to visit with me today. Please don't hesitate to contact me if I can be of assistance to you.   Following are the goals we discussed today:   Goals Addressed               This Visit's Progress     Provide Caregiver Services & Resources. (pt-stated)   On track     Care Coordination Interventions:   Interventions Today    Flowsheet Row Most Recent Value  Chronic Disease   Chronic disease during today's visit Diabetes, Hypertension (HTN), Other  [Monoclonal Gammopathy of Undetermined Significance, Caregiver Stress, Burnout & Fatigue, Sleep Disturbance, Left Lower Quadrant Abdominal Pain, Diminished Appetite, Unintentional Weight Loss.]  General Interventions   General Interventions Discussed/Reviewed General Interventions Discussed, Lipid Profile, Doctor Visits, Durable Medical Equipment (DME), Communication with, General Interventions Reviewed, Annual Eye Exam, Labs, Annual Foot Exam, [employer], Health Screening, Vaccines, Level of Care  [Encouraged Routine Engagement with Care Team Members.]  Labs Hgb A1c every 3 months, Kidney Function  [Encouraged Routine Labwork.]  Vaccines COVID-19, Flu, Pneumonia, RSV, Shingles, Tetanus/Pertussis/Diphtheria  [Encouraged Annual Vaccinations.]  Doctor Visits Discussed/Reviewed Doctor Visits Discussed, Doctor Visits Reviewed, Annual Wellness Visits, PCP, Specialist  [Encouraged Routine Engagement with Care Team Members.]  Health Screening Bone Density, Colonoscopy, Mammogram  [Encouraged Annual Health Screenings.]  Durable Medical Equipment (DME) BP Cuff, Glucomoter, Other  [Eyeglasses, Hand-Held Shower Hose, Engineer, materials in [place], Cane.]  PCP/Specialist Visits Compliance with follow-up visit  [Encouraged Routine Engagement with Care Team Members.]  Communication with PCP/Specialists, Charity fundraiser, Pharmacists, Social Work  [employer] Routine Engagement with Care Team Members.]  Level of Care Adult Daycare,  Air traffic controller, Assisted Living, Skilled Nursing Facility  [Confirmed Disinterest in Enrollment in Adult Day Care Program or Receiving Assistance Pursuing Higher Level of Care Placement Options (I.e Assisted Living Versus Skilled Nursing Facility).]  Applications Medicaid, Personal Care Services  [Confirmed Ineligibility for Medicaid & Personal Care Services.]  Exercise Interventions   Exercise Discussed/Reviewed Exercise Discussed, Exercise Reviewed, Physical Activity, Weight Managment, Assistive device use and maintanence  [Encouraged Routine Engagement in Activities of Interest.]  Physical Activity Discussed/Reviewed Physical Activity Discussed, Physical Activity Reviewed, Types of exercise, Home Exercise Program (HEP), PREP, Gym  [Encouraged Increased Level of Activity & Exercise, Inside & Outside the Home.]  Weight Management Weight maintenance  [Encouraged.]  Education Interventions   Education Provided Provided Education  [Educational Material Provided & Reviewed.]  Provided Verbal Education On Nutrition, Foot Care, Eye Care, Labs, Blood Sugar Monitoring, Mental Health/Coping with Illness, Applications, Exercise, Medication, When to see the doctor, [employer], Building services engineer, Entertained Questions to Ensure Understanding & Encouraged Implementation.]  Labs Reviewed Hgb A1c  [Encouraged Routine Monitoring.]  Applications Medicaid, Personal Care Services  [Confirmed Ineligibility for Medicaid & Personal Care Services.]  Mental Health Interventions   Mental Health Discussed/Reviewed Mental Health Discussed, Substance Abuse, Suicide, Mental Health Reviewed, Other, Coping Strategies, Crisis, Anxiety, Depression, Grief and Loss  [Assessed Mental Health & Cognitive Status.]  Nutrition Interventions   Nutrition Discussed/Reviewed Nutrition Discussed, Portion sizes, Decreasing sugar intake, Nutrition Reviewed, Increasing proteins, Carbohydrate meal  planning, Decreasing fats, Adding fruits and vegetables, Decreasing salt, Fluid intake, Supplemental nutrition  [Encouraged Heart-Healthy, Diabetic-Friendly, Low-Fat, Protein-Rich Diet.]  Pharmacy Interventions   Pharmacy Dicussed/Reviewed Pharmacy Topics Discussed, Affording Medications, Pharmacy Topics Reviewed, Medications and their functions, Medication Adherence  [Confirmed Prescription Medication Compliance & Ability to Afford Prescription Medications.]  Medication Adherence --  [  Administering Medications as Prescribed.]  Safety Interventions   Safety Discussed/Reviewed Safety Discussed, Safety Reviewed, Fall Risk, Home Safety  [Encouraged Routine Use of Home Safety Devices & Durable Medical Equipment.]  Home Safety Assistive Devices, Need for home safety assessment  [Encouraged Consideration of Home Safety Evaluation.]  Advanced Directive Interventions   Advanced Directives Discussed/Reviewed Advanced Directives Discussed, Advanced Directives Reviewed  Retail banker (Living Will & Healthcare Power of Attorney Documents) Initated & Copies Requested to Scan into Electronic Medical Record in Epic.]      Active Listening & Reflection Utilized.  Verbalization of Feelings Encouraged.  Emotional Support Provided. Feelings of Grief Validated. Symptoms of Depression Acknowledged. Cognitive Behavioral Therapy Initiated. Acceptance & Commitment Therapy Performed. Encouraged Self-Enrollment in Grief & Loss Support Group of Interest in [facility], from List Provided. Encouraged Routine Engagement with Danford Bad, Licensed Clinical Social Worker with [facility], [facility] [phone]), if You Have Questions, Need Assistance, or If Additional Social Work Needs Are Identified Between Now & Our Next Follow-Up Outreach Call, Scheduled on 07/01/2023 at 2:30 PM.      Our next appointment is by telephone on 07/01/2023 at 2:30 pm.  Please call the  care guide team at [phone] if you need to cancel or reschedule your appointment.   If you are experiencing a Mental Health or Behavioral Health Crisis or need someone to talk to, please call the Suicide and Crisis Lifeline: 988 call the [place] National Suicide Prevention Lifeline: 702-505-0066 or TTY: (908)558-5080 TTY 743-067-9732) to talk to a trained counselor call 1-800-273-TALK (toll free, 24 hour hotline) go to [facility] [address] [place] [phone]) call the [facility] Crisis Line: 4380536310 call 911  Patient verbalizes understanding of instructions and care plan provided today and agrees to view in MyChart. Active MyChart status and patient understanding of how to access instructions and care plan via MyChart confirmed with patient.     Telephone follow up appointment with care management team member scheduled for: 07/01/2023 at 2:30 pm.   Danford Bad, BSW, MSW, LCSW [place]  [facility], [facility] Clinical Social Worker II Direct Dial: [phone]  Fax: [phone] Website: [email or web address]

## 2023-06-23 NOTE — Patient Outreach (Signed)
Care Coordination   Follow Up Visit Note   06/23/2023  Name: Gloria Owens MRN: [id number] DOB: April 26, 1936  Gloria Owens is a 88 y.o. year old female who sees Gloria Laroche, FNP for primary care. I spoke with Gloria Owens by phone today.  What matters to the patients health and wellness today?  Provide Engineer, technical sales & Resources.    Goals Addressed               This Visit's Progress     Provide Caregiver Services & Resources. (pt-stated)   On track     Care Coordination Interventions:   Interventions Today    Flowsheet Row Most Recent Value  Chronic Disease   Chronic disease during today's visit Diabetes, Hypertension (HTN), Other  [Monoclonal Gammopathy of Undetermined Significance, Caregiver Stress, Burnout & Fatigue, Sleep Disturbance, Left Lower Quadrant Abdominal Pain, Diminished Appetite, Unintentional Weight Loss.]  General Interventions   General Interventions Discussed/Reviewed General Interventions Discussed, Lipid Profile, Doctor Visits, Durable Medical Equipment (DME), Communication with, General Interventions Reviewed, Annual Eye Exam, Labs, Annual Foot Exam, [employer], Health Screening, Vaccines, Level of Care  [Encouraged Routine Engagement with Care Team Members.]  Labs Hgb A1c every 3 months, Kidney Function  [Encouraged Routine Labwork.]  Vaccines COVID-19, Flu, Pneumonia, RSV, Shingles, Tetanus/Pertussis/Diphtheria  [Encouraged Annual Vaccinations.]  Doctor Visits Discussed/Reviewed Doctor Visits Discussed, Doctor Visits Reviewed, Annual Wellness Visits, PCP, Specialist  [Encouraged Routine Engagement with Care Team Members.]  Health Screening Bone Density, Colonoscopy, Mammogram  [Encouraged Annual Health Screenings.]  Durable Medical Equipment (DME) BP Cuff, Glucomoter, Other  [Eyeglasses, Hand-Held Shower Hose, Engineer, materials in [place], Cane.]  PCP/Specialist Visits Compliance with follow-up visit  [Encouraged Routine Engagement with Care Team  Members.]  Communication with PCP/Specialists, Charity fundraiser, Pharmacists, Social Work  [employer] Routine Engagement with Care Team Members.]  Level of Care Adult Daycare, Air traffic controller, Assisted Living, Skilled Nursing Facility  [Confirmed Disinterest in Enrollment in Adult Day Care Program or Receiving Assistance Pursuing Higher Level of Care Placement Options (I.e Assisted Living Versus Skilled Nursing Facility).]  Applications Medicaid, Personal Care Services  [Confirmed Ineligibility for Medicaid & Personal Care Services.]  Exercise Interventions   Exercise Discussed/Reviewed Exercise Discussed, Exercise Reviewed, Physical Activity, Weight Managment, Assistive device use and maintanence  [Encouraged Routine Engagement in Activities of Interest.]  Physical Activity Discussed/Reviewed Physical Activity Discussed, Physical Activity Reviewed, Types of exercise, Home Exercise Program (HEP), PREP, Gym  [Encouraged Increased Level of Activity & Exercise, Inside & Outside the Home.]  Weight Management Weight maintenance  [Encouraged.]  Education Interventions   Education Provided Provided Education  [Educational Material Provided & Reviewed.]  Provided Verbal Education On Nutrition, Foot Care, Eye Care, Labs, Blood Sugar Monitoring, Mental Health/Coping with Illness, Applications, Exercise, Medication, When to see the doctor, [employer], Building services engineer, Entertained Questions to Ensure Understanding & Encouraged Implementation.]  Labs Reviewed Hgb A1c  [Encouraged Routine Monitoring.]  Applications Medicaid, Personal Care Services  [Confirmed Ineligibility for Medicaid & Personal Care Services.]  Mental Health Interventions   Mental Health Discussed/Reviewed Mental Health Discussed, Substance Abuse, Suicide, Mental Health Reviewed, Other, Coping Strategies, Crisis, Anxiety, Depression, Grief and Loss  [Assessed Mental Health & Cognitive Status.]  Nutrition  Interventions   Nutrition Discussed/Reviewed Nutrition Discussed, Portion sizes, Decreasing sugar intake, Nutrition Reviewed, Increasing proteins, Carbohydrate meal planning, Decreasing fats, Adding fruits and vegetables, Decreasing salt, Fluid intake, Supplemental nutrition  [Encouraged Heart-Healthy, Diabetic-Friendly, Low-Fat, Protein-Rich Diet.]  Pharmacy Interventions   Pharmacy Dicussed/Reviewed Pharmacy  Topics Discussed, Affording Medications, Pharmacy Topics Reviewed, Medications and their functions, Medication Adherence  [Confirmed Prescription Medication Compliance & Ability to Afford Prescription Medications.]  Medication Adherence --  [Administering Medications as Prescribed.]  Safety Interventions   Safety Discussed/Reviewed Safety Discussed, Safety Reviewed, Fall Risk, Home Safety  [Encouraged Routine Use of Home Safety Devices & Durable Medical Equipment.]  Home Safety Assistive Devices, Need for home safety assessment  [Encouraged Consideration of Home Safety Evaluation.]  Advanced Directive Interventions   Advanced Directives Discussed/Reviewed Advanced Directives Discussed, Advanced Directives Reviewed  Retail banker (Living Will & Healthcare Power of Attorney Documents) Initated & Copies Requested to Scan into Electronic Medical Record in Epic.]      Active Listening & Reflection Utilized.  Verbalization of Feelings Encouraged.  Emotional Support Provided. Feelings of Grief Validated. Symptoms of Depression Acknowledged. Cognitive Behavioral Therapy Initiated. Acceptance & Commitment Therapy Performed. Encouraged Self-Enrollment in Grief & Loss Support Group of Interest in [facility], from List Provided. Encouraged Routine Engagement with Danford Bad, Licensed Clinical Social Worker with [facility], [facility] [phone]), if You Have Questions, Need Assistance, or If Additional Social Work Needs Are Identified Between  Now & Our Next Follow-Up Outreach Call, Scheduled on 07/01/2023 at 2:30 PM.      SDOH assessments and interventions completed:  Yes.  Care Coordination Interventions:  Yes, provided.   Follow up plan: Follow up call scheduled for 07/01/2023 at 2:30 pm.  Encounter Outcome:  Patient Visit Completed.   Danford Bad, BSW, MSW, LCSW [facility], [facility] Clinical Social Worker II Direct Dial: [phone]  Fax: [phone] Website: [email or web address]

## 2023-07-01 ENCOUNTER — Ambulatory Visit: Payer: Self-pay | Admitting: *Deleted

## 2023-07-01 NOTE — Patient Instructions (Signed)
 Visit Information  Thank you for taking time to visit with me today. Please don't hesitate to contact me if I can be of assistance to you.   Following are the goals we discussed today:   Goals Addressed               This Visit's Progress     Provide Caregiver Services & Resources. (pt-stated)   On track     Care Coordination Interventions:  Interventions Today    Flowsheet Row Most Recent Value  Chronic Disease   Chronic disease during today's visit Diabetes, Hypertension (HTN), Other  [Monoclonal Gammopathy of Undetermined Significance, Caregiver Stress, Burnout & Fatigue, Sleep Disturbance, Left Lower Quadrant Abdominal Pain, Diminished Appetite, Unintentional Weight Loss, Recent Tragic Death of Brother, Grief & Loss.]  General Interventions   General Interventions Discussed/Reviewed General Interventions Discussed, General Interventions Reviewed, Communication with, Doctor Visits, Community Resources  [Encouraged Routine Engagement with Care Team Members & Providers.]  Doctor Visits Discussed/Reviewed Doctor Visits Discussed, Specialist, Doctor Visits Reviewed, Annual Wellness Visits, PCP  [Encouraged Routine Engagement with Care Team Members & Providers.]  PCP/Specialist Visits Compliance with follow-up visit  [Encouraged Routine Engagement with Care Team Members & Providers.]  Communication with PCP/Specialists, RN, Pharmacists, Social Work  [employer] Routine Engagement with Care Team Members & Providers.]  Education Interventions   Education Provided Provided Education  [employer] Reviewed Educational Material & Encouraged Implementation.]  Provided Verbal Education On When to see the doctor, Mental Health/Coping with Illness, Programmer, Applications  [Encouraged Routine Engagement with Care Team Members & Providers.]  Mental Health Interventions   Mental Health Discussed/Reviewed Mental Health Discussed, Anxiety, Depression, Grief and Loss, Mental Health Reviewed, Substance  Abuse, Coping Strategies, Suicide, Crisis, Other  [Assessed Mental Health & Cognitive Status. Offered Counseling & Supportive Services. Encouraged Self-Enrollment in Grief & Loss Support Group of Interest, from List Provided.]      Active Listening & Reflection Utilized.  Verbalization of Feelings Encouraged.  Emotional Support Provided. Feelings of Grief Validated. Symptoms of Depression Acknowledged. Cognitive Behavioral Therapy Performed. Acceptance & Commitment Therapy Indicated. Encouraged Routine Engagement with Claudell Rhody, Licensed Clinical Social Worker with [facility], [facility] [phone]), if You Have Questions, Need Assistance, or If Additional Social Work Needs Are Identified Between Now & Our Next Follow-Up Outreach Call, Scheduled on 07/29/2023 at 10:30 AM.      Our next appointment is by telephone on 07/29/2023 at 10:30 am.  Please call the care guide team at [phone] if you need to cancel or reschedule your appointment.   If you are experiencing a Mental Health or Behavioral Health Crisis or need someone to talk to, please call the Suicide and Crisis Lifeline: 988 call the [place]  National Suicide Prevention Lifeline: 959-830-5165 or TTY: 7344012590 TTY (815)793-7485) to talk to a trained counselor call 1-800-273-TALK (toll free, 24 hour hotline) go to [facility] [address], [place] [phone]) call the [facility] Crisis Line: ([phone] call 911  Patient verbalizes understanding of instructions and care plan provided today and agrees to view in MyChart. Active MyChart status and patient understanding of how to access instructions and care plan via MyChart confirmed with patient.     Telephone follow up appointment with care management team member scheduled for: 07/29/2023 at 10:30 am.   Philippe Desanctis, BSW, MSW, LCSW [place]  [facility],  [facility] Clinical Social Worker II Direct Dial: [phone]  Fax: [phone] Website: [email or web address]

## 2023-07-01 NOTE — Patient Outreach (Signed)
  Care Coordination   Follow Up Visit Note   07/01/2023  Name: Gloria Owens MRN: [id number] DOB: [birth year]  Gloria Owens is a 88 y.o. year old female who sees Zarwolo, Gloria, FNP for primary care. I spoke with Donia JULIANNA Solian by phone today.  What matters to the patients health and wellness today?  Encouraged Routine Engagement with Care Team Members & Providers.    Goals Addressed               This Visit's Progress     Provide Caregiver Services & Resources. (pt-stated)   On track     Care Coordination Interventions:  Interventions Today    Flowsheet Row Most Recent Value  Chronic Disease   Chronic disease during today's visit Diabetes, Hypertension (HTN), Other  [Monoclonal Gammopathy of Undetermined Significance, Caregiver Stress, Burnout & Fatigue, Sleep Disturbance, Left Lower Quadrant Abdominal Pain, Diminished Appetite, Unintentional Weight Loss, Recent Tragic Death of Brother, Grief & Loss.]  General Interventions   General Interventions Discussed/Reviewed General Interventions Discussed, General Interventions Reviewed, Communication with, Doctor Visits, Community Resources  [Encouraged Routine Engagement with Care Team Members & Providers.]  Doctor Visits Discussed/Reviewed Doctor Visits Discussed, Specialist, Doctor Visits Reviewed, Annual Wellness Visits, PCP  [Encouraged Routine Engagement with Care Team Members & Providers.]  PCP/Specialist Visits Compliance with follow-up visit  [Encouraged Routine Engagement with Care Team Members & Providers.]  Communication with PCP/Specialists, RN, Pharmacists, Social Work  [employer] Routine Engagement with Care Team Members & Providers.]  Education Interventions   Education Provided Provided Education  [employer] Reviewed Educational Material & Encouraged Implementation.]  Provided Verbal Education On When to see the doctor, Mental Health/Coping with Illness, Programmer, Applications  [Encouraged Routine Engagement with Care Team  Members & Providers.]  Mental Health Interventions   Mental Health Discussed/Reviewed Mental Health Discussed, Anxiety, Depression, Grief and Loss, Mental Health Reviewed, Substance Abuse, Coping Strategies, Suicide, Crisis, Other  [Assessed Mental Health & Cognitive Status. Offered Counseling & Supportive Services. Encouraged Self-Enrollment in Grief & Loss Support Group of Interest, from List Provided.]      Active Listening & Reflection Utilized.  Verbalization of Feelings Encouraged.  Emotional Support Provided. Feelings of Grief Validated. Symptoms of Depression Acknowledged. Cognitive Behavioral Therapy Performed. Acceptance & Commitment Therapy Indicated. Encouraged Routine Engagement with Xion Debruyne, Licensed Clinical Social Worker with [facility], [facility] [phone]), if You Have Questions, Need Assistance, or If Additional Social Work Needs Are Identified Between Now & Our Next Follow-Up Outreach Call, Scheduled on 07/29/2023 at 10:30 AM.      SDOH assessments and interventions completed:  Yes.  Care Coordination Interventions:  Yes, provided.   Follow up plan: Follow up call scheduled for 07/29/2023 at 10:30 am.  Encounter Outcome:  Patient Visit Completed.    Philippe Desanctis, BSW, MSW, LCSW [facility], [facility] Clinical Social Worker II Direct Dial: [phone]  Fax: [phone] Website: [email or web address]

## 2023-07-29 ENCOUNTER — Ambulatory Visit: Payer: Self-pay | Admitting: *Deleted

## 2023-07-29 ENCOUNTER — Encounter: Payer: Self-pay | Admitting: *Deleted

## 2023-07-29 NOTE — Patient Outreach (Signed)
 Care Coordination   Follow Up Visit Note   07/29/2023  Name: Gloria Owens MRN: [id number] DOB: 03-10-1936  Gloria Owens is a 88 y.o. year old female who sees Gilmore Laroche, FNP for primary care. I spoke with [name] by phone today.  What matters to the patients health and wellness today?  Provide Engineer, technical sales & Resources.    Goals Addressed               This Visit's Progress     COMPLETED: Provide Caregiver Services & Resources. (pt-stated)   On track     Care Coordination Interventions:  Interventions Today    Flowsheet Row Most Recent Value  Chronic Disease   Chronic disease during today's visit Diabetes, Hypertension (HTN), Other  [Monoclonal Gammopathy of Undetermined Significance, Caregiver Stress, Burnout & Fatigue, Sleep Disturbance, Left Lower Quadrant Abdominal Pain, Diminished Appetite, Unintentional Weight Loss, Recent Tragic Death of Brother, Grief & Loss.]  General Interventions   General Interventions Discussed/Reviewed General Interventions Discussed, General Interventions Reviewed, Communication with, Doctor Visits, Community Resources  [Encouraged Routine Engagement with Care Team Members & Providers.]  Doctor Visits Discussed/Reviewed Doctor Visits Discussed, Specialist, Doctor Visits Reviewed, Annual Wellness Visits, PCP  [Encouraged Routine Engagement with Care Team Members & Providers.]  PCP/Specialist Visits Compliance with follow-up visit  [Encouraged Routine Engagement with Care Team Members & Providers.]  Communication with PCP/Specialists, RN, Pharmacists, Social Work  [employer] Routine Engagement with Care Team Members & Providers.]  Education Interventions   Education Provided Provided Education  [employer] Reviewed Educational Material & Encouraged Implementation.]  Provided Verbal Education On When to see the doctor, Mental Health/Coping with Illness, Programmer, applications  [Encouraged Routine Engagement with Care Team Members &  Providers.]  Mental Health Interventions   Mental Health Discussed/Reviewed Mental Health Discussed, Anxiety, Depression, Grief and Loss, Mental Health Reviewed, Substance Abuse, Coping Strategies, Suicide, Crisis, Other  [Assessed Mental Health & Cognitive Status. Offered Counseling & Supportive Services. Encouraged Self-Enrollment in Grief & Loss Support Group of Interest, from List Provided.]      Active Listening & Reflection Utilized.  Verbalization of Feelings Encouraged.  Feelings of Grief Validated. Grief & Loss Support Provided. Encouraged Self-Enrollment in Grief & Loss Support Group of Interest, from List Provided. Cognitive Behavioral Therapy Initiated. Acceptance & Commitment Therapy Performed. Client-Centered Therapy Indicated. Encouraged Engagement with Danford Bad, Licensed Clinical Social Worker with [facility], [facility] [phone]), if You Have Questions, Need Assistance, Additional Social Work Needs Are Identified in The Near Future, or If You Change Your Mind About Wanting to Receive Social Work Services.        SDOH assessments and interventions completed:  Yes.  SDOH Interventions Today    Flowsheet Row Most Recent Value  SDOH Interventions   Food Insecurity Interventions Intervention Not Indicated  Housing Interventions Intervention Not Indicated  Transportation Interventions Intervention Not Indicated, Patient Resources (Friends/Family)  Utilities Interventions Intervention Not Indicated  Alcohol Usage Interventions Intervention Not Indicated (Score <7)  Depression Interventions/Treatment  Medication, Counseling, Currently on Treatment  Financial Strain Interventions Intervention Not Indicated  Physical Activity Interventions Intervention Not Indicated  Stress Interventions Intervention Not Indicated  Social Connections Interventions Intervention Not Indicated  Health Literacy Interventions Intervention Not  Indicated     Care Coordination Interventions:  Yes, provided.   Follow up plan: No further intervention required.   Encounter Outcome:  Patient Visit Completed.   Danford Bad, BSW, MSW, LCSW [facility], [employer]  Health Clinical Social Worker II Direct Dial: [phone]  Fax: [phone] Website: [email or web address]

## 2023-07-29 NOTE — Patient Instructions (Signed)
 Visit Information  Thank you for taking time to visit with me today. Please don't hesitate to contact me if I can be of assistance to you.   Following are the goals we discussed today:   Goals Addressed               This Visit's Progress     COMPLETED: Provide Caregiver Services & Resources. (pt-stated)   On track     Care Coordination Interventions:  Interventions Today    Flowsheet Row Most Recent Value  Chronic Disease   Chronic disease during today's visit Diabetes, Hypertension (HTN), Other  [Monoclonal Gammopathy of Undetermined Significance, Caregiver Stress, Burnout & Fatigue, Sleep Disturbance, Left Lower Quadrant Abdominal Pain, Diminished Appetite, Unintentional Weight Loss, Recent Tragic Death of Brother, Grief & Loss.]  General Interventions   General Interventions Discussed/Reviewed General Interventions Discussed, General Interventions Reviewed, Communication with, Doctor Visits, Community Resources  [Encouraged Routine Engagement with Care Team Members & Providers.]  Doctor Visits Discussed/Reviewed Doctor Visits Discussed, Specialist, Doctor Visits Reviewed, Annual Wellness Visits, PCP  [Encouraged Routine Engagement with Care Team Members & Providers.]  PCP/Specialist Visits Compliance with follow-up visit  [Encouraged Routine Engagement with Care Team Members & Providers.]  Communication with PCP/Specialists, RN, Pharmacists, Social Work  [employer] Routine Engagement with Care Team Members & Providers.]  Education Interventions   Education Provided Provided Education  [employer] Reviewed Educational Material & Encouraged Implementation.]  Provided Verbal Education On When to see the doctor, Mental Health/Coping with Illness, Programmer, applications  [Encouraged Routine Engagement with Care Team Members & Providers.]  Mental Health Interventions   Mental Health Discussed/Reviewed Mental Health Discussed, Anxiety, Depression, Grief and Loss, Mental Health Reviewed,  Substance Abuse, Coping Strategies, Suicide, Crisis, Other  [Assessed Mental Health & Cognitive Status. Offered Counseling & Supportive Services. Encouraged Self-Enrollment in Grief & Loss Support Group of Interest, from List Provided.]      Active Listening & Reflection Utilized.  Verbalization of Feelings Encouraged.  Feelings of Grief Validated. Grief & Loss Support Provided. Encouraged Self-Enrollment in Grief & Loss Support Group of Interest, from List Provided. Cognitive Behavioral Therapy Initiated. Acceptance & Commitment Therapy Performed. Client-Centered Therapy Indicated. Encouraged Engagement with Danford Bad, Licensed Clinical Social Worker with [facility], [facility] [phone]), if You Have Questions, Need Assistance, Additional Social Work Needs Are Identified in The Near Future, or If You Change Your Mind About Wanting to Receive Social Work Services.      Please call the care guide team at [phone] if you need to cancel or reschedule your appointment.   If you are experiencing a Mental Health or Behavioral Health Crisis or need someone to talk to, please call the Suicide and Crisis Lifeline: 988 call the [place] National Suicide Prevention Lifeline: (716) 064-4185 or TTY: 517 343 3801 TTY 727-302-1188) to talk to a trained counselor call 1-800-273-TALK (toll free, 24 hour hotline) go to [facility] [address] [place] [phone]) call the [facility] Crisis Line: [phone] call 911  Patient verbalizes understanding of instructions and care plan provided today and agrees to view in MyChart. Active MyChart status and patient understanding of how to access instructions and care plan via MyChart confirmed with patient.     No further follow up required.   Danford Bad, BSW, MSW, LCSW [facility], [facility] Clinical Social Worker  II Direct Dial: [phone]  Fax: [phone] Website: [email or web address]

## 2023-08-19 ENCOUNTER — Ambulatory Visit: Payer: Medicare Other | Admitting: Urology

## 2023-08-19 VITALS — BP 133/67 | HR 89

## 2023-08-19 DIAGNOSIS — N390 Urinary tract infection, site not specified: Secondary | ICD-10-CM

## 2023-08-19 DIAGNOSIS — R351 Nocturia: Secondary | ICD-10-CM

## 2023-08-19 DIAGNOSIS — Z87440 Personal history of urinary (tract) infections: Secondary | ICD-10-CM

## 2023-08-19 MED ORDER — SULFAMETHOXAZOLE-TRIMETHOPRIM 800-160 MG PO TABS: 1.0000 | ORAL_TABLET | Freq: Two times a day (BID) | ORAL | 3 refills | Status: DC

## 2023-08-19 MED ORDER — SULFAMETHOXAZOLE-TRIMETHOPRIM 800-160 MG PO TABS: 1.0000 | ORAL_TABLET | Freq: Two times a day (BID) | ORAL | 7 refills | Status: DC

## 2023-08-19 MED ORDER — NITROFURANTOIN MACROCRYSTAL 50 MG PO CAPS: 50.0000 mg | ORAL_CAPSULE | Freq: Every day | ORAL | 11 refills | Status: DC

## 2023-08-19 NOTE — Progress Notes (Signed)
 08/19/2023 3:28 PM   Gloria Owens 02-Feb-1936 [id number]  Referring provider: Gilmore Laroche, FNP [address] [place],  [place] [address]  Frequent UTIs.   HPI: Ms Gloria Owens is a 88yo here for followup for frequent UTI. She has had 2 UTIs since last visit for which she took bactrim. UA today shows RBCs, WBCS. No hematuria or dysuria. She denies worsening urinating urgency and frequency.     PMH: Past Medical History:  Diagnosis Date   Hypercholesteremia    Seizures (HCC)    with eye surgery several years ago-unknown etiology-no meds    Surgical History: Past Surgical History:  Procedure Laterality Date   ABDOMINAL HYSTERECTOMY     total hysterectomy   BACK SURGERY     fusion of back x2 and 1 other back surgery   CHOLECYSTECTOMY     COLON RESECTION     from endometriosis   CYSTOSCOPY     fulgeration of tumors   HERNIA REPAIR Right    INGUINAL HERNIA REPAIR Left 06/20/2013   Procedure: HERNIA REPAIR INGUINAL ADULT;  Surgeon: Dalia Heading, MD;  Location: [facility];  Service: General;  Laterality: Left;   INSERTION OF MESH Left 06/20/2013   Procedure: INSERTION OF MESH;  Surgeon: Dalia Heading, MD;  Location: [facility];  Service: General;  Laterality: Left;    Home Medications:  Allergies as of 08/19/2023       Reactions   Penicillins         Medication List        Accurate as of August 19, 2023  3:28 PM. If you have any questions, ask your nurse or doctor.          aspirin EC 81 MG tablet Take 81 mg by mouth daily.   Calcium Carb-Cholecalciferol 600-20 MG-MCG Tabs Commonly known as: Caltrate 600+D3 Take 600 mg by mouth 2 (two) times daily.   cholecalciferol 25 MCG (1000 UNIT) tablet Commonly known as: VITAMIN D3 Take 2,000 Units by mouth daily.   feeding supplement (GLUCERNA SHAKE) Liqd Take 237 mLs by mouth 3 (three) times daily between meals.   Iron (Ferrous Sulfate) 325 (65 Fe) MG Tabs Take 325 mg by mouth daily.   metFORMIN 500 MG  tablet Commonly known as: GLUCOPHAGE Take 1 tablet (500 mg total) by mouth 2 (two) times daily with a meal.   nitrofurantoin 50 MG capsule Commonly known as: Macrodantin Take 1 capsule (50 mg total) by mouth at bedtime.   olmesartan 20 MG tablet Commonly known as: BENICAR Take 1 tablet (20 mg total) by mouth daily.   simvastatin 40 MG tablet Commonly known as: ZOCOR Take 1 tablet (40 mg total) by mouth daily.   sulfamethoxazole-trimethoprim 800-160 MG tablet Commonly known as: BACTRIM DS Take 1 tablet by mouth every 12 (twelve) hours.        Allergies:  Allergies  Allergen Reactions   Penicillins     Family History: No family history on file.  Social History:  reports that she has never smoked. She has never been exposed to tobacco smoke. She has never used smokeless tobacco. She reports that she does not drink alcohol and does not use drugs.  ROS: All other review of systems were reviewed and are negative except what is noted above in HPI  Physical Exam: BP 133/67   Pulse 89   Constitutional:  Alert and oriented, No acute distress. HEENT: [place] AT, moist mucus membranes.  Trachea midline, no masses. Cardiovascular: No  clubbing, cyanosis, or edema. Respiratory: Normal respiratory effort, no increased work of breathing. GI: Abdomen is soft, nontender, nondistended, no abdominal masses GU: No CVA tenderness.  Lymph: No cervical or inguinal lymphadenopathy. Skin: No rashes, bruises or suspicious lesions. Neurologic: Grossly intact, no focal deficits, moving all 4 extremities. Psychiatric: Normal mood and affect.  Laboratory Data: Lab Results  Component Value Date   WBC 6.0 04/14/2023   HGB 14.3 04/14/2023   HCT 42.0 04/14/2023   MCV 100.2 (H) 04/14/2023   PLT 219 04/14/2023    Lab Results  Component Value Date   CREATININE 0.70 04/14/2023    No results found for: "PSA"  No results found for: "TESTOSTERONE"  Lab Results  Component Value Date   HGBA1C  6.5 (H) 02/12/2023    Urinalysis    Component Value Date/Time   APPEARANCEUR Cloudy (A) 11/17/2022 1431   GLUCOSEU Negative 11/17/2022 1431   BILIRUBINUR Negative 11/17/2022 1431   KETONESUR negative 04/21/2022 1545   PROTEINUR Negative 11/17/2022 1431   UROBILINOGEN 0.2 11/11/2022 1503   NITRITE Positive (A) 11/17/2022 1431   LEUKOCYTESUR 2+ (A) 11/17/2022 1431    Lab Results  Component Value Date   LABMICR See below: 11/17/2022   WBCUA >30 (A) 11/17/2022   LABEPIT 0-10 11/17/2022   BACTERIA Many (A) 11/17/2022    Pertinent Imaging:  Results for orders placed during the hospital encounter of 05/07/06  DG Abd 1 View  Narrative Clinical Data:  Postop. Abdominal symptoms. ABDOMEN - 1 VIEW: Findings: Mild gaseous distention of multiple small bowel loops compatible with nonspecific generalized ileus. Fairly generous amount of stool in the colon.  Transpedicle screws and interbody spacers at L4-5. The patient has posterolateral bony fusion as well.  Impression Query mild constipation. Nonspecific ileus.  Provider: Vickie Epley  No results found for this or any previous visit.  No results found for this or any previous visit.  No results found for this or any previous visit.  No results found for this or any previous visit.  No results found for this or any previous visit.  No results found for this or any previous visit.  No results found for this or any previous visit.   Assessment & Plan:    1. Recurrent UTI (Primary) Continue self start bactrim. Followup in 1 year - Urinalysis, Routine w reflex microscopic   No follow-ups on file.  Wilkie Aye, MD  [facility] [place]

## 2023-08-20 LAB — URINALYSIS, ROUTINE W REFLEX MICROSCOPIC
Bilirubin, UA: NEGATIVE
Glucose, UA: NEGATIVE
Ketones, UA: NEGATIVE
Nitrite, UA: POSITIVE — AB
Protein,UA: NEGATIVE
Specific Gravity, UA: <1.005 — ABNORMAL LOW (ref 1.005–1.030)
Urobilinogen, Ur: 0.2 mg/dL (ref 0.2–1.0)
pH, UA: 6 (ref 5.0–7.5)

## 2023-08-20 LAB — MICROSCOPIC EXAMINATION: WBC, UA: >30 /HPF — AB (ref 0–5)

## 2023-08-22 LAB — URINE CULTURE

## 2023-08-23 ENCOUNTER — Encounter: Payer: Self-pay | Admitting: Urology

## 2023-08-23 NOTE — Patient Instructions (Signed)

## 2023-09-16 ENCOUNTER — Other Ambulatory Visit: Payer: Self-pay | Admitting: Family Medicine

## 2023-09-16 DIAGNOSIS — E119 Type 2 diabetes mellitus without complications: Secondary | ICD-10-CM

## 2023-09-16 DIAGNOSIS — E1165 Type 2 diabetes mellitus with hyperglycemia: Secondary | ICD-10-CM

## 2023-10-13 ENCOUNTER — Other Ambulatory Visit (HOSPITAL_COMMUNITY): Payer: Self-pay

## 2023-10-13 ENCOUNTER — Encounter: Payer: Self-pay | Admitting: Hematology

## 2023-10-20 ENCOUNTER — Inpatient Hospital Stay: Payer: Medicare Other | Attending: Hematology

## 2023-10-20 DIAGNOSIS — D472 Monoclonal gammopathy: Secondary | ICD-10-CM | POA: Insufficient documentation

## 2023-10-20 DIAGNOSIS — D508 Other iron deficiency anemias: Secondary | ICD-10-CM

## 2023-10-20 DIAGNOSIS — R768 Other specified abnormal immunological findings in serum: Secondary | ICD-10-CM

## 2023-10-20 DIAGNOSIS — D509 Iron deficiency anemia, unspecified: Secondary | ICD-10-CM | POA: Insufficient documentation

## 2023-10-20 LAB — COMPREHENSIVE METABOLIC PANEL WITH GFR
ALT: 16 U/L (ref 0–44)
AST: 18 U/L (ref 15–41)
Albumin: 4.1 g/dL (ref 3.5–5.0)
Alkaline Phosphatase: 53 U/L (ref 38–126)
Anion gap: 11 (ref 5–15)
BUN: 25 mg/dL — ABNORMAL HIGH (ref 8–23)
CO2: 23 mmol/L (ref 22–32)
Calcium: 9.5 mg/dL (ref 8.9–10.3)
Chloride: 101 mmol/L (ref 98–111)
Creatinine, Ser: 0.75 mg/dL (ref 0.44–1.00)
GFR, Estimated: >60 mL/min (ref >60)
Glucose, Bld: 150 mg/dL — ABNORMAL HIGH (ref 70–99)
Potassium: 3.7 mmol/L (ref 3.5–5.1)
Sodium: 135 mmol/L (ref 135–145)
Total Bilirubin: 0.7 mg/dL (ref 0.0–1.2)
Total Protein: 7.5 g/dL (ref 6.5–8.1)

## 2023-10-20 LAB — CBC WITH DIFFERENTIAL/PLATELET
Abs Immature Granulocytes: 0.04 10*3/uL (ref 0.00–0.07)
Basophils Absolute: 0 10*3/uL (ref 0.0–0.1)
Basophils Relative: 0 %
Eosinophils Absolute: 0.1 10*3/uL (ref 0.0–0.5)
Eosinophils Relative: 1 %
HCT: 42 % (ref 36.0–46.0)
Hemoglobin: 14.2 g/dL (ref 12.0–15.0)
Immature Granulocytes: 0 %
Lymphocytes Relative: 12 %
Lymphs Abs: 1.1 10*3/uL (ref 0.7–4.0)
MCH: 33.3 pg (ref 26.0–34.0)
MCHC: 33.8 g/dL (ref 30.0–36.0)
MCV: 98.4 fL (ref 80.0–100.0)
Monocytes Absolute: 0.6 10*3/uL (ref 0.1–1.0)
Monocytes Relative: 7 %
Neutro Abs: 7.2 10*3/uL (ref 1.7–7.7)
Neutrophils Relative %: 80 %
Platelets: 240 10*3/uL (ref 150–400)
RBC: 4.27 MIL/uL (ref 3.87–5.11)
RDW: 12.9 % (ref 11.5–15.5)
WBC: 9.1 10*3/uL (ref 4.0–10.5)
nRBC: 0 % (ref 0.0–0.2)

## 2023-10-20 LAB — FERRITIN: Ferritin: 64 ng/mL (ref 11–307)

## 2023-10-20 LAB — LACTATE DEHYDROGENASE: LDH: 108 U/L (ref 98–192)

## 2023-10-20 LAB — IRON AND TIBC
Iron: 38 ug/dL (ref 28–170)
Saturation Ratios: 10 % — ABNORMAL LOW (ref 10.4–31.8)
TIBC: 378 ug/dL (ref 250–450)
UIBC: 340 ug/dL

## 2023-10-21 LAB — KAPPA/LAMBDA LIGHT CHAINS
Kappa free light chain: 349 mg/L — ABNORMAL HIGH (ref 3.3–19.4)
Kappa, lambda light chain ratio: 31.16 — ABNORMAL HIGH (ref 0.26–1.65)
Lambda free light chains: 11.2 mg/L (ref 5.7–26.3)

## 2023-10-22 LAB — PROTEIN ELECTROPHORESIS, SERUM
A/G Ratio: 1.2 (ref 0.7–1.7)
Albumin ELP: 3.7 g/dL (ref 2.9–4.4)
Alpha-1-Globulin: 0.2 g/dL (ref 0.0–0.4)
Alpha-2-Globulin: 0.9 g/dL (ref 0.4–1.0)
Beta Globulin: 1.2 g/dL (ref 0.7–1.3)
Gamma Globulin: 0.8 g/dL (ref 0.4–1.8)
Globulin, Total: 3.1 g/dL (ref 2.2–3.9)
Total Protein ELP: 6.8 g/dL (ref 6.0–8.5)

## 2023-10-23 LAB — IMMUNOFIXATION ELECTROPHORESIS
IgA: 142 mg/dL (ref 64–422)
IgG (Immunoglobin G), Serum: 937 mg/dL (ref 586–1602)
IgM (Immunoglobulin M), Srm: 68 mg/dL (ref 26–217)
Total Protein ELP: 7 g/dL (ref 6.0–8.5)

## 2023-10-26 NOTE — Progress Notes (Signed)
 [facility] [address], [place] [address]   CLINIC:  Medical Oncology/Hematology  PCP:  Gloria Owens, Gloria, FNP [address] [place] [address] [phone]   REASON FOR VISIT:  Follow-up for iron  deficiency anemia and MGUS   CURRENT THERAPY: Intermittent IV iron  + daily iron  tablet  INTERVAL HISTORY:  [name] is contacted today for follow-up of iron  deficiency anemia and MGUS.  She was evaluated via telemedicine visit by Gloria Dines PA-C on 04/21/2023.  At today's visit, she reports feeling fairly well in terms of her health, but is still dealing with the grief of her brother passing away in January 2025 (she had been his caregiver for 7 years).    Otherwise, she reports mostly good energy, but with some increased fatigue over the past few weeks. She reports stool that is dark from iron  supplementation but denies any bright red blood per rectum or melena.  Bowel movements have improved after taking Colace along with iron  supplement.  No recurrent ice pica.  She has occasional headaches at baseline.   She denies any chest pain, dyspnea on exertion, lightheadedness, or syncope.   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  She denies any B symptoms.  She has been previously found to have sigmoid colon stenosis and experiences some chronic constipation related to this.   She has 80% energy and 70% appetite.   She has lost 3 pounds since her visit 6 months ago.  ASSESSMENT & PLAN:  1.  Severe iron  deficiency anemia: - Labs from 10/27/2022 showed hemoglobin 9, ferritin 9, iron  saturation 4% - History of PRBC transfusions required at the time of back surgeries - Additional hematology workup (12/18/2022) showed normal B12, folate, MMA, copper . - Taking iron  tablet daily since June 2024  - Feraheme x 2 on 11/06/2022 and 11/14/2022 -  No prior EGD.  Patient denies any recent colonoscopies - reports exploratory laparotomy in 1967, found to have extensive endometriosis.  Exploratory laparotomy in 1970 with total abdominal hysterectomy and sigmoid resection with reanastomosis.  Details of procedure are not available.  Patient reports that she previously had attempts at colonoscopy, which were unsuccessful due to stenosis, reports that "sigmoid colon is no larger than an English pea and therefore evaluated by barium enemas only" - Barium enema of colon (03/28/2022): Diverticulosis, no evidence of malignancy. - CT abdomen/pelvis (12/12/2022): No evidence of stomach or bowel malignancy, but she does have extensive colonic diverticulosis.  No pathologically enlarged lymph nodes.  Additional findings including bilateral adnexal cysts are being worked up by her PCP. - Reports dark stool after starting iron  tablets, but denies any gross rectal bleeding or melena - Most recent labs (10/20/2023): Hgb 14.2, ferritin 64, iron  saturation 10% - PLAN:  Recommend IV iron  x 1 dose (Feraheme) - Continue daily iron  supplement with stool softener - Recheck in 6 months and consider additional testing (i.e. stool cards and referral to GI for EGD/barium enema of colon) if recurrent anemia.   2.  IgA kappa light chain MGUS - Workup of anemia revealed significantly elevated kappa free light chain 265.2, with normal lambda free light chain 9.7, elevated FLC ratio 27.34.  SPEP was negative. - Additional labs (12/31/2022): Immunofixation = faint band in IgA, light chain specificity unable to be verified. Creatinine 0.68, calcium  9.5, Hgb 12.9. Beta-2  microglobulin 1.9, LDH 118. - 24-hour urine/UPEP (  01/06/2023): Urine immunofixation negative.  Urine M spike not observed. - Skeletal survey (12/31/2022): No lytic or sclerotic lesions identified. - Most recent MGUS/myeloma panel (10/20/2023): Immunofixation shows IgA kappa monoclonal gammopathy SPEP negative for M  spike Elevated kappa free light chain 349.0, normal lambda 11.2, elevated FLC ratio 31.16 Normal LDH.  Creatinine 0.75, calcium  9.5, Hgb 14.2 - She denies any neurologic changes, B symptoms, or new onset bone pain  - We have discussed diagnosis of MGUS and need for ongoing monitoring due to risk of progression to multiple myeloma. - PLAN: Repeat MGUS/myeloma panel in 6 months - Annual skeletal survey and 24-hour urine due in 6 months   3.  Social/family history: - Lives by herself at home and also takes care of her brother.  She is able to do all her ADLs and also mows her 2.5 acres land.  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  Non-smoker.  No chemical exposure. - 4 brothers had lung cancer.  1 of those brothers also had leukemia.  1 sister had pancreatic cancer.  Niece and nephew had cancer.   PLAN SUMMARY:  >> IV Feraheme x 1 >> Labs in 6 months = CBC/D, CMP, SPEP, light chains, immunofixation, LDH, ferritin, iron /TIBC >> SKELETAL survey in 6 months >> 24-hour urine/UPEP in 6 months >> OFFICE visit in 6 months (1 week after labs)     REVIEW OF SYSTEMS:   Review of Systems  Constitutional:  Positive for fatigue. Negative for appetite change, chills, diaphoresis, fever and unexpected weight change.  HENT:   Negative for lump/mass and nosebleeds.   Eyes:  Negative for eye problems.  Respiratory:  Negative for cough, hemoptysis and shortness of breath.   Cardiovascular:  Negative for chest pain, leg swelling and palpitations.  Gastrointestinal:  Negative for abdominal pain, blood in stool, constipation, diarrhea, nausea and vomiting.  Genitourinary:  Negative for hematuria.   Musculoskeletal:  Positive for back pain.  Skin: Negative.   Neurological:  Negative for dizziness, headaches and light-headedness.  Hematological:  Does not bruise/bleed easily.  Psychiatric/Behavioral:  Positive for sleep disturbance.      PHYSICAL EXAM:  ECOG PERFORMANCE STATUS: 1 - Symptomatic but completely  ambulatory  Vitals:   10/27/23 1323  BP: (!) 123/54  Pulse: 78  Resp: 16  Temp: 97.8 F (36.6 C)  SpO2: 98%   Filed Weights   10/27/23 1323  Weight: 139 lb (63 kg)   Physical Exam Constitutional:      Appearance: Normal appearance. She is normal weight.  Cardiovascular:     Heart sounds: Normal heart sounds.  Pulmonary:     Breath sounds: Normal breath sounds.  Neurological:     General: No focal deficit present.     Mental Status: Mental status is at baseline.  Psychiatric:        Behavior: Behavior normal. Behavior is cooperative.     PAST MEDICAL/SURGICAL HISTORY:  Past Medical History:  Diagnosis Date   Hypercholesteremia    Seizures (HCC)    with eye surgery several years ago-unknown etiology-no meds   Past Surgical History:  Procedure Laterality Date   ABDOMINAL HYSTERECTOMY     total hysterectomy   BACK SURGERY     fusion of back x2 and 1 other back surgery   CHOLECYSTECTOMY     COLON RESECTION     from endometriosis   CYSTOSCOPY     fulgeration of tumors   HERNIA REPAIR Right    INGUINAL HERNIA REPAIR Left 06/20/2013  Procedure: HERNIA REPAIR INGUINAL ADULT;  Surgeon: Beau Bound, MD;  Location: [facility];  Service: General;  Laterality: Left;   INSERTION OF MESH Left 06/20/2013   Procedure: INSERTION OF MESH;  Surgeon: Beau Bound, MD;  Location: [facility];  Service: General;  Laterality: Left;    SOCIAL HISTORY:  Social History   Socioeconomic History   Marital status: Widowed    Spouse name: Not on file   Number of children: 0   Years of education: 8   Highest education level: 12th grade  Occupational History   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  Tobacco Use   Smoking status: Never    Passive exposure: Never   Smokeless tobacco: Never  Vaping Use   Vaping status: Never Used  Substance and Sexual Activity   Alcohol use: No   Drug use: No   Sexual activity: Not Currently    Partners: Male    Birth  control/protection: Surgical  Other Topics Concern   Not on file  Social History Narrative   No children- had endometriosis an dhysterectomy; husband recently deceased   Nephew is Anjalina Bergevin- security guard at [employer]      11/24/22 patient caring for 1 brother who has Alzheimer and another brother is in a facility   Social Drivers of Health   Financial Resource Strain: Low Risk  (07/29/2023)   Overall Financial Resource Strain (CARDIA)    Difficulty of Paying Living Expenses: Not hard at all  Food Insecurity: No Food Insecurity (07/29/2023)   Hunger Vital Sign    Worried About Running Out of Food in the Last Year: Never true    Ran Out of Food in the Last Year: Never true  Transportation Needs: No Transportation Needs (07/29/2023)   PRAPARE - Administrator, Civil Service (Medical): No    Lack of Transportation (Non-Medical): No  Physical Activity: Sufficiently Active (07/29/2023)   Exercise Vital Sign    Days of Exercise per Week: 7 days    Minutes of Exercise per Session: 50 min  Stress: No Stress Concern Present (07/29/2023)   [employer] of Occupational Health - Occupational Stress Questionnaire    Feeling of Stress : Only a little  Recent Concern: Stress - Stress Concern Present (06/16/2023)   [employer] of Occupational Health - Occupational Stress Questionnaire    Feeling of Stress : To some extent  Social Connections: Moderately Integrated (07/29/2023)   Social Connection and Isolation Panel [NHANES]    Frequency of Communication with Friends and Family: More than three times a week    Frequency of Social Gatherings with Friends and Family: More than three times a week    Attends Religious Services: More than 4 times per year    Active Member of [employer] or Organizations: Yes    Attends Banker Meetings: More than 4 times per year    Marital Status: Widowed  Intimate Partner Violence: Not At Risk (07/29/2023)   Humiliation, Afraid, Rape, and Kick  questionnaire    Fear of Current or Ex-Partner: No    Emotionally Abused: No    Physically Abused: No    Sexually Abused: No    FAMILY HISTORY:  No family history on file.  CURRENT MEDICATIONS:  Outpatient Encounter Medications as of 10/27/2023  Medication Sig   cholecalciferol  (VITAMIN D3) 25 MCG (1000 UNIT) tablet Take 2,000 Units by mouth daily.   feeding supplement, GLUCERNA SHAKE, (GLUCERNA SHAKE) LIQD Take 237  mLs by mouth 3 (three) times daily between meals.   Iron , Ferrous Sulfate , 325 (65 Fe) MG TABS Take 325 mg by mouth daily.   metFORMIN  (GLUCOPHAGE ) 500 MG tablet Take 1 tablet (500 mg total) by mouth 2 (two) times daily with a meal.   simvastatin  (ZOCOR ) 40 MG tablet Take 1 tablet (40 mg total) by mouth daily.   [DISCONTINUED] aspirin EC 81 MG tablet Take 81 mg by mouth daily.   [DISCONTINUED] Calcium  Carb-Cholecalciferol  (CALTRATE 600+D3) 600-20 MG-MCG TABS Take 600 mg by mouth 2 (two) times daily.   [DISCONTINUED] olmesartan  (BENICAR ) 20 MG tablet Take 1 tablet (20 mg total) by mouth daily.   [DISCONTINUED] sulfamethoxazole -trimethoprim  (BACTRIM  DS) 800-160 MG tablet Take 1 tablet by mouth every 12 (twelve) hours.   No facility-administered encounter medications on file as of 10/27/2023.    ALLERGIES:  Allergies  Allergen Reactions   Penicillins     LABORATORY DATA:  I have reviewed the labs as listed.  CBC    Component Value Date/Time   WBC 9.1 10/20/2023 1002   RBC 4.27 10/20/2023 1002   HGB 14.2 10/20/2023 1002   HGB 13.2 02/12/2023 0833   HCT 42.0 10/20/2023 1002   HCT 40.2 02/12/2023 0833   PLT 240 10/20/2023 1002   PLT 119 (L) 02/12/2023 0833   MCV 98.4 10/20/2023 1002   MCV 97 02/12/2023 0833   MCH 33.3 10/20/2023 1002   MCHC 33.8 10/20/2023 1002   RDW 12.9 10/20/2023 1002   RDW 14.5 02/12/2023 0833   LYMPHSABS 1.1 10/20/2023 1002   LYMPHSABS 1.0 02/12/2023 0833   MONOABS 0.6 10/20/2023 1002   EOSABS 0.1 10/20/2023 1002   EOSABS 0.1 02/12/2023  0833   BASOSABS 0.0 10/20/2023 1002   BASOSABS 0.0 02/12/2023 0833      Latest Ref Rng & Units 10/20/2023   10:02 AM 04/14/2023   10:01 AM 02/12/2023    8:33 AM  CMP  Glucose 70 - 99 mg/dL 782  956  213   BUN 8 - 23 mg/dL 25  16  15    Creatinine 0.44 - 1.00 mg/dL 0.86  5.78  4.69   Sodium 135 - 145 mmol/L 135  136  139   Potassium 3.5 - 5.1 mmol/L 3.7  4.1  4.1   Chloride 98 - 111 mmol/L 101  101  102   CO2 22 - 32 mmol/L 23  27  22    Calcium  8.9 - 10.3 mg/dL 9.5  9.4  9.8   Total Protein 6.5 - 8.1 g/dL 7.5  7.2  6.4   Total Bilirubin 0.0 - 1.2 mg/dL 0.7  0.6  0.6   Alkaline Phos 38 - 126 U/L 53  51  55   AST 15 - 41 U/L 18  22  19    ALT 0 - 44 U/L 16  21  13      DIAGNOSTIC IMAGING:  I have independently reviewed the relevant imaging and discussed with the patient.   WRAP UP:  All questions were answered. The patient knows to call the clinic with any problems, questions or concerns.  Medical decision making: Moderate  Time spent on visit: I spent 20 minutes counseling the patient face to face. The total time spent in the appointment was 30 minutes and more than 50% was on counseling.  Sonnie Dusky, PA-C  10/27/23 1:58 PM

## 2023-10-27 ENCOUNTER — Inpatient Hospital Stay: Payer: Medicare Other | Attending: Physician Assistant | Admitting: Physician Assistant

## 2023-10-27 VITALS — BP 123/54 | HR 78 | Temp 97.8°F | Resp 16 | Wt 139.0 lb

## 2023-10-27 DIAGNOSIS — D509 Iron deficiency anemia, unspecified: Secondary | ICD-10-CM | POA: Diagnosis not present

## 2023-10-27 DIAGNOSIS — D472 Monoclonal gammopathy: Secondary | ICD-10-CM | POA: Diagnosis not present

## 2023-10-27 DIAGNOSIS — D508 Other iron deficiency anemias: Secondary | ICD-10-CM | POA: Diagnosis not present

## 2023-10-27 DIAGNOSIS — R768 Other specified abnormal immunological findings in serum: Secondary | ICD-10-CM | POA: Diagnosis not present

## 2023-10-27 NOTE — Patient Instructions (Addendum)
 [facility] **VISIT SUMMARY & IMPORTANT INSTRUCTIONS **   You were seen today by Sheril Dines PA-C for your follow up visit.    ANEMIA: Your blood levels are normal, but your iron  levels are decreasing. We will schedule you for IV iron  x 1 dose. You should continue to take your iron  pill every day.    MGUS ("Monoclonal Gammopathy of Undetermined Significance"): As we discussed, this is a precancerous condition in which you have increased immunoglobulin proteins in your blood.  This can progress to a type of cancer called multiple myeloma.  You do NOT have multiple myeloma cancer at this time.  Prior to your follow-up visit in 6 months we will check the following: Repeat labs 24-hour urine study (kit and instructions have been provided) Whole-body x-rays  FOLLOW-UP APPOINTMENT: Lab send office visit in 6 months  ** Thank you for trusting me with your healthcare!  I strive to provide all of my patients with quality care at each visit.  If you receive a survey for this visit, I would be so grateful to you for taking the time to provide feedback.  Thank you in advance!  ~ Analeese Andreatta                   Dr. Paulett Boros   &   Sheril Dines, PA-C   - - - - - - - - - - - - - - - - - -    Thank you for choosing [facility] to provide your oncology and hematology care.  To afford each patient quality time with our provider, please arrive at least 15 minutes before your scheduled appointment time.   If you have a lab appointment with the Cancer Center please come in thru the Main Entrance and check in at the main information desk.  You need to re-schedule your appointment should you arrive 10 or more minutes late.  We strive to give you quality time with our providers, and arriving late affects you and other patients whose appointments are after yours.  Also, if you no show three or more times for appointments you  may be dismissed from the clinic at the providers discretion.     Again, thank you for choosing [facility].  Our hope is that these requests will decrease the amount of time that you wait before being seen by our physicians.       _____________________________________________________________  Should you have questions after your visit to [facility], please contact our office at [phone] and follow the prompts.  Our office hours are 8:00 a.m. and 4:30 p.m. Monday - Friday.  Please note that voicemails left after 4:00 p.m. may not be returned until the following business day.  We are closed weekends and major holidays.  You do have access to a nurse 24-7, just call the main number to the clinic [phone] and do not press any options, hold on the line and a nurse will answer the phone.    For prescription refill requests, have your pharmacy contact our office and allow 72 hours.

## 2023-11-02 ENCOUNTER — Inpatient Hospital Stay

## 2023-11-02 VITALS — BP 122/49 | HR 76 | Temp 97.6°F | Resp 18

## 2023-11-02 DIAGNOSIS — D472 Monoclonal gammopathy: Secondary | ICD-10-CM | POA: Diagnosis not present

## 2023-11-02 DIAGNOSIS — D508 Other iron deficiency anemias: Secondary | ICD-10-CM

## 2023-11-02 DIAGNOSIS — D509 Iron deficiency anemia, unspecified: Secondary | ICD-10-CM | POA: Diagnosis not present

## 2023-11-02 MED ORDER — SODIUM CHLORIDE 0.9 % IV SOLN
510.0000 mg | Freq: Once | INTRAVENOUS | Status: AC
  Administered 2023-11-02: 510 mg via INTRAVENOUS
  Filled 2023-11-02: qty 510

## 2023-11-02 MED ORDER — SODIUM CHLORIDE 0.9 % IV SOLN: Freq: Once | INTRAVENOUS | Status: AC

## 2023-11-02 NOTE — Progress Notes (Signed)
 Patient presents today for [facility] infusion. Vital signs stable. Patient denies any side effects related to last iron  infusion. MAR reviewed and updated.   Feraheme given today per MD orders. Tolerated infusion without adverse affects. Vital signs stable. No complaints at this time. Discharged from clinic ambulatory in stable condition. Alert and oriented x 3. F/U with [facility] as scheduled.

## 2023-11-02 NOTE — Patient Instructions (Signed)
 [facility] [place] - A DEPT OF [facility]  Discharge Instructions: Thank you for choosing [facility] to provide your oncology and hematology care.  If you have a lab appointment with the Cancer Center - please note that after April 8th, 2024, all labs will be drawn in the cancer center.  You do not have to check in or register with the main entrance as you have in the past but will complete your check-in in the cancer center.  Wear comfortable clothing and clothing appropriate for easy access to any Portacath or PICC line.   We strive to give you quality time with your provider. You may need to reschedule your appointment if you arrive late (15 or more minutes).  Arriving late affects you and other patients whose appointments are after yours.  Also, if you miss three or more appointments without notifying the office, you may be dismissed from the clinic at the provider's discretion.      For prescription refill requests, have your pharmacy contact our office and allow 72 hours for refills to be completed.    Today you received the following chemotherapy and/or immunotherapy agents Feraheme. Ferumoxytol Injection What is this medication? FERUMOXYTOL (FER ue MOX i tol) treats low levels of iron in your body (iron deficiency anemia). Iron is a mineral that plays an important role in making red blood cells, which carry oxygen from your lungs to the rest of your body. This medicine may be used for other purposes; ask your health care provider or pharmacist if you have questions. COMMON BRAND NAME(S): Feraheme What should I tell my care team before I take this medication? They need to know if you have any of these conditions: Anemia not caused by low iron levels High levels of iron in the blood Magnetic resonance imaging (MRI) test scheduled An unusual or allergic reaction to iron, other medications, foods, dyes, or preservatives Pregnant or trying to get  pregnant Breastfeeding How should I use this medication? This medication is injected into a vein. It is given by your care team in a hospital or clinic setting. Talk to your care team the use of this medication in children. Special care may be needed. Overdosage: If you think you have taken too much of this medicine contact a poison control center or emergency room at once. NOTE: This medicine is only for you. Do not share this medicine with others. What if I miss a dose? It is important not to miss your dose. Call your care team if you are unable to keep an appointment. What may interact with this medication? Other iron products This list may not describe all possible interactions. Give your health care provider a list of all the medicines, herbs, non-prescription drugs, or dietary supplements you use. Also tell them if you smoke, drink alcohol, or use illegal drugs. Some items may interact with your medicine. What should I watch for while using this medication? Visit your care team for regular checks on your progress. Tell your care team if your symptoms do not start to get better or if they get worse. You may need blood work done while you are taking this medication. You may need to eat more foods that contain iron. Talk to your care team. Foods that contain iron include whole grains or cereals, dried fruits, beans, peas, leafy green vegetables, and organ meats (liver, kidney). What side effects may I notice from receiving this medication? Side effects that  you should report to your care team as soon as possible: Allergic reactions--skin rash, itching, hives, swelling of the face, lips, tongue, or throat Low blood pressure--dizziness, feeling faint or lightheaded, blurry vision Shortness of breath Side effects that usually do not require medical attention (report to your care team if they continue or are bothersome): Flushing Headache Joint pain Muscle pain Nausea Pain, redness, or  irritation at injection site This list may not describe all possible side effects. Call your doctor for medical advice about side effects. You may report side effects to FDA at 1-800-FDA-1088. Where should I keep my medication? This medication is given in a hospital or clinic. It will not be stored at home. NOTE: This sheet is a summary. It may not cover all possible information. If you have questions about this medicine, talk to your doctor, pharmacist, or health care provider.  2024 Elsevier/Gold Standard (2022-12-31 00:00:00)      To help prevent nausea and vomiting after your treatment, we encourage you to take your nausea medication as directed.  BELOW ARE SYMPTOMS THAT SHOULD BE REPORTED IMMEDIATELY: *FEVER GREATER THAN 100.4 F (38 C) OR HIGHER *CHILLS OR SWEATING *NAUSEA AND VOMITING THAT IS NOT CONTROLLED WITH YOUR NAUSEA MEDICATION *UNUSUAL SHORTNESS OF BREATH *UNUSUAL BRUISING OR BLEEDING *URINARY PROBLEMS (pain or burning when urinating, or frequent urination) *BOWEL PROBLEMS (unusual diarrhea, constipation, pain near the anus) TENDERNESS IN MOUTH AND THROAT WITH OR WITHOUT PRESENCE OF ULCERS (sore throat, sores in mouth, or a toothache) UNUSUAL RASH, SWELLING OR PAIN  UNUSUAL VAGINAL DISCHARGE OR ITCHING   Items with * indicate a potential emergency and should be followed up as soon as possible or go to the Emergency Department if any problems should occur.  Please show the CHEMOTHERAPY ALERT CARD or IMMUNOTHERAPY ALERT CARD at check-in to the Emergency Department and triage nurse.  Should you have questions after your visit or need to cancel or reschedule your appointment, please contact [facility] [place] - A DEPT OF Eligha Bridegroom [facility] [phone]  and follow the prompts.  Office hours are 8:00 a.m. to 4:30 p.m. Monday - Friday. Please note that voicemails left after 4:00 p.m. may not be returned until the following business day.  We are closed weekends  and major holidays. You have access to a nurse at all times for urgent questions. Please call the main number to the clinic [phone] and follow the prompts.  For any non-urgent questions, you may also contact your provider using MyChart. We now offer e-Visits for anyone 67 and older to request care online for non-urgent symptoms. For details visit [email or web address].   Also download the MyChart app! Go to the app store, search "MyChart", open the app, select [place], and log in with your MyChart username and password.

## 2023-11-16 ENCOUNTER — Telehealth: Payer: Self-pay

## 2023-11-16 DIAGNOSIS — E119 Type 2 diabetes mellitus without complications: Secondary | ICD-10-CM | POA: Diagnosis not present

## 2023-11-16 NOTE — Telephone Encounter (Signed)
 Copied from CRM [id number]. Topic: Appointments - Appointment Scheduling >> Nov 16, 2023  9:24 AM Donna BRAVO wrote: Patient/patient representative is calling to schedule an appointment. Refer to attachments for appointment information.  Patient calling to schedule appt physical Patient would like labs done before appt  Please call patient to schedule lab appt

## 2023-11-23 ENCOUNTER — Other Ambulatory Visit: Payer: Self-pay | Admitting: Family Medicine

## 2023-11-23 DIAGNOSIS — E7849 Other hyperlipidemia: Secondary | ICD-10-CM

## 2023-11-23 DIAGNOSIS — R7301 Impaired fasting glucose: Secondary | ICD-10-CM

## 2023-11-23 DIAGNOSIS — E559 Vitamin D deficiency, unspecified: Secondary | ICD-10-CM

## 2023-11-23 DIAGNOSIS — E038 Other specified hypothyroidism: Secondary | ICD-10-CM

## 2023-11-24 NOTE — Telephone Encounter (Signed)
No answer on home phone.

## 2023-12-23 ENCOUNTER — Other Ambulatory Visit: Payer: Self-pay | Admitting: Family Medicine

## 2023-12-23 DIAGNOSIS — E7849 Other hyperlipidemia: Secondary | ICD-10-CM

## 2023-12-23 DIAGNOSIS — E1165 Type 2 diabetes mellitus with hyperglycemia: Secondary | ICD-10-CM

## 2023-12-23 DIAGNOSIS — E119 Type 2 diabetes mellitus without complications: Secondary | ICD-10-CM

## 2023-12-23 MED ORDER — SIMVASTATIN 40 MG PO TABS: 40.0000 mg | ORAL_TABLET | Freq: Every day | ORAL | 2 refills | Status: DC

## 2023-12-23 MED ORDER — METFORMIN HCL 500 MG PO TABS: 500.0000 mg | ORAL_TABLET | Freq: Two times a day (BID) | ORAL | 0 refills | Status: DC

## 2023-12-23 NOTE — Telephone Encounter (Signed)
 Copied from CRM [id number]. Topic: Clinical - Medication Refill >> Dec 23, 2023  9:56 AM Zebedee SAUNDERS wrote: Medication: metFORMIN  (GLUCOPHAGE ) 500 MG tablet, simvastatin  (ZOCOR ) 40 MG tablet,   Has the patient contacted their pharmacy? Yes (Agent: If no, request that the patient contact the pharmacy for the refill. If patient does not wish to contact the pharmacy document the reason why and proceed with request.) (Agent: If yes, when and what did the pharmacy advise?)  This is the patient's preferred pharmacy:  [facility] [place], [place] - [id number] [place] [address] Phone: [phone] Fax: [phone]  Is this the correct pharmacy for this prescription? Yes If no, delete pharmacy and type the correct one.   Has the prescription been filled recently? Yes  Is the patient out of the medication? Yes  Has the patient been seen for an appointment in the last year OR does the patient have an upcoming appointment? Yes  Can we respond through MyChart? Yes  Agent: Please be advised that Rx refills may take up to 3 business days. We ask that you follow-up with your pharmacy.

## 2024-02-11 ENCOUNTER — Ambulatory Visit (INDEPENDENT_AMBULATORY_CARE_PROVIDER_SITE_OTHER): Payer: Self-pay | Admitting: Family Medicine

## 2024-02-11 ENCOUNTER — Encounter: Payer: Self-pay | Admitting: Family Medicine

## 2024-02-11 ENCOUNTER — Encounter: Payer: Self-pay | Admitting: Oncology

## 2024-02-11 VITALS — BP 137/69 | HR 87 | Resp 16 | Ht 64.5 in | Wt 138.0 lb

## 2024-02-11 DIAGNOSIS — Z0001 Encounter for general adult medical examination with abnormal findings: Secondary | ICD-10-CM | POA: Diagnosis not present

## 2024-02-11 DIAGNOSIS — E7849 Other hyperlipidemia: Secondary | ICD-10-CM

## 2024-02-11 DIAGNOSIS — E1165 Type 2 diabetes mellitus with hyperglycemia: Secondary | ICD-10-CM

## 2024-02-11 DIAGNOSIS — E559 Vitamin D deficiency, unspecified: Secondary | ICD-10-CM

## 2024-02-11 DIAGNOSIS — E119 Type 2 diabetes mellitus without complications: Secondary | ICD-10-CM

## 2024-02-11 DIAGNOSIS — Z9181 History of falling: Secondary | ICD-10-CM | POA: Diagnosis not present

## 2024-02-11 DIAGNOSIS — R42 Dizziness and giddiness: Secondary | ICD-10-CM

## 2024-02-11 DIAGNOSIS — E038 Other specified hypothyroidism: Secondary | ICD-10-CM

## 2024-02-11 MED ORDER — METFORMIN HCL 500 MG PO TABS: 500.0000 mg | ORAL_TABLET | Freq: Two times a day (BID) | ORAL | 1 refills | Status: DC

## 2024-02-11 NOTE — Patient Instructions (Addendum)
 I appreciate the opportunity to provide care to you today!    Follow up:  4 months  Labs: please stop by the lab during the week to get your blood drawn (CBC, CMP, TSH, Lipid profile, HgA1c, Vit D)  Dizziness lifestyle / Non-Pharmacological Measures -Rise slowly from sitting/lying positions. -Avoid driving or operating heavy machinery until dizziness improves. -Encourage adequate sleep and balanced diet. -Encourage hydration and regular meals to prevent lightheadedness from dehydration or low blood sugar.   Monitoring & Red Flags -Educated patient to return immediately if she experiences: -Severe or worsening dizziness -New headache, weakness, numbness, or slurred speech -Chest pain, palpitations, or fainting -Persistent nausea/vomiting preventing fluid intake  Please follow up if your symptoms worsen or fail to improve.   Referrals today- Case management  Attached with your AVS, you will find valuable resources for self-education. I highly recommend dedicating some time to thoroughly examine them.   Please continue to a heart-healthy diet and increase your physical activities. Try to exercise for at least five days a week.    It was a pleasure to see you and I look forward to continuing to work together on your health and well-being. Please do not hesitate to call the office if you need care or have questions about your care.  In case of emergency, please visit the Emergency Department for urgent care, or contact our clinic at [phone] to schedule an appointment. We're here to help you!   Have a wonderful day and week. With Gratitude, Maureena Dabbs MSN, FNP-BC

## 2024-02-11 NOTE — Progress Notes (Unsigned)
 Complete physical exam  Patient: Gloria Owens   DOB: 21-Sep-1935   87 y.o. Female  MRN: [id number]  Subjective:    Chief Complaint  Patient presents with   Annual Exam    cpe   Dizziness    Complains of dizziness since Monday. Along with nausea. Denies vomiting, diarrhea, fever, or ear fullness/pain      Gloria Owens is a 88 y.o. female  patient presents with complaints of dizziness since Monday, accompanied by nausea. She denies vomiting, diarrhea, fever, tinnitus, or ear pain/fullness. She describes the dizziness as more of a "woozy" sensation in her head, which is most noticeable with certain head movements.  She reports that symptoms were most severe on Monday, 02/08/2024, but have since improved. She has a known history of iron  deficiency anemia and last received an iron  infusion in June 2025. She denies generalized weakness.      Most recent fall risk assessment:    07/29/2023   11:38 AM  Fall Risk   Falls in the past year? 1  Number falls in past yr: 0  Injury with Fall? 1  Risk for fall due to : History of fall(s);Impaired vision;Impaired balance/gait;Impaired mobility  Follow up Falls evaluation completed;Follow up appointment;Education provided;Falls prevention discussed     Most recent depression screenings:    07/29/2023   11:42 AM 06/16/2023    2:42 PM  PHQ 2/9 Scores  PHQ - 2 Score 0 4  PHQ- 9 Score 0 7    Dental: No current dental problems and No regular dental care  and STD: The patient denies history of sexually transmitted disease.  Patient Active Problem List   Diagnosis Date Noted   Risk for falls 02/12/2024   Dizziness 02/12/2024   Urinary incontinence 02/11/2023   Left lower quadrant abdominal pain 11/11/2022   Iron  deficiency anemia 11/04/2022   Sleep disturbance 06/28/2022   Recurrent cystitis 04/22/2022   Encounter for general adult medical examination with abnormal findings 03/21/2022   SUI (stress urinary incontinence, female)  06/13/2021   Constipation 05/13/2021   Urine output low 05/13/2021   Loss of appetite for more than 2 weeks 05/13/2021   Leg cramping 04/22/2021   Back pain 10/16/2020   Unspecified open wound, right lower leg, subsequent encounter 07/31/2020   Encounter to establish care 07/12/2020   Screening due 07/12/2020   Hypertension, essential 07/12/2020   Uncontrolled type 2 diabetes mellitus with hyperglycemia (HCC) 07/12/2020   Hyperlipidemia 07/12/2020   Vitamin D  deficiency 07/12/2020   Past Medical History:  Diagnosis Date   Hypercholesteremia    Seizures (HCC)    with eye surgery several years ago-unknown etiology-no meds   Past Surgical History:  Procedure Laterality Date   ABDOMINAL HYSTERECTOMY     total hysterectomy   BACK SURGERY     fusion of back x2 and 1 other back surgery   CHOLECYSTECTOMY     COLON RESECTION     from endometriosis   CYSTOSCOPY     fulgeration of tumors   HERNIA REPAIR Right    INGUINAL HERNIA REPAIR Left 06/20/2013   Procedure: HERNIA REPAIR INGUINAL ADULT;  Surgeon: Oneil DELENA Budge, MD;  Location: [facility];  Service: General;  Laterality: Left;   INSERTION OF MESH Left 06/20/2013   Procedure: INSERTION OF MESH;  Surgeon: Oneil DELENA Budge, MD;  Location: [facility];  Service: General;  Laterality: Left;   Social History   Tobacco Use   Smoking status: Never  Passive exposure: Never   Smokeless tobacco: Never  Vaping Use   Vaping status: Never Used  Substance Use Topics   Alcohol use: No   Drug use: No   Social History   Socioeconomic History   Marital status: Widowed    Spouse name: Not on file   Number of children: 0   Years of education: 41   Highest education level: 12th grade  Occupational History   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  Tobacco Use   Smoking status: Never    Passive exposure: Never   Smokeless tobacco: Never  Vaping Use   Vaping status: Never Used  Substance and Sexual Activity   Alcohol  use: No   Drug use: No   Sexual activity: Not Currently    Partners: Male    Birth control/protection: Surgical  Other Topics Concern   Not on file  Social History Narrative   No children- had endometriosis an dhysterectomy; husband recently deceased   Nephew is Tashaya Ancrum- security guard at [employer]      11/24/22 patient caring for 1 brother who has Alzheimer and another brother is in a facility   Social Drivers of Health   Financial Resource Strain: Low Risk  (07/29/2023)   Overall Financial Resource Strain (CARDIA)    Difficulty of Paying Living Expenses: Not hard at all  Food Insecurity: No Food Insecurity (07/29/2023)   Hunger Vital Sign    Worried About Running Out of Food in the Last Year: Never true    Ran Out of Food in the Last Year: Never true  Transportation Needs: No Transportation Needs (07/29/2023)   PRAPARE - Administrator, Civil Service (Medical): No    Lack of Transportation (Non-Medical): No  Physical Activity: Sufficiently Active (07/29/2023)   Exercise Vital Sign    Days of Exercise per Week: 7 days    Minutes of Exercise per Session: 50 min  Stress: No Stress Concern Present (07/29/2023)   [employer] of Occupational Health - Occupational Stress Questionnaire    Feeling of Stress : Only a little  Recent Concern: Stress - Stress Concern Present (06/16/2023)   [employer] of Occupational Health - Occupational Stress Questionnaire    Feeling of Stress : To some extent  Social Connections: Moderately Integrated (07/29/2023)   Social Connection and Isolation Panel    Frequency of Communication with Friends and Family: More than three times a week    Frequency of Social Gatherings with Friends and Family: More than three times a week    Attends Religious Services: More than 4 times per year    Active Member of [employer] or Organizations: Yes    Attends Banker Meetings: More than 4 times per year    Marital Status: Widowed  Intimate Partner  Violence: Not At Risk (07/29/2023)   Humiliation, Afraid, Rape, and Kick questionnaire    Fear of Current or Ex-Partner: No    Emotionally Abused: No    Physically Abused: No    Sexually Abused: No   No family status information on file.   History reviewed. No pertinent family history. Allergies  Allergen Reactions   Penicillins       Patient Care Team: Milanya Sunderland, FNP as PCP - General (Family Medicine) Ramonita Suzen CROME, RN as [facility] Management Colona, Sallis, DO (Optometry) McKenzie, Belvie CROME, MD as Consulting Physician (Urology)   Outpatient Medications Prior to Visit  Medication Sig  cholecalciferol  (VITAMIN D3) 25 MCG (1000 UNIT) tablet Take 2,000 Units by mouth daily.   feeding supplement, GLUCERNA SHAKE, (GLUCERNA SHAKE) LIQD Take 237 mLs by mouth 3 (three) times daily between meals.   Iron , Ferrous Sulfate , 325 (65 Fe) MG TABS Take 325 mg by mouth daily.   simvastatin  (ZOCOR ) 40 MG tablet Take 1 tablet (40 mg total) by mouth daily.   [DISCONTINUED] metFORMIN  (GLUCOPHAGE ) 500 MG tablet Take 1 tablet (500 mg total) by mouth 2 (two) times daily with a meal.   No facility-administered medications prior to visit.    Review of Systems  Constitutional:  Negative for chills, fever and malaise/fatigue.  HENT:  Negative for congestion and sinus pain.   Eyes:  Negative for pain, discharge and redness.  Respiratory:  Negative for cough, sputum production and shortness of breath.   Cardiovascular:  Negative for chest pain, palpitations, claudication and leg swelling.  Gastrointestinal:  Negative for diarrhea, heartburn and nausea.  Genitourinary:  Negative for flank pain and frequency.  Musculoskeletal:  Negative for back pain and joint pain.  Skin:  Negative for itching.  Neurological:  Positive for dizziness. Negative for seizures and headaches.  Endo/Heme/Allergies:  Negative for environmental allergies.  Psychiatric/Behavioral:  Negative for  memory loss. The patient does not have insomnia.        Objective:    BP 137/69   Pulse 87   Resp 16   Ht 5' 4.5 (1.638 m)   Wt 138 lb (62.6 kg)   SpO2 94%   BMI 23.32 kg/m  BP Readings from Last 3 Encounters:  02/11/24 137/69  11/02/23 (!) 122/49  10/27/23 (!) 123/54   Wt Readings from Last 3 Encounters:  02/11/24 138 lb (62.6 kg)  10/27/23 139 lb (63 kg)  02/11/23 142 lb 0.6 oz (64.4 kg)      Physical Exam HENT:     Head: Normocephalic.     Left Ear: External ear normal.     Mouth/Throat:     Mouth: Mucous membranes are moist.  Eyes:     Extraocular Movements: Extraocular movements intact.     Pupils: Pupils are equal, round, and reactive to light.  Cardiovascular:     Rate and Rhythm: Normal rate and regular rhythm.     Heart sounds: No murmur heard. Pulmonary:     Effort: Pulmonary effort is normal.     Breath sounds: Normal breath sounds.  Abdominal:     Palpations: Abdomen is soft.     Tenderness: There is no right CVA tenderness or left CVA tenderness.  Musculoskeletal:     Right lower leg: No edema.     Left lower leg: No edema.  Skin:    Findings: No lesion or rash.  Neurological:     Mental Status: She is alert and oriented to person, place, and time.     GCS: GCS eye subscore is 4. GCS verbal subscore is 5. GCS motor subscore is 6.     Cranial Nerves: No facial asymmetry.     Sensory: No sensory deficit.     Motor: No weakness or pronator drift.     Coordination: Romberg sign positive. Coordination normal. Finger-Nose-Finger Test normal.     Gait: Gait abnormal.     Deep Tendon Reflexes:     Reflex Scores:      Brachioradialis reflexes are 3+ on the right side. Psychiatric:        Judgment: Judgment normal.     No results found for  any visits on 02/11/24. Last CBC Lab Results  Component Value Date   WBC 9.1 10/20/2023   HGB 14.2 10/20/2023   HCT 42.0 10/20/2023   MCV 98.4 10/20/2023   MCH 33.3 10/20/2023   RDW 12.9 10/20/2023    PLT 240 10/20/2023   Last metabolic panel Lab Results  Component Value Date   GLUCOSE 150 (H) 10/20/2023   NA 135 10/20/2023   K 3.7 10/20/2023   CL 101 10/20/2023   CO2 23 10/20/2023   BUN 25 (H) 10/20/2023   CREATININE 0.75 10/20/2023   GFRNONAA >60 10/20/2023   CALCIUM  9.5 10/20/2023   PROT 7.5 10/20/2023   ALBUMIN 4.1 10/20/2023   LABGLOB 3.1 10/20/2023   AGRATIO 1.2 10/20/2023   BILITOT 0.7 10/20/2023   ALKPHOS 53 10/20/2023   AST 18 10/20/2023   ALT 16 10/20/2023   ANIONGAP 11 10/20/2023   Last lipids Lab Results  Component Value Date   CHOL 141 02/12/2023   HDL 56 02/12/2023   LDLCALC 66 02/12/2023   TRIG 103 02/12/2023   CHOLHDL 2.5 02/12/2023   Last hemoglobin A1c Lab Results  Component Value Date   HGBA1C 6.5 (H) 02/12/2023   Last thyroid  functions Lab Results  Component Value Date   TSH 1.900 02/12/2023   Last vitamin D  Lab Results  Component Value Date   VD25OH 54.9 02/12/2023   Last vitamin B12 and Folate Lab Results  Component Value Date   VITAMINB12 789 12/18/2022   FOLATE 30.0 12/18/2022        Assessment & Plan:    Routine Health Maintenance and Physical Exam  Immunization History  Administered Date(s) Administered   Moderna Sars-Covid-2 Vaccination 07/29/2019, 08/30/2019    Health Maintenance  Topic Date Due   Medicare Annual Wellness (AWV)  11/19/2022   HEMOGLOBIN A1C  08/12/2023   OPHTHALMOLOGY EXAM  11/13/2023   Zoster Vaccines- Shingrix (1 of 2) 05/12/2024 (Originally 10/30/1954)   Influenza Vaccine  08/23/2024 (Originally 12/25/2023)   DTaP/Tdap/Td (1 - Tdap) 02/10/2025 (Originally 10/30/1954)   FOOT EXAM  02/10/2025   DEXA SCAN  Completed   HPV VACCINES  Aged Out   Meningococcal B Vaccine  Aged Out   Pneumococcal Vaccine: 50+ Years  Discontinued   COVID-19 Vaccine  Discontinued    Discussed health benefits of physical activity, and encouraged her to engage in regular exercise appropriate for her age and  condition.  Encounter for general adult medical examination with abnormal findings Assessment & Plan: Physical exam as documented Discussed heart-healthy diet  Encouraged to Exercise: If you are able: 30 -60 minutes a day ,4 days a week, or 150 minutes a week. The longer the better. Combine stretch, strength, and aerobic activities Encourage to eat whole Food, Plant Predominant Nutrition is highly recommended: Eat Plenty of vegetables, Mushrooms, fruits, Legumes, Whole Grains, Nuts, seeds in lieu of processed meats, processed snacks/pastries red meat, poultry, eggs.  Will f/u in 1 year for CPE      Risk for falls Assessment & Plan: The patient lives alone and is unsteady with her gait and balance. She uses a walker and cane for ambulation but does not have a life alert bracelet, although she has attempted to purchase one over-the-counter. A referral will be placed to case management for assistance. Safety measures were discussed, such as fall prevention strategies and the importance of having an emergency alert system in place.   Orders: -     AMB Referral [facility] Care Management  Dizziness Assessment &  Plan: Pending labs Discussed lifestyle / Non-Pharmacological Measures -Rise slowly from sitting/lying positions. -Avoid driving or operating heavy machinery until dizziness improves. -Encourage adequate sleep and balanced diet. -Encourage hydration and regular meals to prevent lightheadedness from dehydration or low blood sugar.   Discussed Monitoring & Red Flags -Educated patient to return immediately if she experiences: -Severe or worsening dizziness -New headache, weakness, numbness, or slurred speech -Chest pain, palpitations, or fainting -Persistent nausea/vomiting preventing fluid intake   Uncontrolled type 2 diabetes mellitus with hyperglycemia (HCC) -     metFORMIN  HCl; Take 1 tablet (500 mg total) by mouth 2 (two) times daily with a meal.  Dispense: 180 tablet; Refill:  1 -     Hemoglobin A1c -     HM Diabetes Foot Exam  Type 2 diabetes mellitus without complication, without long-term current use of insulin (HCC) -     metFORMIN  HCl; Take 1 tablet (500 mg total) by mouth 2 (two) times daily with a meal.  Dispense: 180 tablet; Refill: 1  Vitamin D  deficiency -     VITAMIN D  25 Hydroxy (Vit-D Deficiency, Fractures)  TSH (thyroid -stimulating hormone deficiency) -     TSH + free T4  Other hyperlipidemia -     Lipid panel -     CMP14+EGFR -     CBC with Differential/Platelet    No follow-ups on file.    Note: This chart has been completed using Engineer, civil (consulting) software, and while attempts have been made to ensure accuracy, certain words and phrases may not be transcribed as intended.    Christianna Belmonte, FNP

## 2024-02-12 DIAGNOSIS — Z9181 History of falling: Secondary | ICD-10-CM | POA: Insufficient documentation

## 2024-02-12 DIAGNOSIS — R42 Dizziness and giddiness: Secondary | ICD-10-CM | POA: Insufficient documentation

## 2024-02-12 NOTE — Assessment & Plan Note (Signed)
 Pending labs Discussed lifestyle / Non-Pharmacological Measures -Rise slowly from sitting/lying positions. -Avoid driving or operating heavy machinery until dizziness improves. -Encourage adequate sleep and balanced diet. -Encourage hydration and regular meals to prevent lightheadedness from dehydration or low blood sugar.   Discussed Monitoring & Red Flags -Educated patient to return immediately if she experiences: -Severe or worsening dizziness -New headache, weakness, numbness, or slurred speech -Chest pain, palpitations, or fainting -Persistent nausea/vomiting preventing fluid intake

## 2024-02-12 NOTE — Assessment & Plan Note (Signed)

## 2024-02-12 NOTE — Assessment & Plan Note (Signed)
 The patient lives alone and is unsteady with her gait and balance. She uses a walker and cane for ambulation but does not have a life alert bracelet, although she has attempted to purchase one over-the-counter. A referral will be placed to case management for assistance. Safety measures were discussed, such as fall prevention strategies and the importance of having an emergency alert system in place.

## 2024-02-15 ENCOUNTER — Other Ambulatory Visit: Payer: Self-pay

## 2024-02-15 DIAGNOSIS — E7849 Other hyperlipidemia: Secondary | ICD-10-CM | POA: Diagnosis not present

## 2024-02-15 DIAGNOSIS — E038 Other specified hypothyroidism: Secondary | ICD-10-CM | POA: Diagnosis not present

## 2024-02-15 DIAGNOSIS — E1165 Type 2 diabetes mellitus with hyperglycemia: Secondary | ICD-10-CM | POA: Diagnosis not present

## 2024-02-15 NOTE — Patient Outreach (Signed)
 Complex Care Management   Visit Note  02/15/2024  Name:  Gloria Owens MRN: [id number] DOB: 05-02-1936  Situation: Referral received for Complex Care Management related to SDOH Barriers:  Life alert system I obtained verbal consent from Patient.  Visit completed with Patient  on the phone  Background:   Past Medical History:  Diagnosis Date   Hypercholesteremia    Seizures (HCC)    with eye surgery several years ago-unknown etiology-no meds    Assessment:  Patient lives alone and has recently fallen. SW t/c [employer] and is directed to [employer] [phone] to request a free life alert system. SW spoke to [employer] and device will be mail. Patient should receive equipment in 1 week. Patient is provided instructions and contact number for support.   SDOH Interventions    Flowsheet Row Patient Outreach Telephone from 02/15/2024 in [facility] [place] [facility] DEPARTMENT Care Coordination from 07/29/2023 in [facility] Community Care Coordination Care Coordination from 06/16/2023 in [facility] Community Care Coordination Care Coordination from 04/16/2023 in [facility] Community Care Coordination Care Coordination from 12/17/2022 in [facility] Care Coordination Clinical Support from 11/24/2022 in [facility]  SDOH Interventions        Food Insecurity Interventions Intervention Not Indicated  [friends and family helps] Intervention Not Indicated Intervention Not Indicated Intervention Not Indicated Intervention Not Indicated Intervention Not Indicated  Housing Interventions Intervention Not Indicated Intervention Not Indicated Intervention Not Indicated Intervention Not Indicated Intervention Not Indicated Intervention Not Indicated  Transportation Interventions Intervention Not Indicated  [Drives] Intervention Not Indicated, Patient Resources (Friends/Family) Intervention Not Indicated, Patient Resources  (Friends/Family) Intervention Not Indicated, Patient Resources (Friends/Family) Intervention Not Indicated, Patient Resources (Friends/Family) Intervention Not Indicated  Utilities Interventions Intervention Not Indicated Intervention Not Indicated Intervention Not Indicated Intervention Not Indicated Intervention Not Indicated Intervention Not Indicated  Alcohol Usage Interventions -- Intervention Not Indicated (Score <7) Intervention Not Indicated (Score <7) Intervention Not Indicated (Score <7) Intervention Not Indicated (Score <7) Intervention Not Indicated (Score <7)  Depression Interventions/Treatment  -- Medication, Counseling, Currently on Treatment Medication, Counseling, Community Resources Provided -- -- --  Financial Strain Interventions Intervention Not Indicated Intervention Not Indicated Intervention Not Indicated Intervention Not Indicated Intervention Not Indicated Intervention Not Indicated  Physical Activity Interventions -- Intervention Not Indicated Intervention Not Indicated Intervention Not Indicated Intervention Not Indicated Intervention Not Indicated  Stress Interventions -- Intervention Not Indicated Community Resources Provided, Offered [employer], Provide Counseling Intervention Not Indicated Intervention Not Indicated Intervention Not Indicated  Social Connections Interventions -- Intervention Not Indicated Intervention Not Indicated, Community Resources Provided Intervention Not Indicated Intervention Not Indicated Intervention Not Indicated  Health Literacy Interventions -- Intervention Not Indicated Intervention Not Indicated Intervention Not Indicated Intervention Not Indicated --      Recommendation:   None  Follow Up Plan:   Telephone follow up appointment date/time:  02/24/24 at 10am  Tillman Gardener, BSW [place]  [facility], [facility] [place] Social Worker Direct Dial: [phone]  Fax: [phone] Website:  [email or web address]

## 2024-02-15 NOTE — Patient Instructions (Signed)
 Visit Information  Thank you for taking time to visit with me today. Please don't hesitate to contact me if I can be of assistance to you before our next scheduled appointment.  Our next appointment is by telephone on 02/24/24 at 10am Please call the care guide team at [phone] if you need to cancel or reschedule your appointment.   Following is a copy of your care plan:   Goals Addressed             This Visit's Progress    BSW VBCI Social Work Care Plan       Problems:   Life Alert system  CSW Clinical Goal(s):   Over the next 7 days the Patient will work with [employer] to address needs related to [employer] system.  Interventions:  Social Determinants of Health in Patient with DMII and HTN: SDOH assessments completed: Life Alert system Evaluation of current treatment plan related to unmet needs Patient lives alone and has recently fallen.  SW t/c [employer] and is directed to [employer] [phone] to request a free life alert system.  SW spoke to [employer] and device will be mail.  Patient should receive equipment in 1 week.  Patient is provided instructions and contact number for support.  Patient Goals/Self-Care Activities:  Patient will await receipt of equipment.   Plan:   Telephone follow up appointment with care management team member scheduled for:  02/24/24 at 10am        Please call 911 if you are experiencing a Mental Health or Behavioral Health Crisis or need someone to talk to.  Patient verbalizes understanding of instructions and care plan provided today and agrees to view in MyChart. Active MyChart status and patient understanding of how to access instructions and care plan via MyChart confirmed with patient.     Tillman Gardener, BSW [place]  [facility], [facility] Social Worker Direct Dial: [phone]  Fax: [phone] Website: [email or web address]

## 2024-02-16 LAB — CBC WITH DIFFERENTIAL/PLATELET
Basophils Absolute: 0 x10E3/uL (ref 0.0–0.2)
Basos: 0 %
EOS (ABSOLUTE): 0.1 x10E3/uL (ref 0.0–0.4)
Eos: 2 %
Hematocrit: 43.1 % (ref 34.0–46.6)
Hemoglobin: 14.2 g/dL (ref 11.1–15.9)
Immature Grans (Abs): 0 x10E3/uL (ref 0.0–0.1)
Immature Granulocytes: 0 %
Lymphocytes Absolute: 1.2 x10E3/uL (ref 0.7–3.1)
Lymphs: 20 %
MCH: 33.6 pg — ABNORMAL HIGH (ref 26.6–33.0)
MCHC: 32.9 g/dL (ref 31.5–35.7)
MCV: 102 fL — ABNORMAL HIGH (ref 79–97)
Monocytes Absolute: 0.4 x10E3/uL (ref 0.1–0.9)
Monocytes: 7 %
Neutrophils Absolute: 4.1 x10E3/uL (ref 1.4–7.0)
Neutrophils: 71 %
Platelets: 134 x10E3/uL — ABNORMAL LOW (ref 150–450)
RBC: 4.23 x10E6/uL (ref 3.77–5.28)
RDW: 12 % (ref 11.7–15.4)
WBC: 5.9 x10E3/uL (ref 3.4–10.8)

## 2024-02-16 LAB — CMP14+EGFR
ALT: 15 IU/L (ref 0–32)
AST: 15 IU/L (ref 0–40)
Albumin: 4.4 g/dL (ref 3.7–4.7)
Alkaline Phosphatase: 67 IU/L (ref 48–129)
BUN/Creatinine Ratio: 18 (ref 12–28)
BUN: 13 mg/dL (ref 8–27)
Bilirubin Total: 0.7 mg/dL (ref 0.0–1.2)
CO2: 24 mmol/L (ref 20–29)
Calcium: 9.8 mg/dL (ref 8.7–10.3)
Chloride: 101 mmol/L (ref 96–106)
Creatinine, Ser: 0.74 mg/dL (ref 0.57–1.00)
Globulin, Total: 2.1 g/dL (ref 1.5–4.5)
Glucose: 179 mg/dL — ABNORMAL HIGH (ref 70–99)
Potassium: 4.2 mmol/L (ref 3.5–5.2)
Sodium: 140 mmol/L (ref 134–144)
Total Protein: 6.5 g/dL (ref 6.0–8.5)
eGFR: 79 mL/min/1.73 (ref >59)

## 2024-02-16 LAB — LIPID PANEL
Chol/HDL Ratio: 3 ratio (ref 0.0–4.4)
Cholesterol, Total: 172 mg/dL (ref 100–199)
HDL: 58 mg/dL (ref >39)
LDL Chol Calc (NIH): 91 mg/dL (ref 0–99)
Triglycerides: 129 mg/dL (ref 0–149)
VLDL Cholesterol Cal: 23 mg/dL (ref 5–40)

## 2024-02-16 LAB — HEMOGLOBIN A1C
Est. average glucose Bld gHb Est-mCnc: 137 mg/dL
Hgb A1c MFr Bld: 6.4 % — ABNORMAL HIGH (ref 4.8–5.6)

## 2024-02-16 LAB — TSH+FREE T4
Free T4: 1.08 ng/dL (ref 0.82–1.77)
TSH: 2.4 u[IU]/mL (ref 0.450–4.500)

## 2024-02-16 LAB — VITAMIN D 25 HYDROXY (VIT D DEFICIENCY, FRACTURES): Vit D, 25-Hydroxy: 42.5 ng/mL (ref 30.0–100.0)

## 2024-02-23 ENCOUNTER — Ambulatory Visit: Payer: Self-pay | Admitting: Family Medicine

## 2024-02-24 ENCOUNTER — Other Ambulatory Visit: Payer: Self-pay

## 2024-02-24 NOTE — Patient Instructions (Signed)
 Visit Information  Thank you for taking time to visit with me today. Please don't hesitate to contact me if I can be of assistance to you before our next scheduled appointment.  Your next care management appointment is by telephone on 03/07/24 at 10:30 am   Please call the care guide team at [phone] if you need to cancel, schedule, or reschedule an appointment.   Please call 911 if you are experiencing a Mental Health or Behavioral Health Crisis or need someone to talk to.  Tillman Gardener, BSW [place]  [facility], [facility] Social Worker Direct Dial: [phone]  Fax: [phone] Website: [email or web address]

## 2024-02-24 NOTE — Patient Outreach (Signed)
 Complex Care Management   Visit Note  02/24/2024  Name:  Gloria Owens MRN: [id number] DOB: 02-08-1936  Situation: Referral received for Complex Care Management related to SDOH Barriers:  Life alert system I obtained verbal consent from Patient.  Visit completed with Patient  on the phone  Background:   Past Medical History:  Diagnosis Date   Hypercholesteremia    Seizures (HCC)    with eye surgery several years ago-unknown etiology-no meds    Assessment:  Patient has not received devices from [place]. SW t/c [employer] [phone] and informed the device was on back order and will be mailed by the end of the week. Patient should receive equipment in 1.5 weeks. Patient plans to obtain a disability sign for parking. Patients niece and nephew help.   SDOH Interventions    Flowsheet Row Patient Outreach Telephone from 02/15/2024 in [place] POPULATION HEALTH DEPARTMENT Care Coordination from 07/29/2023 in [facility] Community Care Coordination Care Coordination from 06/16/2023 in [facility] Community Care Coordination Care Coordination from 04/16/2023 in [facility] Community Care Coordination Care Coordination from 12/17/2022 in [facility] Care Coordination Clinical Support from 11/24/2022 in [facility] [place] [facility]  SDOH Interventions        Food Insecurity Interventions Intervention Not Indicated  [friends and family helps] Intervention Not Indicated Intervention Not Indicated Intervention Not Indicated Intervention Not Indicated Intervention Not Indicated  Housing Interventions Intervention Not Indicated Intervention Not Indicated Intervention Not Indicated Intervention Not Indicated Intervention Not Indicated Intervention Not Indicated  Transportation Interventions Intervention Not Indicated  [Drives] Intervention Not Indicated, Patient Resources (Friends/Family) Intervention Not Indicated, Patient  Resources (Friends/Family) Intervention Not Indicated, Patient Resources (Friends/Family) Intervention Not Indicated, Patient Resources (Friends/Family) Intervention Not Indicated  Utilities Interventions Intervention Not Indicated Intervention Not Indicated Intervention Not Indicated Intervention Not Indicated Intervention Not Indicated Intervention Not Indicated  Alcohol Usage Interventions -- Intervention Not Indicated (Score <7) Intervention Not Indicated (Score <7) Intervention Not Indicated (Score <7) Intervention Not Indicated (Score <7) Intervention Not Indicated (Score <7)  Depression Interventions/Treatment  -- Medication, Counseling, Currently on Treatment Medication, Counseling, Community Resources Provided -- -- --  Financial Strain Interventions Intervention Not Indicated Intervention Not Indicated Intervention Not Indicated Intervention Not Indicated Intervention Not Indicated Intervention Not Indicated  Physical Activity Interventions -- Intervention Not Indicated Intervention Not Indicated Intervention Not Indicated Intervention Not Indicated Intervention Not Indicated  Stress Interventions -- Intervention Not Indicated Community Resources Provided, Offered [employer], Provide Counseling Intervention Not Indicated Intervention Not Indicated Intervention Not Indicated  Social Connections Interventions -- Intervention Not Indicated Intervention Not Indicated, Community Resources Provided Intervention Not Indicated Intervention Not Indicated Intervention Not Indicated  Health Literacy Interventions -- Intervention Not Indicated Intervention Not Indicated Intervention Not Indicated Intervention Not Indicated --      Recommendation:   None  Follow Up Plan:   Telephone follow up appointment date/time:  03/07/24 at 10:30am  Tillman Gardener, BSW [place]  [facility], [facility] Social Worker Direct Dial: [phone]  Fax:  [phone] Website: [email or web address]

## 2024-02-24 NOTE — Addendum Note (Signed)
 Addended by: EDMAN MEADE PEDLAR on: 02/24/2024 07:55 PM   Modules accepted: Level of Service

## 2024-02-26 ENCOUNTER — Encounter: Payer: Self-pay | Admitting: Oncology

## 2024-03-04 ENCOUNTER — Telehealth: Payer: Self-pay | Admitting: *Deleted

## 2024-03-07 ENCOUNTER — Other Ambulatory Visit: Payer: Self-pay

## 2024-03-07 NOTE — Patient Instructions (Signed)

## 2024-03-07 NOTE — Patient Outreach (Signed)
 Complex Care Management   Visit Note  03/07/2024  Name:  Gloria Owens MRN: [id number] DOB: Apr 11, 1936  Situation: Referral received for Complex Care Management related to SDOH Barriers:  life alert system I obtained verbal consent from Patient.  Visit completed with Patient  on the phone  Background:   Past Medical History:  Diagnosis Date   Hypercholesteremia    Seizures (HCC)    with eye surgery several years ago-unknown etiology-no meds    Assessment:  Patient received her Life Alert system and has charged the device. SW assists patient with a test call to confirm the system is active. Patient is excited about having the alert system. Goal completed.  SDOH Interventions    Flowsheet Row Patient Outreach Telephone from 02/15/2024 in [place] POPULATION HEALTH DEPARTMENT Care Coordination from 07/29/2023 in [facility] Care Coordination Care Coordination from 06/16/2023 in [facility] Care Coordination Care Coordination from 04/16/2023 in [facility] Community Care Coordination Care Coordination from 12/17/2022 in [facility] [facility] Care Coordination Clinical Support from 11/24/2022 in [facility] [place] [facility]  SDOH Interventions        Food Insecurity Interventions Intervention Not Indicated  [friends and family helps] Intervention Not Indicated Intervention Not Indicated Intervention Not Indicated Intervention Not Indicated Intervention Not Indicated  Housing Interventions Intervention Not Indicated Intervention Not Indicated Intervention Not Indicated Intervention Not Indicated Intervention Not Indicated Intervention Not Indicated  Transportation Interventions Intervention Not Indicated  [Drives] Intervention Not Indicated, Patient Resources (Friends/Family) Intervention Not Indicated, Patient Resources (Friends/Family) Intervention Not Indicated, Patient Resources (Friends/Family) Intervention Not  Indicated, Patient Resources (Friends/Family) Intervention Not Indicated  Utilities Interventions Intervention Not Indicated Intervention Not Indicated Intervention Not Indicated Intervention Not Indicated Intervention Not Indicated Intervention Not Indicated  Alcohol Usage Interventions -- Intervention Not Indicated (Score <7) Intervention Not Indicated (Score <7) Intervention Not Indicated (Score <7) Intervention Not Indicated (Score <7) Intervention Not Indicated (Score <7)  Depression Interventions/Treatment  -- Medication, Counseling, Currently on Treatment Medication, Counseling, Community Resources Provided -- -- --  Financial Strain Interventions Intervention Not Indicated Intervention Not Indicated Intervention Not Indicated Intervention Not Indicated Intervention Not Indicated Intervention Not Indicated  Physical Activity Interventions -- Intervention Not Indicated Intervention Not Indicated Intervention Not Indicated Intervention Not Indicated Intervention Not Indicated  Stress Interventions -- Intervention Not Indicated Community Resources Provided, Offered [employer], Provide Counseling Intervention Not Indicated Intervention Not Indicated Intervention Not Indicated  Social Connections Interventions -- Intervention Not Indicated Intervention Not Indicated, Community Resources Provided Intervention Not Indicated Intervention Not Indicated Intervention Not Indicated  Health Literacy Interventions -- Intervention Not Indicated Intervention Not Indicated Intervention Not Indicated Intervention Not Indicated --      Recommendation:   None  Follow Up Plan:   Patient has met all care management goals. Care Management case will be closed. Patient has been provided contact information should new needs arise.   Tillman Gardener, BSW [place]  [facility], [facility] Social Worker Direct Dial: [phone]  Fax: [phone] Website: [email or web address]

## 2024-03-24 ENCOUNTER — Other Ambulatory Visit: Payer: Self-pay | Admitting: *Deleted

## 2024-03-24 NOTE — Patient Instructions (Signed)
 Visit Information  Thank you for taking time to visit with me today. Please don't hesitate to contact me if I can be of assistance to you before our next scheduled appointment.  Our next appointment is no further scheduled appointments.   Please call the care guide team at [phone] if you need to cancel or reschedule your appointment.   Please call the Suicide and Crisis Lifeline: 988 call the [place]  National Suicide Prevention Lifeline: (506)820-5096 or TTY: 3301240226 TTY 804-744-4625) to talk to a trained counselor call 1-800-273-TALK (toll free, 24 hour hotline) if you are experiencing a Mental Health or Behavioral Health Crisis or need someone to talk to.  Patient verbalized understanding of Care plan and visit instructions communicated this visit  Rosina Forte, BSN RN [facility], [facility] Health RN Care Manager Direct Dial: [phone]  Fax: [phone]

## 2024-03-24 NOTE — Patient Outreach (Signed)
 Complex Care Management   Visit Note  03/24/2024  Name:  Gloria Owens MRN: [id number] DOB: Aug 17, 1935  Situation: Referral received for Complex Care Management related to Falls I obtained verbal consent from Patient.  Visit completed with Patient  on the phone  Background:   Past Medical History:  Diagnosis Date   Hypercholesteremia    Seizures (HCC)    with eye surgery several years ago-unknown etiology-no meds    Assessment: Patient Reported Symptoms:  Cognitive Cognitive Status: Other: (Forgetfulness) Cognitive/Intellectual Conditions Management [RPT]: None reported or documented in medical history or problem list   Health Maintenance Behaviors: Annual physical exam Healing Pattern: Average Health Facilitated by: Rest, Prayer/meditation  Neurological Neurological Review of Symptoms: No symptoms reported Neurological Self-Management Outcome: 4 (good)  HEENT HEENT Symptoms Reported: No symptoms reported HEENT Management Strategies: Routine screening HEENT Self-Management Outcome: 4 (good)    Cardiovascular Cardiovascular Symptoms Reported: Fatigue Does patient have uncontrolled Hypertension?: No Cardiovascular Self-Management Outcome: 4 (good)  Respiratory Respiratory Symptoms Reported: No symptoms reported    Endocrine Endocrine Symptoms Reported: No symptoms reported Is patient diabetic?: No    Gastrointestinal Gastrointestinal Symptoms Reported: Constipation Gastrointestinal Self-Management Outcome: 4 (good)    Genitourinary Genitourinary Symptoms Reported: Incontinence Genitourinary Management Strategies: Incontinence garment/pad Genitourinary Self-Management Outcome: 4 (good)  Integumentary Integumentary Symptoms Reported: No symptoms reported    Musculoskeletal Musculoskelatal Symptoms Reviewed: Unsteady gait, Back pain, Joint pain Musculoskeletal Management Strategies: Routine screening Musculoskeletal Self-Management Outcome: 4 (good) Falls in the past  year?: Yes Number of falls in past year: 1 or less Was there an injury with Fall?: No Fall Risk Category Calculator: 1 Patient Fall Risk Level: Low Fall Risk Patient at Risk for Falls Due to: History of fall(s), Impaired balance/gait Fall risk Follow up: Falls evaluation completed, Education provided  Psychosocial Psychosocial Symptoms Reported: Sadness - if selected complete PHQ 2-9, Report of significant loss, deaths, abandonment, traumatic incidents Behavioral Management Strategies: Coping strategies Behavioral Health Self-Management Outcome: 3 (uncertain) Major Change/Loss/Stressor/Fears (CP): Denies Techniques to Cope with Loss/Stress/Change: Diversional activities Quality of Family Relationships: non-existent Do you feel physically threatened by others?: No    03/24/2024    PHQ2-9 Depression Screening   Little interest or pleasure in doing things Not at all  Feeling down, depressed, or hopeless Not at all  PHQ-2 - Total Score 0  Trouble falling or staying asleep, or sleeping too much    Feeling tired or having little energy    Poor appetite or overeating     Feeling bad about yourself - or that you are a failure or have let yourself or your family down    Trouble concentrating on things, such as reading the newspaper or watching television    Moving or speaking so slowly that other people could have noticed.  Or the opposite - being so fidgety or restless that you have been moving around a lot more than usual    Thoughts that you would be better off dead, or hurting yourself in some way    PHQ2-9 Total Score    If you checked off any problems, how difficult have these problems made it for you to do your work, take care of things at home, or get along with other people    Depression Interventions/Treatment      There were no vitals filed for this visit.  Medications Reviewed Today     Reviewed by Bertrum Rosina HERO, RN (Registered Nurse) on 03/24/24 at 1042  Med List Status:  <None>  Medication Order Taking? Sig Documenting Provider Last Dose Status Informant  cholecalciferol  (VITAMIN D3) 25 MCG (1000 UNIT) tablet [id number] Yes Take 2,000 Units by mouth daily. [provider]  Active   feeding supplement, GLUCERNA SHAKE, (GLUCERNA SHAKE) LIQD [id number] Yes Take 237 mLs by mouth 3 (three) times daily between meals. Bacchus, Meade PEDLAR, FNP  Active   Iron , Ferrous Sulfate , 325 (65 Fe) MG TABS [id number] Yes Take 325 mg by mouth daily. Edman Meade PEDLAR, FNP  Active   metFORMIN  (GLUCOPHAGE ) 500 MG tablet [id number] Yes Take 1 tablet (500 mg total) by mouth 2 (two) times daily with a meal. Bacchus, Meade PEDLAR, FNP  Active   simvastatin  (ZOCOR ) 40 MG tablet [id number] Yes Take 1 tablet (40 mg total) by mouth daily. Edman Meade PEDLAR, FNP  Active             Recommendation:   Continue Current Plan of Care  Follow Up Plan:   Closing From:  Complex Care Management Patient has met all care management goals. Care Management case will be closed. Patient has been provided contact information should new needs arise.   Rosina Forte, BSN RN [facility], [facility] RN Care Manager Direct Dial: [phone]  Fax: [phone]

## 2024-04-26 ENCOUNTER — Inpatient Hospital Stay: Attending: Physician Assistant

## 2024-04-26 DIAGNOSIS — D509 Iron deficiency anemia, unspecified: Secondary | ICD-10-CM | POA: Insufficient documentation

## 2024-04-26 DIAGNOSIS — D472 Monoclonal gammopathy: Secondary | ICD-10-CM | POA: Insufficient documentation

## 2024-04-26 DIAGNOSIS — R7689 Other specified abnormal immunological findings in serum: Secondary | ICD-10-CM

## 2024-04-26 DIAGNOSIS — D508 Other iron deficiency anemias: Secondary | ICD-10-CM

## 2024-04-26 LAB — COMPREHENSIVE METABOLIC PANEL WITH GFR
ALT: 15 U/L (ref 0–44)
AST: 21 U/L (ref 15–41)
Albumin: 4.7 g/dL (ref 3.5–5.0)
Alkaline Phosphatase: 66 U/L (ref 38–126)
Anion gap: 13 (ref 5–15)
BUN: 14 mg/dL (ref 8–23)
CO2: 25 mmol/L (ref 22–32)
Calcium: 9.6 mg/dL (ref 8.9–10.3)
Chloride: 100 mmol/L (ref 98–111)
Creatinine, Ser: 0.6 mg/dL (ref 0.44–1.00)
GFR, Estimated: >60 mL/min (ref >60)
Glucose, Bld: 106 mg/dL — ABNORMAL HIGH (ref 70–99)
Potassium: 4 mmol/L (ref 3.5–5.1)
Sodium: 137 mmol/L (ref 135–145)
Total Bilirubin: 0.5 mg/dL (ref 0.0–1.2)
Total Protein: 7.2 g/dL (ref 6.5–8.1)

## 2024-04-26 LAB — CBC WITH DIFFERENTIAL/PLATELET
Abs Immature Granulocytes: 0.01 K/uL (ref 0.00–0.07)
Basophils Absolute: 0 K/uL (ref 0.0–0.1)
Basophils Relative: 0 %
Eosinophils Absolute: 0.1 K/uL (ref 0.0–0.5)
Eosinophils Relative: 2 %
HCT: 41.1 % (ref 36.0–46.0)
Hemoglobin: 14 g/dL (ref 12.0–15.0)
Immature Granulocytes: 0 %
Lymphocytes Relative: 25 %
Lymphs Abs: 1.4 K/uL (ref 0.7–4.0)
MCH: 33.8 pg (ref 26.0–34.0)
MCHC: 34.1 g/dL (ref 30.0–36.0)
MCV: 99.3 fL (ref 80.0–100.0)
Monocytes Absolute: 0.5 K/uL (ref 0.1–1.0)
Monocytes Relative: 10 %
Neutro Abs: 3.5 K/uL (ref 1.7–7.7)
Neutrophils Relative %: 63 %
Platelets: 155 K/uL (ref 150–400)
RBC: 4.14 MIL/uL (ref 3.87–5.11)
RDW: 12.3 % (ref 11.5–15.5)
WBC: 5.6 K/uL (ref 4.0–10.5)
nRBC: 0 % (ref 0.0–0.2)

## 2024-04-26 LAB — IRON AND TIBC
Iron: 74 ug/dL (ref 28–170)
Saturation Ratios: 23 % (ref 10.4–31.8)
TIBC: 323 ug/dL (ref 250–450)
UIBC: 250 ug/dL

## 2024-04-26 LAB — FERRITIN: Ferritin: 244 ng/mL (ref 11–307)

## 2024-04-26 LAB — LACTATE DEHYDROGENASE: LDH: 142 U/L (ref 105–235)

## 2024-04-27 LAB — KAPPA/LAMBDA LIGHT CHAINS
Kappa free light chain: 354.8 mg/L — ABNORMAL HIGH (ref 3.3–19.4)
Kappa, lambda light chain ratio: 26.48 — ABNORMAL HIGH (ref 0.26–1.65)
Lambda free light chains: 13.4 mg/L (ref 5.7–26.3)

## 2024-04-28 LAB — PROTEIN ELECTROPHORESIS, SERUM
A/G Ratio: 1.2 (ref 0.7–1.7)
Albumin ELP: 3.8 g/dL (ref 2.9–4.4)
Alpha-1-Globulin: 0.2 g/dL (ref 0.0–0.4)
Alpha-2-Globulin: 0.9 g/dL (ref 0.4–1.0)
Beta Globulin: 1.1 g/dL (ref 0.7–1.3)
Gamma Globulin: 0.9 g/dL (ref 0.4–1.8)
Globulin, Total: 3.1 g/dL (ref 2.2–3.9)
M-Spike, %: 0.3 g/dL — ABNORMAL HIGH
Total Protein ELP: 6.9 g/dL (ref 6.0–8.5)

## 2024-04-28 LAB — IMMUNOFIXATION ELECTROPHORESIS
IgA: 146 mg/dL (ref 64–422)
IgG (Immunoglobin G), Serum: 916 mg/dL (ref 586–1602)
IgM (Immunoglobulin M), Srm: 71 mg/dL (ref 26–217)
Total Protein ELP: 6.9 g/dL (ref 6.0–8.5)

## 2024-05-02 NOTE — Progress Notes (Unsigned)
 [facility] [address], [place] [address]   CLINIC:  Medical Oncology/Hematology  PCP:  Edman Meade PEDLAR, FNP [address] [place] [address] [phone]   REASON FOR VISIT:  Follow-up for iron  deficiency anemia and MGUS   CURRENT THERAPY: Intermittent IV iron  + daily iron  tablet  INTERVAL HISTORY:  Gloria Owens is seen today for follow-up of iron  deficiency anemia and MGUS.   She was last seen by Gloria Barefoot PA-C on 10/27/2023.  At today's visit, she reports feeling fair. She reports some increased feelings of sadness at this time of year, due to losing her brother about a year ago in January 2025.  She also lost her husband around this time of year. She has 50% energy and 25% appetite.    She has lost 5 pounds since her visit 6 months ago, reports decreased appetite.  IRON  DEFICIENCY ANEMIA: She reports baseline energy with fatigue that waxes and wanes. Reports some dark stool from iron  supplementation, but no overt melena. Scant hemorrhoid bleeding 2 weeks ago, no gross hematochezia. No recurrent ice pica. She has occasional headaches at baseline. She denies any chest pain, dyspnea on exertion, lightheadedness, or syncope.   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 She denies any B symptoms. She has been previously found to have sigmoid colon stenosis and experiences some chronic constipation related to this.  ASSESSMENT & PLAN:  1.  Severe iron  deficiency anemia: - Labs from 10/27/2022 showed hemoglobin 9, ferritin 9, iron  saturation 4% - History of PRBC transfusions required at the time of back surgeries - Additional hematology workup (12/18/2022) showed normal B12, folate, MMA, copper . - Taking iron  tablet daily since June 2024  - Most recent IV iron  with Feraheme x 1 in June 2025 - No prior EGD.   Patient denies any recent colonoscopies - reports exploratory laparotomy in 1967, found to have extensive endometriosis.  Exploratory laparotomy in 1970 with total abdominal hysterectomy and sigmoid resection with reanastomosis.  Details of procedure are not available.  Patient reports that she previously had attempts at colonoscopy, which were unsuccessful due to stenosis, reports that sigmoid colon is no larger than an English pea and therefore evaluated by barium enemas only - Barium enema of colon (03/28/2022): Diverticulosis, no evidence of malignancy. - CT abdomen/pelvis (12/12/2022): No evidence of stomach or bowel malignancy, but she does have extensive colonic diverticulosis.  No pathologically enlarged lymph nodes.  Additional findings including bilateral adnexal cysts are being worked up by her PCP. - Reports dark stool after starting iron  tablets, but denies any gross rectal bleeding or melena - Most recent labs (04/26/2024): Hgb 14.0, ferritin 244, iron  saturation 23% - PLAN: No indication for IV iron  at this time. - Continue EOD iron  supplement with stool softener - Recheck in 6 months and consider additional testing (i.e. stool cards and referral to GI for EGD/barium enema of colon) if recurrent anemia.   2.  IgA kappa light chain MGUS - Workup of anemia revealed significantly elevated kappa free light chain 265.2, with normal lambda free light chain 9.7, elevated FLC ratio 27.34.  SPEP was negative. - Additional labs (12/31/2022): Immunofixation = faint band in IgA, light chain specificity unable to be verified. Creatinine 0.68, calcium  9.5, Hgb 12.9. Beta-2  microglobulin 1.9, LDH 118. - 24-hour urine/UPEP (01/06/2023): Urine immunofixation negative.  Urine M spike not  observed. - Updated 24-hour urine/UPEP from 04/26/2024 is PENDING. - Skeletal survey (12/31/2022): No lytic or sclerotic lesions identified. - Most recent MGUS/myeloma panel (04/26/2024): Immunofixation shows IgA kappa  monoclonal gammopathy While previously negative, SPEP now shows M spike of 0.3%.   Elevated FLC ratio 26.48.  (354.8, lambda 13.4). Normal LDH.  Creatinine 0.60, calcium  9.6, Hgb 14.0 - She denies any neurologic changes, B symptoms, or new onset bone pain - RISK STRATIFICATION  Per IMWG = HIGH-INTERMEDIATE RISK (2/3 risk factors) = (+) Non-IgG M protein, (-) M spike > 1.5, (+) elevated FLC ratio >> (37% risk of progression to MM over 20 years) Per iStopMM Criteria - Patient has 25.2% risk of having >10% bone marrow plasma cells - We have discussed diagnosis of MGUS and need for ongoing monitoring due to risk of progression to multiple myeloma. - PLAN: Repeat MGUS/myeloma panel in 6 months - No indication for BMBx or PET scan at this time. - Consider repeat skeletal survey or urine study if any major changes in serum labs or symptoms.     3.  Social/family history: - Lives by herself at home.  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 - She is able to do all her ADLs and also mows her [place].5 acres land.  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  Non-smoker.  No chemical exposure. - 4 brothers had lung cancer.  1 of those brothers also had leukemia.  1 sister had pancreatic cancer.  Niece and nephew had cancer.   PLAN SUMMARY: >> Labs in 6 months = CBC/D, CMP, SPEP, light chains, immunofixation, LDH, ferritin, iron /TIBC >> OFFICE visit in 6 months (1 week after labs)     REVIEW OF SYSTEMS:  Review of Systems  Constitutional:  Positive for fatigue. Negative for appetite change, chills, diaphoresis, fever and unexpected weight change.  HENT:   Negative for lump/mass and nosebleeds.   Eyes:  Negative for eye problems.  Respiratory:  Negative for cough, hemoptysis and shortness of breath.   Cardiovascular:  Negative for chest pain, leg swelling and palpitations.  Gastrointestinal:  Positive for constipation and diarrhea. Negative for abdominal pain,  blood in stool, nausea and vomiting.  Genitourinary:  Negative for hematuria.   Musculoskeletal:  Positive for back pain.  Skin: Negative.   Neurological:  Positive for headaches. Negative for dizziness and light-headedness.  Hematological:  Does not bruise/bleed easily.  Psychiatric/Behavioral:  Positive for depression and sleep disturbance.      PHYSICAL EXAM:  ECOG PERFORMANCE STATUS: 1 - Symptomatic but completely ambulatory  Vitals:   05/03/24 1322 05/03/24 1327  BP: (!) 161/69 136/68  Pulse: 79   Resp: 20   Temp: 98.3 F (36.8 C)   SpO2: 96%     Filed Weights   05/03/24 1322  Weight: 133 lb (60.3 kg)    Physical Exam Constitutional:      Appearance: Normal appearance. She is normal weight.  Cardiovascular:     Heart sounds: Normal heart sounds.  Pulmonary:     Breath sounds: Normal breath sounds.  Neurological:     General: No focal deficit present.     Mental Status: Mental status is at baseline.  Psychiatric:        Behavior: Behavior normal. Behavior is cooperative.     PAST MEDICAL/SURGICAL HISTORY:  Past Medical History:  Diagnosis Date   Hypercholesteremia    Seizures (HCC)    with eye surgery several years ago-unknown etiology-no  meds   Past Surgical History:  Procedure Laterality Date   ABDOMINAL HYSTERECTOMY     total hysterectomy   BACK SURGERY     fusion of back x2 and 1 other back surgery   CHOLECYSTECTOMY     COLON RESECTION     from endometriosis   CYSTOSCOPY     fulgeration of tumors   HERNIA REPAIR Right    INGUINAL HERNIA REPAIR Left 06/20/2013   Procedure: HERNIA REPAIR INGUINAL ADULT;  Surgeon: Oneil DELENA Budge, MD;  Location: [facility];  Service: General;  Laterality: Left;   INSERTION OF MESH Left 06/20/2013   Procedure: INSERTION OF MESH;  Surgeon: Oneil DELENA Budge, MD;  Location: [facility];  Service: General;  Laterality: Left;    SOCIAL HISTORY:  Social History   Socioeconomic History   Marital status: Widowed    Spouse name:  Not on file   Number of children: 0   Years of education: 76   Highest education level: 12th grade  Occupational History   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  Tobacco Use   Smoking status: Never    Passive exposure: Never   Smokeless tobacco: Never  Vaping Use   Vaping status: Never Used  Substance and Sexual Activity   Alcohol use: No   Drug use: No   Sexual activity: Not Currently    Partners: Male    Birth control/protection: Surgical  Other Topics Concern   Not on file  Social History Narrative   No children- had endometriosis an dhysterectomy; husband recently deceased   Nephew is Gustavo Meditz- security guard at [employer]      11/24/22 patient caring for 1 brother who has Alzheimer and another brother is in a facility   Social Drivers of Health   Financial Resource Strain: Low Risk  (02/15/2024)   Overall Financial Resource Strain (CARDIA)    Difficulty of Paying Living Expenses: Not hard at all  Food Insecurity: No Food Insecurity (03/24/2024)   Hunger Vital Sign    Worried About Running Out of Food in the Last Year: Never true    Ran Out of Food in the Last Year: Never true  Transportation Needs: No Transportation Needs (03/24/2024)   PRAPARE - Administrator, Civil Service (Medical): No    Lack of Transportation (Non-Medical): No  Physical Activity: Sufficiently Active (07/29/2023)   Exercise Vital Sign    Days of Exercise per Week: 7 days    Minutes of Exercise per Session: 50 min  Stress: No Stress Concern Present (07/29/2023)   [employer] of Occupational Health - Occupational Stress Questionnaire    Feeling of Stress : Only a little  Recent Concern: Stress - Stress Concern Present (06/16/2023)   [employer] of Occupational Health - Occupational Stress Questionnaire    Feeling of Stress : To some extent  Social Connections: Moderately Integrated (07/29/2023)   Social Connection and Isolation Panel    Frequency of  Communication with Friends and Family: More than three times a week    Frequency of Social Gatherings with Friends and Family: More than three times a week    Attends Religious Services: More than 4 times per year    Active Member of [employer] or Organizations: Yes    Attends Banker Meetings: More than 4 times per year    Marital Status: Widowed  Intimate Partner Violence: Not At Risk (03/24/2024)   Humiliation, Afraid, Rape, and Kick questionnaire  Fear of Current or Ex-Partner: No    Emotionally Abused: No    Physically Abused: No    Sexually Abused: No    FAMILY HISTORY:  No family history on file.  CURRENT MEDICATIONS:  Outpatient Encounter Medications as of 05/03/2024  Medication Sig   cholecalciferol  (VITAMIN D3) 25 MCG (1000 UNIT) tablet Take 2,000 Units by mouth daily.   feeding supplement, GLUCERNA SHAKE, (GLUCERNA SHAKE) LIQD Take 237 mLs by mouth 3 (three) times daily between meals.   Iron , Ferrous Sulfate , 325 (65 Fe) MG TABS Take 325 mg by mouth daily.   metFORMIN  (GLUCOPHAGE ) 500 MG tablet Take 1 tablet (500 mg total) by mouth 2 (two) times daily with a meal.   simvastatin  (ZOCOR ) 40 MG tablet Take 1 tablet (40 mg total) by mouth daily.   No facility-administered encounter medications on file as of 05/03/2024.    ALLERGIES:  Allergies  Allergen Reactions   Penicillins     LABORATORY DATA:  I have reviewed the labs as listed.  CBC    Component Value Date/Time   WBC 5.6 04/26/2024 1426   RBC 4.14 04/26/2024 1426   HGB 14.0 04/26/2024 1426   HGB 14.2 02/15/2024 0836   HCT 41.1 04/26/2024 1426   HCT 43.1 02/15/2024 0836   PLT 155 04/26/2024 1426   PLT 134 (L) 02/15/2024 0836   MCV 99.3 04/26/2024 1426   MCV 102 (H) 02/15/2024 0836   MCH 33.8 04/26/2024 1426   MCHC 34.1 04/26/2024 1426   RDW 12.3 04/26/2024 1426   RDW 12.0 02/15/2024 0836   LYMPHSABS 1.4 04/26/2024 1426   LYMPHSABS 1.2 02/15/2024 0836   MONOABS 0.5 04/26/2024 1426   EOSABS  0.1 04/26/2024 1426   EOSABS 0.1 02/15/2024 0836   BASOSABS 0.0 04/26/2024 1426   BASOSABS 0.0 02/15/2024 0836      Latest Ref Rng & Units 04/26/2024    2:26 PM 02/15/2024    8:36 AM 10/20/2023   10:02 AM  CMP  Glucose 70 - 99 mg/dL 893  820  849   BUN 8 - 23 mg/dL 14  13  25    Creatinine 0.44 - 1.00 mg/dL 9.39  9.25  9.24   Sodium 135 - 145 mmol/L 137  140  135   Potassium 3.5 - 5.1 mmol/L 4.0  4.2  3.7   Chloride 98 - 111 mmol/L 100  101  101   CO2 22 - 32 mmol/L 25  24  23    Calcium  8.9 - 10.3 mg/dL 9.6  9.8  9.5   Total Protein 6.5 - 8.1 g/dL 7.2  6.5  7.5   Total Bilirubin 0.0 - 1.2 mg/dL 0.5  0.7  0.7   Alkaline Phos 38 - 126 U/L 66  67  53   AST 15 - 41 U/L 21  15  18    ALT 0 - 44 U/L 15  15  16      DIAGNOSTIC IMAGING:  I have independently reviewed the relevant imaging and discussed with the patient.   WRAP UP:  All questions were answered. The patient knows to call the clinic with any problems, questions or concerns.  Medical decision making: Moderate  Time spent on visit: I spent 20 minutes counseling the patient face to face. The total time spent in the appointment was 30 minutes and more than 50% was on counseling.  Gloria CHRISTELLA Barefoot, PA-C  05/03/24 2:17 PM

## 2024-05-03 ENCOUNTER — Inpatient Hospital Stay: Admitting: Physician Assistant

## 2024-05-03 VITALS — BP 136/68 | HR 79 | Temp 98.3°F | Resp 20 | Wt 133.0 lb

## 2024-05-03 DIAGNOSIS — D472 Monoclonal gammopathy: Secondary | ICD-10-CM

## 2024-05-03 DIAGNOSIS — D508 Other iron deficiency anemias: Secondary | ICD-10-CM | POA: Diagnosis not present

## 2024-05-03 DIAGNOSIS — D509 Iron deficiency anemia, unspecified: Secondary | ICD-10-CM | POA: Diagnosis not present

## 2024-05-03 DIAGNOSIS — R7689 Other specified abnormal immunological findings in serum: Secondary | ICD-10-CM | POA: Diagnosis not present

## 2024-05-03 NOTE — Patient Instructions (Signed)
 [facility] at [facility] **VISIT SUMMARY & IMPORTANT INSTRUCTIONS **   You were seen today by Pleasant Barefoot PA-C for your follow up visit.    ANEMIA: Your blood and iron  levels look great!   You do not need IV iron  at this time. You should continue to take your iron  pill EVERY OTHER DAY.    MGUS (Monoclonal Gammopathy of Undetermined Significance): As we discussed, this is a precancerous condition in which you have increased immunoglobulin proteins in your blood.  This can progress to a type of cancer called multiple myeloma.  At this time, you do NOT have multiple myeloma cancer.  We will recheck your labs in 6 months to make sure things are staying stable.  FOLLOW-UP APPOINTMENT: Lab and office visit in 6 months  ** Thank you for trusting me with your healthcare!  I strive to provide all of my patients with quality care at each visit.  If you receive a survey for this visit, I would be so grateful to you for taking the time to provide feedback.  Thank you in advance!  ~ Wilson Dusenbery                                        Dr. Mickiel Davonna Pleasant Barefoot, PA-C      Delon Hope, NP   - - - - - - - - - - - - - - - - - -     Thank you for choosing [facility] at [facility] to provide your oncology and hematology care.  To afford each patient quality time with our provider, please arrive at least 15 minutes before your scheduled appointment time.   If you have a lab appointment with the Cancer Center please come in thru the Main Entrance and check in at the main information desk.  You need to re-schedule your appointment should you arrive 10 or more minutes late.  We strive to give you quality time with our providers, and arriving late affects you and other patients whose appointments are after yours.  Also, if you no show three or more times for appointments you may be dismissed from the clinic at the providers discretion.      Again, thank you for choosing [facility].  Our hope is that these requests will decrease the amount of time that you wait before being seen by our physicians.       _____________________________________________________________  Should you have questions after your visit to [facility], please contact our office at [phone] and follow the prompts.  Our office hours are 8:00 a.m. and 4:30 p.m. Monday - Friday.  Please note that voicemails left after 4:00 p.m. may not be returned until the following business day.  We are closed weekends and major holidays.  You do have access to a nurse 24-7, just call the main number to the clinic [phone] and do not press any options, hold on the line and a nurse will answer the phone.    For prescription refill requests, have your pharmacy contact our office and allow 72 hours.

## 2024-05-04 LAB — UPEP/UIFE/LIGHT CHAINS/TP, 24-HR UR
% BETA, Urine: 26.1 %
ALPHA 1 URINE: 6.4 %
Albumin, U: 37.8 %
Alpha 2, Urine: 18.4 %
Free Kappa Lt Chains,Ur: 9.6 mg/L (ref 1.17–86.46)
Free Kappa/Lambda Ratio: 6.96 (ref 1.83–14.26)
Free Lambda Lt Chains,Ur: 1.38 mg/L (ref 0.27–15.21)
GAMMA GLOBULIN URINE: 11.3 %
Total Protein, Urine-Ur/day: 282 mg/(24.h) — ABNORMAL HIGH (ref 30–150)
Total Protein, Urine: 16.6 mg/dL
Total Volume: 1700

## 2024-05-23 ENCOUNTER — Encounter: Payer: Self-pay | Admitting: *Deleted

## 2024-06-13 ENCOUNTER — Ambulatory Visit: Admitting: Family Medicine

## 2024-06-14 ENCOUNTER — Ambulatory Visit: Payer: Self-pay

## 2024-06-14 ENCOUNTER — Other Ambulatory Visit: Payer: Self-pay

## 2024-06-14 ENCOUNTER — Telehealth: Payer: Self-pay

## 2024-06-14 DIAGNOSIS — E1165 Type 2 diabetes mellitus with hyperglycemia: Secondary | ICD-10-CM

## 2024-06-14 DIAGNOSIS — E119 Type 2 diabetes mellitus without complications: Secondary | ICD-10-CM

## 2024-06-14 DIAGNOSIS — E7849 Other hyperlipidemia: Secondary | ICD-10-CM

## 2024-06-14 MED ORDER — SIMVASTATIN 40 MG PO TABS: 40.0000 mg | ORAL_TABLET | Freq: Every day | ORAL | 0 refills | Status: AC

## 2024-06-14 MED ORDER — METFORMIN HCL 500 MG PO TABS: 500.0000 mg | ORAL_TABLET | Freq: Two times a day (BID) | ORAL | 0 refills | Status: AC

## 2024-06-14 NOTE — Telephone Encounter (Signed)
 Called patient unable to come into office due to diarrhea and does not do video visit. She said she feels fine all except for the diarrhea.

## 2024-06-14 NOTE — Telephone Encounter (Signed)
 FYI Only or Action Required?: Action required by provider: request for appointment.  Patient was last seen in primary care on 02/11/2024 by Edman Meade PEDLAR, FNP.  Called Nurse Triage reporting Diarrhea.  Symptoms began today.  Interventions attempted: Rest, hydration, or home remedies.  Symptoms are: stable.  Triage Disposition: Home Care  Patient/caregiver understands and will follow disposition?: Yes  Reason for Disposition  MILD-MODERATE diarrhea (e.g., 1-6 times / day more than normal)  Answer Assessment - Initial Assessment Questions Pt reports having major surgery on her colon. For the last year, every so often diarrhea occurs, most times lasts a whole day and goes away on its own. Denies being evaluated by a doctor for this before. Believes Metformin  also has something to do with it. Pt canceled f/u appointment today with Leita, no available appts with Leita until April, unsure if pt is able to see another provider in office for f/u. Please advise. Call back to schedule [phone]  1. DIARRHEA SEVERITY: How bad is the diarrhea? How many more stools have you had in the past 24 hours than normal?      3 times since waking up this morning  2. ONSET: When did the diarrhea begin?      Today  3. STOOL DESCRIPTION:  How loose or watery is the diarrhea? What is the stool color? Is there any blood or mucous in the stool?     Watery and loose  4. VOMITING: Are you also vomiting? If Yes, ask: How many times in the past 24 hours?      Denies  5. ABDOMEN PAIN: Are you having any abdomen pain? If Yes, ask: What does it feel like? (e.g., crampy, dull, intermittent, constant)      Denies  6. ABDOMEN PAIN SEVERITY: If present, ask: How bad is the pain?  (e.g., Scale 1-10; mild, moderate, or severe)     N/A  7. ORAL INTAKE: If vomiting, Have you been able to drink liquids? How much liquids have you had in the past 24 hours?     Drinking lots of water, ate  cheerios and a banana this morning  8. HYDRATION: Any signs of dehydration? (e.g., dry mouth [not just dry lips], too weak to stand, dizziness, new weight loss) When did you last urinate?     Denies  9. ANTIBIOTIC USE: Are you taking antibiotics now or have you taken antibiotics in the past 2 months?       Denies  10. OTHER SYMPTOMS: Do you have any other symptoms? (e.g., fever, blood in stool)       Mild nausea  Protocols used: [facility]   Message from Helena F sent at 06/14/2024  8:16 AM EST  Reason for Triage: Pt is having severe diarrhea and nausea. She had appointment today but had to cancel due to not wanting to get anyone sick. Call back number [phone].

## 2024-06-14 NOTE — Telephone Encounter (Signed)
 Copied from CRM [id number]. Topic: Clinical - Medication Refill >> Jun 14, 2024  8:12 AM Eva FALCON wrote: Medication:  simvastatin  (ZOCOR ) 40 MG tablet (Expired) metFORMIN  (GLUCOPHAGE ) 500 MG tablet  Has the patient contacted their pharmacy? No, bottle states no refills (Agent: If no, request that the patient contact the pharmacy for the refill. If patient does not wish to contact the pharmacy document the reason why and proceed with request.) (Agent: If yes, when and what did the pharmacy advise?)  This is the patient's preferred pharmacy:  [facility] [place], [place] - [id number] [place] [address] Phone: [phone] Fax: [phone]  Is this the correct pharmacy for this prescription? Yes If no, delete pharmacy and type the correct one.   Has the prescription been filled recently? No  Is the patient out of the medication? No  Has the patient been seen for an appointment in the last year OR does the patient have an upcoming appointment? No  Can we respond through MyChart? No  Agent: Please be advised that Rx refills may take up to 3 business days. We ask that you follow-up with your pharmacy.

## 2024-06-14 NOTE — Telephone Encounter (Signed)
 Refills sent to pharmacy.

## 2024-08-31 ENCOUNTER — Ambulatory Visit: Admitting: Urology

## 2024-10-25 ENCOUNTER — Inpatient Hospital Stay

## 2024-11-01 ENCOUNTER — Inpatient Hospital Stay: Admitting: Physician Assistant
